# Patient Record
Sex: Female | Born: 1950 | Race: Black or African American | Hispanic: No | Smoking: Never smoker
Health system: Southern US, Community
[De-identification: ages and names within clinical notes are randomized; demographics above are authoritative.]

## PROBLEM LIST (undated history)

## (undated) DIAGNOSIS — M1990 Unspecified osteoarthritis, unspecified site: Secondary | ICD-10-CM

## (undated) DIAGNOSIS — R251 Tremor, unspecified: Secondary | ICD-10-CM

## (undated) DIAGNOSIS — N189 Chronic kidney disease, unspecified: Secondary | ICD-10-CM

## (undated) DIAGNOSIS — I10 Essential (primary) hypertension: Secondary | ICD-10-CM

## (undated) HISTORY — PX: VAGINAL HYSTERECTOMY: SUR661

## (undated) HISTORY — DX: Unspecified osteoarthritis, unspecified site: M19.90

## (undated) HISTORY — DX: Chronic kidney disease, unspecified: N18.9

## (undated) HISTORY — PX: JOINT REPLACEMENT: SHX530

## (undated) HISTORY — PX: REPLACEMENT UNICONDYLAR JOINT KNEE: SUR1227

## (undated) HISTORY — PX: FOOT SURGERY: SHX648

## (undated) HISTORY — PX: CARPAL TUNNEL RELEASE: SHX101

## (undated) HISTORY — DX: Tremor, unspecified: R25.1

## (undated) HISTORY — DX: Essential (primary) hypertension: I10

---

## 1997-07-03 ENCOUNTER — Ambulatory Visit (HOSPITAL_COMMUNITY): Admission: RE | Admit: 1997-07-03 | Discharge: 1997-07-03 | Payer: Self-pay | Admitting: Unknown Physician Specialty

## 1997-12-27 ENCOUNTER — Inpatient Hospital Stay (HOSPITAL_COMMUNITY): Admission: AD | Admit: 1997-12-27 | Discharge: 1997-12-28 | Payer: Self-pay | Admitting: Cardiology

## 2008-05-01 ENCOUNTER — Ambulatory Visit: Payer: Self-pay | Admitting: Pulmonary Disease

## 2008-05-01 ENCOUNTER — Ambulatory Visit: Payer: Self-pay | Admitting: Internal Medicine

## 2008-05-01 ENCOUNTER — Inpatient Hospital Stay (HOSPITAL_COMMUNITY): Admission: AD | Admit: 2008-05-01 | Discharge: 2008-05-05 | Payer: Self-pay | Admitting: Pulmonary Disease

## 2008-05-02 ENCOUNTER — Ambulatory Visit: Payer: Self-pay | Admitting: *Deleted

## 2010-04-30 LAB — RETICULOCYTES
RBC.: 2.95 MIL/uL — ABNORMAL LOW (ref 3.87–5.11)
RBC.: 2.96 MIL/uL — ABNORMAL LOW (ref 3.87–5.11)
Retic Count, Absolute: 29.5 10*3/uL (ref 19.0–186.0)
Retic Count, Absolute: 44.4 10*3/uL (ref 19.0–186.0)
Retic Ct Pct: 1 % (ref 0.4–3.1)
Retic Ct Pct: 1.5 % (ref 0.4–3.1)

## 2010-04-30 LAB — CBC
HCT: 23.3 % — ABNORMAL LOW (ref 36.0–46.0)
HCT: 23.6 % — ABNORMAL LOW (ref 36.0–46.0)
HCT: 25.8 % — ABNORMAL LOW (ref 36.0–46.0)
HCT: 26 % — ABNORMAL LOW (ref 36.0–46.0)
HCT: 26 % — ABNORMAL LOW (ref 36.0–46.0)
Hemoglobin: 8.3 g/dL — ABNORMAL LOW (ref 12.0–15.0)
Hemoglobin: 8.4 g/dL — ABNORMAL LOW (ref 12.0–15.0)
Hemoglobin: 9 g/dL — ABNORMAL LOW (ref 12.0–15.0)
Hemoglobin: 9.3 g/dL — ABNORMAL LOW (ref 12.0–15.0)
Hemoglobin: 9.3 g/dL — ABNORMAL LOW (ref 12.0–15.0)
MCHC: 35.5 g/dL (ref 30.0–36.0)
MCHC: 35.6 g/dL (ref 30.0–36.0)
MCHC: 35 g/dL (ref 30.0–36.0)
MCHC: 35.6 g/dL (ref 30.0–36.0)
MCHC: 35.7 g/dL (ref 30.0–36.0)
MCV: 92.4 fL (ref 78.0–100.0)
MCV: 92.7 fL (ref 78.0–100.0)
MCV: 92.1 fL (ref 78.0–100.0)
MCV: 92.7 fL (ref 78.0–100.0)
MCV: 93.1 fL (ref 78.0–100.0)
Platelets: 156 10*3/uL (ref 150–400)
Platelets: 245 10*3/uL (ref 150–400)
Platelets: 22 10*3/uL — CL (ref 150–400)
Platelets: 60 10*3/uL — ABNORMAL LOW (ref 150–400)
Platelets: 96 10*3/uL — ABNORMAL LOW (ref 150–400)
RBC: 2.53 MIL/uL — ABNORMAL LOW (ref 3.87–5.11)
RBC: 2.55 MIL/uL — ABNORMAL LOW (ref 3.87–5.11)
RBC: 2.77 MIL/uL — ABNORMAL LOW (ref 3.87–5.11)
RBC: 2.8 MIL/uL — ABNORMAL LOW (ref 3.87–5.11)
RBC: 2.83 MIL/uL — ABNORMAL LOW (ref 3.87–5.11)
RDW: 13.7 % (ref 11.5–15.5)
RDW: 14.3 % (ref 11.5–15.5)
RDW: 13.8 % (ref 11.5–15.5)
RDW: 13.9 % (ref 11.5–15.5)
RDW: 14.2 % (ref 11.5–15.5)
WBC: 10.6 10*3/uL — ABNORMAL HIGH (ref 4.0–10.5)
WBC: 10.8 10*3/uL — ABNORMAL HIGH (ref 4.0–10.5)
WBC: 11.6 10*3/uL — ABNORMAL HIGH (ref 4.0–10.5)
WBC: 11.7 10*3/uL — ABNORMAL HIGH (ref 4.0–10.5)
WBC: 12 10*3/uL — ABNORMAL HIGH (ref 4.0–10.5)

## 2010-04-30 LAB — COMPREHENSIVE METABOLIC PANEL
ALT: 18 U/L (ref 0–35)
ALT: 27 U/L (ref 0–35)
ALT: 19 U/L (ref 0–35)
ALT: 26 U/L (ref 0–35)
ALT: 28 U/L (ref 0–35)
AST: 21 U/L (ref 0–37)
AST: 38 U/L — ABNORMAL HIGH (ref 0–37)
AST: 32 U/L (ref 0–37)
AST: 53 U/L — ABNORMAL HIGH (ref 0–37)
AST: 55 U/L — ABNORMAL HIGH (ref 0–37)
Albumin: 1.9 g/dL — ABNORMAL LOW (ref 3.5–5.2)
Albumin: 1.9 g/dL — ABNORMAL LOW (ref 3.5–5.2)
Albumin: 1.8 g/dL — ABNORMAL LOW (ref 3.5–5.2)
Albumin: 1.9 g/dL — ABNORMAL LOW (ref 3.5–5.2)
Albumin: 2.1 g/dL — ABNORMAL LOW (ref 3.5–5.2)
Alkaline Phosphatase: 150 U/L — ABNORMAL HIGH (ref 39–117)
Alkaline Phosphatase: 156 U/L — ABNORMAL HIGH (ref 39–117)
Alkaline Phosphatase: 166 U/L — ABNORMAL HIGH (ref 39–117)
Alkaline Phosphatase: 176 U/L — ABNORMAL HIGH (ref 39–117)
Alkaline Phosphatase: 65 U/L (ref 39–117)
BUN: 21 mg/dL (ref 6–23)
BUN: 9 mg/dL (ref 6–23)
BUN: 40 mg/dL — ABNORMAL HIGH (ref 6–23)
BUN: 62 mg/dL — ABNORMAL HIGH (ref 6–23)
BUN: 69 mg/dL — ABNORMAL HIGH (ref 6–23)
CO2: 20 mEq/L (ref 19–32)
CO2: 23 mEq/L (ref 19–32)
CO2: 19 mEq/L (ref 19–32)
CO2: 19 mEq/L (ref 19–32)
CO2: 19 mEq/L (ref 19–32)
Calcium: 8.1 mg/dL — ABNORMAL LOW (ref 8.4–10.5)
Calcium: 8.1 mg/dL — ABNORMAL LOW (ref 8.4–10.5)
Calcium: 6.9 mg/dL — ABNORMAL LOW (ref 8.4–10.5)
Calcium: 7.6 mg/dL — ABNORMAL LOW (ref 8.4–10.5)
Calcium: 8.7 mg/dL (ref 8.4–10.5)
Chloride: 111 mEq/L (ref 96–112)
Chloride: 112 mEq/L (ref 96–112)
Chloride: 104 mEq/L (ref 96–112)
Chloride: 112 mEq/L (ref 96–112)
Chloride: 114 mEq/L — ABNORMAL HIGH (ref 96–112)
Creatinine, Ser: 1.26 mg/dL — ABNORMAL HIGH (ref 0.4–1.2)
Creatinine, Ser: 1.73 mg/dL — ABNORMAL HIGH (ref 0.4–1.2)
Creatinine, Ser: 2.87 mg/dL — ABNORMAL HIGH (ref 0.4–1.2)
Creatinine, Ser: 4.77 mg/dL — ABNORMAL HIGH (ref 0.4–1.2)
Creatinine, Ser: 5.44 mg/dL — ABNORMAL HIGH (ref 0.4–1.2)
GFR calc Af Amer: 36 mL/min — ABNORMAL LOW (ref >60)
GFR calc Af Amer: 53 mL/min — ABNORMAL LOW (ref >60)
GFR calc Af Amer: 10 mL/min — ABNORMAL LOW (ref >60)
GFR calc Af Amer: 11 mL/min — ABNORMAL LOW (ref >60)
GFR calc Af Amer: 20 mL/min — ABNORMAL LOW (ref >60)
GFR calc non Af Amer: 30 mL/min — ABNORMAL LOW (ref >60)
GFR calc non Af Amer: 43 mL/min — ABNORMAL LOW (ref >60)
GFR calc non Af Amer: 17 mL/min — ABNORMAL LOW (ref >60)
GFR calc non Af Amer: 8 mL/min — ABNORMAL LOW (ref >60)
GFR calc non Af Amer: 9 mL/min — ABNORMAL LOW (ref >60)
Glucose, Bld: 123 mg/dL — ABNORMAL HIGH (ref 70–99)
Glucose, Bld: 97 mg/dL (ref 70–99)
Glucose, Bld: 107 mg/dL — ABNORMAL HIGH (ref 70–99)
Glucose, Bld: 124 mg/dL — ABNORMAL HIGH (ref 70–99)
Glucose, Bld: 88 mg/dL (ref 70–99)
Potassium: 3.1 mEq/L — ABNORMAL LOW (ref 3.5–5.1)
Potassium: 3.2 mEq/L — ABNORMAL LOW (ref 3.5–5.1)
Potassium: 3.5 mEq/L (ref 3.5–5.1)
Potassium: 3.7 mEq/L (ref 3.5–5.1)
Potassium: 3.7 mEq/L (ref 3.5–5.1)
Sodium: 142 mEq/L (ref 135–145)
Sodium: 144 mEq/L (ref 135–145)
Sodium: 132 mEq/L — ABNORMAL LOW (ref 135–145)
Sodium: 138 mEq/L (ref 135–145)
Sodium: 143 mEq/L (ref 135–145)
Total Bilirubin: 1.1 mg/dL (ref 0.3–1.2)
Total Bilirubin: 1.5 mg/dL — ABNORMAL HIGH (ref 0.3–1.2)
Total Bilirubin: 1.3 mg/dL — ABNORMAL HIGH (ref 0.3–1.2)
Total Bilirubin: 2.1 mg/dL — ABNORMAL HIGH (ref 0.3–1.2)
Total Bilirubin: 2.4 mg/dL — ABNORMAL HIGH (ref 0.3–1.2)
Total Protein: 5.4 g/dL — ABNORMAL LOW (ref 6.0–8.3)
Total Protein: 5.5 g/dL — ABNORMAL LOW (ref 6.0–8.3)
Total Protein: 5 g/dL — ABNORMAL LOW (ref 6.0–8.3)
Total Protein: 5 g/dL — ABNORMAL LOW (ref 6.0–8.3)
Total Protein: 5.2 g/dL — ABNORMAL LOW (ref 6.0–8.3)

## 2010-04-30 LAB — CULTURE, BLOOD (ROUTINE X 2)
Culture: NO GROWTH
Culture: NO GROWTH

## 2010-04-30 LAB — IRON AND TIBC
Iron: 11 ug/dL — ABNORMAL LOW (ref 42–135)
Iron: 23 ug/dL — ABNORMAL LOW (ref 42–135)
Saturation Ratios: 10 % — ABNORMAL LOW (ref 20–55)
Saturation Ratios: 6 % — ABNORMAL LOW (ref 20–55)
TIBC: 196 ug/dL — ABNORMAL LOW (ref 250–470)
TIBC: 223 ug/dL — ABNORMAL LOW (ref 250–470)
UIBC: 185 ug/dL
UIBC: 200 ug/dL

## 2010-04-30 LAB — PROTIME-INR
INR: 1.3 (ref 0.00–1.49)
Prothrombin Time: 16.7 seconds — ABNORMAL HIGH (ref 11.6–15.2)

## 2010-04-30 LAB — URINALYSIS, MICROSCOPIC ONLY
Glucose, UA: NEGATIVE mg/dL
Ketones, ur: 15 mg/dL — AB
Nitrite: NEGATIVE
Protein, ur: 100 mg/dL — AB
Specific Gravity, Urine: 1.02 (ref 1.005–1.030)
Urobilinogen, UA: 1 mg/dL (ref 0.0–1.0)
pH: 5 (ref 5.0–8.0)

## 2010-04-30 LAB — GLUCOSE, CAPILLARY
Glucose-Capillary: 88 mg/dL (ref 70–99)
Glucose-Capillary: 94 mg/dL (ref 70–99)
Glucose-Capillary: 95 mg/dL (ref 70–99)
Glucose-Capillary: 107 mg/dL — ABNORMAL HIGH (ref 70–99)
Glucose-Capillary: 110 mg/dL — ABNORMAL HIGH (ref 70–99)
Glucose-Capillary: 114 mg/dL — ABNORMAL HIGH (ref 70–99)
Glucose-Capillary: 117 mg/dL — ABNORMAL HIGH (ref 70–99)
Glucose-Capillary: 118 mg/dL — ABNORMAL HIGH (ref 70–99)
Glucose-Capillary: 120 mg/dL — ABNORMAL HIGH (ref 70–99)
Glucose-Capillary: 122 mg/dL — ABNORMAL HIGH (ref 70–99)
Glucose-Capillary: 126 mg/dL — ABNORMAL HIGH (ref 70–99)
Glucose-Capillary: 76 mg/dL (ref 70–99)
Glucose-Capillary: 81 mg/dL (ref 70–99)
Glucose-Capillary: 85 mg/dL (ref 70–99)
Glucose-Capillary: 87 mg/dL (ref 70–99)
Glucose-Capillary: 91 mg/dL (ref 70–99)
Glucose-Capillary: 92 mg/dL (ref 70–99)
Glucose-Capillary: 97 mg/dL (ref 70–99)
Glucose-Capillary: 99 mg/dL (ref 70–99)

## 2010-04-30 LAB — DIFFERENTIAL
Basophils Absolute: 0 10*3/uL (ref 0.0–0.1)
Basophils Absolute: 0 10*3/uL (ref 0.0–0.1)
Basophils Relative: 0 % (ref 0–1)
Basophils Relative: 0 % (ref 0–1)
Eosinophils Absolute: 0 10*3/uL (ref 0.0–0.7)
Eosinophils Absolute: 0 10*3/uL (ref 0.0–0.7)
Eosinophils Relative: 0 % (ref 0–5)
Eosinophils Relative: 0 % (ref 0–5)
Lymphocytes Relative: 5 % — ABNORMAL LOW (ref 12–46)
Lymphocytes Relative: 9 % — ABNORMAL LOW (ref 12–46)
Lymphs Abs: 0.6 10*3/uL — ABNORMAL LOW (ref 0.7–4.0)
Lymphs Abs: 1 10*3/uL (ref 0.7–4.0)
Monocytes Absolute: 1.5 10*3/uL — ABNORMAL HIGH (ref 0.1–1.0)
Monocytes Absolute: 2.4 10*3/uL — ABNORMAL HIGH (ref 0.1–1.0)
Monocytes Relative: 13 % — ABNORMAL HIGH (ref 3–12)
Monocytes Relative: 20 % — ABNORMAL HIGH (ref 3–12)
Neutro Abs: 9 10*3/uL — ABNORMAL HIGH (ref 1.7–7.7)
Neutro Abs: 9.1 10*3/uL — ABNORMAL HIGH (ref 1.7–7.7)
Neutrophils Relative %: 75 % (ref 43–77)
Neutrophils Relative %: 78 % — ABNORMAL HIGH (ref 43–77)

## 2010-04-30 LAB — CARDIAC PANEL(CRET KIN+CKTOT+MB+TROPI)
CK, MB: 0.9 ng/mL (ref 0.3–4.0)
CK, MB: 1.6 ng/mL (ref 0.3–4.0)
CK, MB: 2.4 ng/mL (ref 0.3–4.0)
CK, MB: 2.6 ng/mL (ref 0.3–4.0)
Relative Index: 1.3 (ref 0.0–2.5)
Relative Index: 1.3 (ref 0.0–2.5)
Relative Index: 1.5 (ref 0.0–2.5)
Relative Index: INVALID (ref 0.0–2.5)
Total CK: 122 U/L (ref 7–177)
Total CK: 164 U/L (ref 7–177)
Total CK: 207 U/L — ABNORMAL HIGH (ref 7–177)
Total CK: 82 U/L (ref 7–177)
Troponin I: 0.01 ng/mL (ref 0.00–0.06)
Troponin I: 0.02 ng/mL (ref 0.00–0.06)
Troponin I: 0.03 ng/mL (ref 0.00–0.06)
Troponin I: <0.01 ng/mL (ref 0.00–0.06)

## 2010-04-30 LAB — FOLATE
Folate: 12.9 ng/mL
Folate: >20 ng/mL

## 2010-04-30 LAB — URINE CULTURE
Colony Count: NO GROWTH
Culture: NO GROWTH

## 2010-04-30 LAB — LIPID PANEL
Cholesterol: 155 mg/dL (ref 0–200)
HDL: 55 mg/dL (ref >39)
LDL Cholesterol: 78 mg/dL (ref 0–99)
Total CHOL/HDL Ratio: 2.8 RATIO
Triglycerides: 111 mg/dL (ref <150)
VLDL: 22 mg/dL (ref 0–40)

## 2010-04-30 LAB — TRIGLYCERIDES: Triglycerides: 115 mg/dL (ref <150)

## 2010-04-30 LAB — HIV ANTIBODY (ROUTINE TESTING W REFLEX): HIV: NONREACTIVE

## 2010-04-30 LAB — AMYLASE
Amylase: 182 U/L — ABNORMAL HIGH (ref 27–131)
Amylase: 393 U/L — ABNORMAL HIGH (ref 27–131)

## 2010-04-30 LAB — RAPID URINE DRUG SCREEN, HOSP PERFORMED
Amphetamines: NOT DETECTED
Barbiturates: NOT DETECTED
Benzodiazepines: NOT DETECTED
Cocaine: NOT DETECTED
Opiates: POSITIVE — AB
Tetrahydrocannabinol: NOT DETECTED

## 2010-04-30 LAB — TECHNOLOGIST SMEAR REVIEW

## 2010-04-30 LAB — PATHOLOGIST SMEAR REVIEW

## 2010-04-30 LAB — HEMOGLOBIN A1C
Hgb A1c MFr Bld: 5.6 % (ref 4.6–6.1)
Mean Plasma Glucose: 114 mg/dL

## 2010-04-30 LAB — FERRITIN
Ferritin: 1809 ng/mL — ABNORMAL HIGH (ref 10–291)
Ferritin: 868 ng/mL — ABNORMAL HIGH (ref 10–291)

## 2010-04-30 LAB — LIPASE, BLOOD
Lipase: 39 U/L (ref 11–59)
Lipase: 72 U/L — ABNORMAL HIGH (ref 11–59)

## 2010-04-30 LAB — LACTATE DEHYDROGENASE
LDH: 488 U/L — ABNORMAL HIGH (ref 94–250)
LDH: 1563 U/L — ABNORMAL HIGH (ref 94–250)

## 2010-04-30 LAB — PHOSPHORUS: Phosphorus: 4.3 mg/dL (ref 2.3–4.6)

## 2010-04-30 LAB — HAPTOGLOBIN
Haptoglobin: 284 mg/dL — ABNORMAL HIGH (ref 16–200)
Haptoglobin: 27 mg/dL (ref 16–200)

## 2010-04-30 LAB — ETHANOL: Alcohol, Ethyl (B): <5 mg/dL (ref 0–10)

## 2010-04-30 LAB — VITAMIN B12
Vitamin B-12: 1255 pg/mL — ABNORMAL HIGH (ref 211–911)
Vitamin B-12: 497 pg/mL (ref 211–911)

## 2010-04-30 LAB — APTT: aPTT: 43 seconds — ABNORMAL HIGH (ref 24–37)

## 2010-04-30 LAB — LACTIC ACID, PLASMA: Lactic Acid, Venous: 0.7 mmol/L (ref 0.5–2.2)

## 2010-04-30 LAB — MAGNESIUM: Magnesium: 2.1 mg/dL (ref 1.5–2.5)

## 2010-06-03 NOTE — Discharge Summary (Signed)
NAME:  Sharon Wise, Sharon Wise NO.:  [id number]   MEDICAL RECORD NO.:  [id number]          PATIENT TYPE:  INP   LOCATION:  5523                         FACILITY:  [facility]   PHYSICIAN:  Felicity Pellegrini, MD     DATE OF BIRTH:  [birth year]   DATE OF ADMISSION:  05/01/2008  DATE OF DISCHARGE:                               DISCHARGE SUMMARY   DISCHARGE DIAGNOSES:  1. Alcohol-induced acute pancreatitis, resolved.  2. Disseminated intervascular coagulation secondary to problem number      1, resolving.  3. Acute renal failure secondary to prerenal etiology from volume      depletion, resolving.  4. Hypotension secondary to volume depletion, resolved with      intravenous hydration.  5. Hypokalemia, resolved.  6. Alcohol abuse.  7. History of hypertension.  8. Gastroesophageal reflux disease.  9. Anxiety.   DISCHARGE MEDICATIONS:  1. Protonix 40 mg p.o. daily.  2. Ambien 10 mg p.o. nightly.  3. Klonopin 0.5 mg p.o. once daily.  4. Xanax 0.25 mg p.o. b.i.d. as needed.  5. Percocet 5/325 mg 1-2 tablets p.o. q.4 h. p.r.n. for pain.  6. Thiamine 100 mg p.o. been p.o. daily.  7. Folate 1 mg p.o. daily.   The patient was instructed to discontinue the use of her Exforge upon  discharge from the hospital.   Ulysses:  The patient was discharged home in stable  condition.  Her abdominal pain was much improved and she was tolerating  oral intake well.  The patient is to follow up with her primary care  Demontre Padin, Dr. Truman Hayward on May 09, 2008 at 3:15 p.m.  At that time, she  should be assessed about continued resolution of her pancreatitis.  A  basic metabolic panel should be checked to reassess her kidney function  and to assure that her creatinine returns back to baseline and also to  reassess her potassium to make sure she is not persistently hypokalemic.  Furthermore, because the patient had DIC secondary to her pancreatitis,  a CBC should be rechecked on her  followup visit to assure that her  platelets continued to improve and her hemoglobin remains stable.  The  patient had blood and urine cultures that were drawn on this admission  and there were negative by the time of discharge; however, the final  results of the culture data should be followed up on as an outpatient.  Lastly, the patient was offered referral for AA meetings for her alcohol  abuse problem during this admission by the social worker; however, she  declined at the moment, but was encouraged to drink no more than 12  beers per week.  During her followup visit with her primary care  Special Ranes, the patient should be reassessed about cutting back on her  alcohol use and another offer to provide further cessation counseling  should be made.   PROCEDURES PERFORMED:  1. A portable chest x-ray performed on May 01, 2008 showed      cardiomegaly and bibasilar atelectasis, but no acute processes were      noted.  2. An abdominal ultrasound  performed on May 01, 2008 showed limited      examination due to bowel gas pattern, but normal-appearing      gallbladder without gallstones.  There were bilateral pleural      effusions.  There was a small amount of ascites.  Nonvisualized      abdominal aorta and left kidney and poorly-visualized pancreas.      Right kidney was without any abnormalities and liver was      unremarkable.  3. A CT of the abdomen and pelvis without contrast performed on May 01, 2008 at outside hospital showed moderate-to-marked pancreatitis      with diffuse pancreatic enlargement and peripancreatic edema.  No      focal fluid collection or pancreatic ductal dilatation was noted.      There was mild prominence of the gallbladder without calcified      stones noted.  There was no evidence for acute cholecystitis or      biliary ductal dilatation.  The patient had minimal ascites in the      right pericolic gutter.  There was no acute pelvic process       identified.  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   CONSULTATIONS:  Dr. Silvano Rusk from Gastroenterology.   BRIEF ADMITTING HISTORY AND PHYSICAL:  The patient is a 60 year old  African American female with past medical history of hypertension,  anxiety, alcohol abuse who presented to the [facility] Emergency  Department with 3-day history of weakness and progressively worsening  abdominal pain.  Pain started while she was at work.  It is described as  sharp, epigastric location with radiation to the back.  She had several  episodes of nausea and vomiting as well as diarrhea per day, which was  nonbloody.  Her vomiting and diarrhea got progressively worse as time  went on.  The patient states that food aggravated her pain and states  that by the time she presented to the emergency department, she could  hardly keep anything down.  About 4 or 5 years ago, the patient had a  similar episode of abdominal pain, which was spontaneously resolved over  a few days and the patient did not seek any medical attention at that  time.  The patient admits to some recent chills, but no fevers, chest  pain, shortness of breath, cough, dysuria.  A CT scan of abdomen and  pelvis was performed at the [facility], which showed  pancreatitis with a mildly prominent gallbladder without ductal  dilatation or inflammation.  Lipase was drawn and was found to be 1100  with a white blood cell count of 14, total bilirubin of 2.4, AST of 106  upon presentation.  The patient was found to be hypotensive with  systolic blood pressures in the 50s to 60s and was given 4 L of normal  saline with only modest improvement in her blood pressure.  Therefore,  she was transferred to [facility] ICU Team on March 31, 2008  for further evaluation and management.  The patient was then later  transferred to the [facility] from the ICU for  continued management  of her pancreatitis after her acute issues had  stabilized.   On presentation, the patient had a temperature of 100.8, blood pressure  between [id number] systolic and [id number] diastolic, pulse between 92 and 111,  oxygen saturation of 99% 2  L ischemia.  In general, she was in no acute  distress.  Cardiovascular exam revealed regular rhythm, but  tachycardiac.  No murmurs, rubs, or gallops were noted.  Respiratory  exam was clear to auscultation bilaterally.  Abdomen exam showed  positive bowel sounds and a soft, nondistended abdomen with mild-to-  moderate tenderness to palpation in the epigastric area without rebound  or guarding.  No masses were noted.  Extremity exam showed no clubbing,  cyanosis, or edema.   Laboratory data on presentation at outside hospital are as follows;  white blood cell count 14, hemoglobin 10.9, MCV 93, platelets 19,000.  Sodium 131, potassium 4.0, chloride 97, bicarb 24, BUN 79, creatinine  5.95, glucose 157, magnesium 2.1, phosphorus 4.3, total bilirubin 2.4,  alkaline phosphatase 114, AST 106, ALT 35, LDH was 8600, lipase was  1100, haptoglobin was 27.  PT was 16.7, INR 1.3, PTT was 43.  Cardiac  enzymes were negative x1.  Total cholesterol was 115, triglycerides 111,  HDL 55, LDL 98.  Urine drug screen was positive for opiates, hemoglobin  A1c was 5.6.  Serum iron was 23, TIBC was 223, percent saturation was  10, ferritin was 1800.  B12 was 499.   HOSPITAL COURSE BY PROBLEM:  1. Acute pancreatitis.  The etiology of pancreatitis was thought      secondary to excessive alcohol intake.  The patient at first denied      drinking more than 6 beers per week.  However, because her      triglycerides were within normal limits and CT and ultrasound of      the abdomen showed no signs of gallbladder etiology.  There was      concern that the patient had been drinking more than we were told      initially.  On further questioning, the patient admitted to having      up  to 10 or 12 beers per day and this was confirmed by family      members who were present during the interview.  Standard      pancreatitis management protocol was initiated including keeping      the patient n.p.o. and re-hydrating with IV fluids and controlling      her pain with IV narcotics.  A GI consult was obtained.  However,      no specific intervention was performed during this admission per      them.  The patient showed continued clinical improvement with bowel      rest as far as her abdominal pain was concern and her lipase      trended back down to normal by day of discharge.  She was      transitioned off of IV narcotics to oral pain medication throughout      her hospital course and was tolerating oral intake of fluids and      soft solids well by time of discharge.  Although the patient had an      elevated white blood cell count and a low-grade fever on admission      to the hospital.  Blood cultures and urine cultures remained      negative to date and the patient was not started on any      antibiotics.  She was afebrile for greater than 24 hours and white      cell count had returned to within normal limits by time of      discharge.  There was  no evidence on CT scan for a pancreatic      pseudocyst or abscess.  The patient was discharged home in stable      condition with marked improvement of her abdominal pain and was      tolerating oral intake well.  No further interventions or      evaluation for her pancreatitis were performed and the patient was      instructed to advance her diet slowly for the next several days      postdischarge.  2. Disseminated intravascular coagulation.  On admission, the patient      was noted to be anemic, with an elevated LDH, low normal      haptoglobin, and increased coagulation time with a PT of 16.7 and a      PTT of 43.  Her labs indicated a acute hemolysis-type picture as      well as activation of the coagulation cascade  especially      considering her total bilirubin was elevated at 2.4 and platelets      were found to be 19,000 on presentation.  A diagnosis of      disseminated intravascular coagulation was made most likely      secondary to the patient's acute pancreatitis.  The patient had no      overt signs of bleeding and her platelets continued to show marked      improvement throughout her hospitalization without any specific      intervention for her disseminated intravascular coagulation except      for IV hydration.  The patient will need to have a repeat CBC      checked by her primary Claudia Greenley upon her followup visit to assure      that her thrombocytopenia resolves completely and her hemoglobin      holds steady.  A LDH was rechecked prior to her discharge and was      found to be significantly lower indicating that an acute phase had      resolved.  Her total bilirubin was also decreased as well prior to      her discharge.  3. Hypotension.  The patient was reportedly having systolic blood      pressures in the 50s and 60s range when she presented to the      [facility] Emergency Department.  Her hypotension was thought      most likely secondary to volume depletion from her recent episodes      of diarrhea and vomiting.  Because she had a mildly elevated      temperature.  There was some concern for sepsis-type picture;      however, she was extremely responsive to IV fluid boluses and her      blood pressure upon transfer to the [facility] ICU Team was much      improved.  Blood and urine cultures were obtained; however, no      antibiotics were started and by the time of discharge, her culture      data had not grown out any organisms.  The patient had a set of      cardiac enzymes drawn as well as an EKG which showed no significant      abnormalities.  Her blood pressure medication was held upon      admission because of her low blood pressures and considering her      acute  renal failure, the patient was instructed to discontinue the  use of her Exforge upon discharge until her acute renal issues      resolved even though her blood pressures were starting to keep up      into be hypertensive range of 150s by day of discharge.  The      patient did not require the use of any pressors to maintain      adequate blood pressure and perfusion during this hospitalization.  4. Acute renal failure.  The patient was noted to have a creatinine of      5.95 on presentation and a normal creatinine based on previous      records from her primary care Emmons Toth within the last 6-9 months.      Her acute renal failure was thought secondary to prerenal etiology      from volume depletion secondary to her pancreatitis and after she      was adequately hydrated with IV fluids, her creatinine showed a      strong decreasing trend by day of discharge.  The patient should      have a followup basic metabolic panel checked by her PCP to assure      continued resolution of her acute renal failure.  5. Alcohol abuse.  The patient was started on a CIWA protocol to      monitor during this admission and given IV thiamine and folic acid.      Social work consult was obtained for substance abuse counseling and      the patient was appreciative, but denied referral to alcoholic      anonymous per their note.  The patient did say that she would try      to cut back to no more than 12 beers per week upon discharging the      hospital.  During her followup visit with her primary care      Jeremian Whitby, the patient should be reassessed about her progress      towards cessation and she should be offered further counseling and      referrals to appropriate community resources should be made if the      patient decides to seek further help.   DISCHARGE LABS AND VITALS:  On day of discharge, the patient had a  temperature of 98.8, blood pressure of 154/88, pulse of 98, respirations  20, oxygen  saturation of 95-98% on room air.  White blood cell count was  10.6, hemoglobin 8.4, MCV 92.7, platelets 156.  Sodium 142, potassium  3.1, chloride 112, bicarb 20, glucose 97, BUN 21, creatinine 1.73, total  bilirubin 1.5, alk phos 150, AST 21, ALT 18, total protein 5.4, albumin  1.9, calcium 8.1, LDH was 488.  HIV was nonreactive.  Anemia panel showed vitamin B12 of 1200 and a ferritin of 868.  The remainder of the  anemia panel was pending.  A peripheral smear showed polychromasia,  spherocytes, Dohle bodies, schistocyte.  Lipase was 39, total  cholesterol was 155, triglycerides 111, HDL 55, LDL 78, hemoglobin A1c  was 5.6.  Serum alcohol level was less than 5.  Lactic acid level was  0.7.  Blood and urine cultures showed no growth to date.      Stephanie Coup, MD  Electronically Signed      Felicity Pellegrini, MD  Electronically Signed    RK/MEDQ  D:  05/04/2008  T:  05/05/2008  Job:  [id number]   cc:   Cher Nakai

## 2010-06-03 NOTE — Consult Note (Signed)
NAME:  Sharon Wise, Sharon Wise NO.:  [id number]   MEDICAL RECORD NO.:  [id number]          PATIENT TYPE:  INP   LOCATION:  2915                         FACILITY:  [facility]   PHYSICIAN:  Gatha Mayer, MD,FACGDATE OF BIRTH:  April 17, 1950   DATE OF CONSULTATION:  05/01/2008  DATE OF DISCHARGE:                                 CONSULTATION   REQUESTING PHYSICIAN:  Providence Lanius, MD   REASON FOR CONSULTATION:  Acute pancreatitis.   ASSESSMENT:  Severe acute pancreatitis complicated by profound  hypovolemia, acute renal insufficiency, and anemia.  Etiology is not  clear could be gallstones.   RECOMMENDATIONS AND PLAN:  Continue aggressive supportive care.  We will  increase her IV fluids.  Ultrasound to look for gallstones.  If she had  these, a cholecystectomy would be indicated at some point.  Make sure we  check triglycerides as well.  Further plans pending clinical course.   HISTORY:  This is a 60 year old African American woman who became ill  approximately 4 days prior to admission with diffuse abdominal pain  eventually radiated to the back associated with nausea, vomiting, and  weakness.  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  She had unbearable pain the day before  admission with nausea and vomiting, epigastric pain radiating to the  back, presented to [facility] Emergency Department hypotensive and hypoxic  with a blood pressure systolic in the [id number].  She was given 4 L of IV  fluids, but did not have much improvement in her blood pressure and was  transferred here.  I have reviewed the [facility] records as  well.  She is feeling a little better, but not much since being  transferred here.  Currently on 1 mg of morphine every 2 hours and says  that that is not a high enough dose to relieve the pain at this time.  She has not vomited since she has been here that I am aware of.   PAST MEDICAL HISTORY:  1.  Hypertension.  2. Muscle spasms.  3. Possible asthma.  4. Gastroesophageal reflux disease with prior esophageal dilation.  5. Colon polyps.  6. Prior total abdominal hysterectomy versus partial, not clear.  7. Prior foot surgery.  8. Anxiety.  9. Insomnia.   MEDICATIONS PRIOR TO ADMISSION:  Alprazolam, Klonopin, tramadol,  albuterol MDI, zolpidem, Exforge, (valsartan, Norvasc), and Protonix.  She is on folic acid, thiamine, NovoLog sliding scale, Protonix IV, and  Ativan p.r.n. as well as morphine here.   FAMILY HISTORY:  Sister has bone cancer.  Father and mother had strokes,  hypertension, and myocardial functions.  A brother has had alcoholic  cirrhosis.   SOCIAL HISTORY:  She lives in [place].  She is divorced.  She is a  Secretary/administrator.  Nonsmoker.  One 12-ounce beer a weak, last was 3 days  prior to admission.  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   REVIEW OF SYSTEMS:  She has lost approximately 20# over 2 years.  She  has had lower extremity pain, muscle  cramps, some chest tightness with  exertion, still has occasional intermittent dysphagia to solid foods.  Urinary output was way off and decreased, but she is starting to make  urine since getting hydrated.  All other systems are reviewed and  negative or as per HPI.   PHYSICAL EXAMINATION:  GENERAL:  A middle-aged black woman in no acute  distress.  VITAL SIGNS:  Temperature 97.7, blood pressure 113/66, respirations 18,  O2 sat 97% on 2 L.  HEENT:  Eyes are anicteric.  Extraocular movements intact.  Mouth moist.  Clear oral mucosa.  Posterior pharynx clear.  NECK:  Supple.  No thyromegaly or mass.  CHEST:  Few crackles at the bases.  Coarse breath sounds, but clear  overall.  HEART:  S1 and S2.  No rubs, murmurs, or gallops.  ABDOMEN:  Distended, quiet, tender diffusely.  It is soft.  There is no  mass or organomegaly.  EXTREMITIES:  Free of cyanosis, clubbing, or edema.  NEUROLOGIC:   Alert and oriented x3.  Moving all extremities well.  PSYCHIATRIC:  Appropriate affect.   LABORATORY DATA:  Coags show ProTime:  INR 1.3, PTT 43.  She had a  lactic acid 0.7.  Sodium 132, creatinine 5.44, total bilirubin 1.3, AST  is 53, otherwise albumin is 2.1, calcium 6.9 with the albumin of 2.1,  lipase was 72.  CBC:  White count 11.6, hemoglobin 9.3, MCV 92.  She  subsequently had an anemia panel with normal folate, normal B12, iron  saturation is 10%, but her ferritin is 1809.  Drug screen showed  positive opiates.  She had been given analgesics at the other emergency  department.  Phosphorus and magnesium are normal.  Amylase 393.  Ethanol  less than 5.  Total creatine kinase 207, but her MB is normal.  Troponin  is 1.3.  Labs in [place] earlier this morning:  White count 14,000,  hemoglobin 10.9, platelets decreased, myoglobin 253.  Lipase 1105.  AST  106, total bilirubin 2.4, corrected calcium 8.7, glucose is 157, sodium  131, BUN 79, creatinine 5.95, troponin was 0.05 there.  Chest x-ray  shows bibasilar atelectasis and cardiomegaly.  I have personally  reviewed the images.  She had a CT of the abdomen and pelvis without  contrast at [facility], which showed moderate to marked  pancreatitis with diffuse edema, enlargement of the pancreas and  peripancreatic area, the stomach with thick wall, mild prominence of the  gallbladder.  It did not describe biliary ductal dilation effect as that  was not present.  CT of the pelvis, small amount of pelvic ascites, left  pelvic kidney, ptotic, bibasilar airspace disease, question pneumonia,  small bilateral pleural effusions.   EKGs, no acute ischemic changes.  She does have some nonspecific T-wave  changes.   I appreciate the opportunity to care for this patient.      Gatha Mayer, MD,FACG  Electronically Signed     CEG/MEDQ  D:  05/01/2008  T:  05/02/2008  Job:  [id number]   cc:   Providence Lanius, MD  [place]

## 2014-09-25 DIAGNOSIS — I10 Essential (primary) hypertension: Secondary | ICD-10-CM | POA: Diagnosis not present

## 2014-09-25 DIAGNOSIS — G309 Alzheimer's disease, unspecified: Secondary | ICD-10-CM | POA: Diagnosis not present

## 2014-09-25 DIAGNOSIS — M5137 Other intervertebral disc degeneration, lumbosacral region: Secondary | ICD-10-CM | POA: Diagnosis not present

## 2014-09-25 DIAGNOSIS — M4807 Spinal stenosis, lumbosacral region: Secondary | ICD-10-CM | POA: Diagnosis not present

## 2014-09-25 DIAGNOSIS — R270 Ataxia, unspecified: Secondary | ICD-10-CM | POA: Diagnosis not present

## 2014-09-25 DIAGNOSIS — M1990 Unspecified osteoarthritis, unspecified site: Secondary | ICD-10-CM | POA: Diagnosis not present

## 2014-10-22 DIAGNOSIS — G309 Alzheimer's disease, unspecified: Secondary | ICD-10-CM | POA: Diagnosis not present

## 2014-10-22 DIAGNOSIS — I10 Essential (primary) hypertension: Secondary | ICD-10-CM | POA: Diagnosis not present

## 2014-10-22 DIAGNOSIS — R270 Ataxia, unspecified: Secondary | ICD-10-CM | POA: Diagnosis not present

## 2014-10-22 DIAGNOSIS — M4807 Spinal stenosis, lumbosacral region: Secondary | ICD-10-CM | POA: Diagnosis not present

## 2014-10-22 DIAGNOSIS — M1990 Unspecified osteoarthritis, unspecified site: Secondary | ICD-10-CM | POA: Diagnosis not present

## 2014-10-22 DIAGNOSIS — M5137 Other intervertebral disc degeneration, lumbosacral region: Secondary | ICD-10-CM | POA: Diagnosis not present

## 2014-10-23 DIAGNOSIS — G309 Alzheimer's disease, unspecified: Secondary | ICD-10-CM | POA: Diagnosis not present

## 2014-10-23 DIAGNOSIS — M5137 Other intervertebral disc degeneration, lumbosacral region: Secondary | ICD-10-CM | POA: Diagnosis not present

## 2014-10-23 DIAGNOSIS — M4807 Spinal stenosis, lumbosacral region: Secondary | ICD-10-CM | POA: Diagnosis not present

## 2014-10-23 DIAGNOSIS — R270 Ataxia, unspecified: Secondary | ICD-10-CM | POA: Diagnosis not present

## 2014-10-23 DIAGNOSIS — I10 Essential (primary) hypertension: Secondary | ICD-10-CM | POA: Diagnosis not present

## 2014-10-23 DIAGNOSIS — M1990 Unspecified osteoarthritis, unspecified site: Secondary | ICD-10-CM | POA: Diagnosis not present

## 2014-10-24 DIAGNOSIS — M1990 Unspecified osteoarthritis, unspecified site: Secondary | ICD-10-CM | POA: Diagnosis not present

## 2014-10-24 DIAGNOSIS — R270 Ataxia, unspecified: Secondary | ICD-10-CM | POA: Diagnosis not present

## 2014-10-24 DIAGNOSIS — M5137 Other intervertebral disc degeneration, lumbosacral region: Secondary | ICD-10-CM | POA: Diagnosis not present

## 2014-10-24 DIAGNOSIS — M4807 Spinal stenosis, lumbosacral region: Secondary | ICD-10-CM | POA: Diagnosis not present

## 2014-10-24 DIAGNOSIS — I10 Essential (primary) hypertension: Secondary | ICD-10-CM | POA: Diagnosis not present

## 2014-10-24 DIAGNOSIS — G309 Alzheimer's disease, unspecified: Secondary | ICD-10-CM | POA: Diagnosis not present

## 2014-10-25 DIAGNOSIS — R270 Ataxia, unspecified: Secondary | ICD-10-CM | POA: Diagnosis not present

## 2014-10-25 DIAGNOSIS — M1990 Unspecified osteoarthritis, unspecified site: Secondary | ICD-10-CM | POA: Diagnosis not present

## 2014-10-25 DIAGNOSIS — M5137 Other intervertebral disc degeneration, lumbosacral region: Secondary | ICD-10-CM | POA: Diagnosis not present

## 2014-10-25 DIAGNOSIS — I10 Essential (primary) hypertension: Secondary | ICD-10-CM | POA: Diagnosis not present

## 2014-10-25 DIAGNOSIS — G309 Alzheimer's disease, unspecified: Secondary | ICD-10-CM | POA: Diagnosis not present

## 2014-10-25 DIAGNOSIS — M4807 Spinal stenosis, lumbosacral region: Secondary | ICD-10-CM | POA: Diagnosis not present

## 2014-10-26 DIAGNOSIS — M4807 Spinal stenosis, lumbosacral region: Secondary | ICD-10-CM | POA: Diagnosis not present

## 2014-10-26 DIAGNOSIS — I10 Essential (primary) hypertension: Secondary | ICD-10-CM | POA: Diagnosis not present

## 2014-10-26 DIAGNOSIS — G309 Alzheimer's disease, unspecified: Secondary | ICD-10-CM | POA: Diagnosis not present

## 2014-10-26 DIAGNOSIS — R270 Ataxia, unspecified: Secondary | ICD-10-CM | POA: Diagnosis not present

## 2014-10-26 DIAGNOSIS — M5137 Other intervertebral disc degeneration, lumbosacral region: Secondary | ICD-10-CM | POA: Diagnosis not present

## 2014-10-26 DIAGNOSIS — M1990 Unspecified osteoarthritis, unspecified site: Secondary | ICD-10-CM | POA: Diagnosis not present

## 2014-10-29 DIAGNOSIS — I10 Essential (primary) hypertension: Secondary | ICD-10-CM | POA: Diagnosis not present

## 2014-10-29 DIAGNOSIS — G309 Alzheimer's disease, unspecified: Secondary | ICD-10-CM | POA: Diagnosis not present

## 2014-10-29 DIAGNOSIS — M4807 Spinal stenosis, lumbosacral region: Secondary | ICD-10-CM | POA: Diagnosis not present

## 2014-10-29 DIAGNOSIS — M5137 Other intervertebral disc degeneration, lumbosacral region: Secondary | ICD-10-CM | POA: Diagnosis not present

## 2014-10-29 DIAGNOSIS — M1990 Unspecified osteoarthritis, unspecified site: Secondary | ICD-10-CM | POA: Diagnosis not present

## 2014-10-29 DIAGNOSIS — R270 Ataxia, unspecified: Secondary | ICD-10-CM | POA: Diagnosis not present

## 2014-10-30 DIAGNOSIS — M5137 Other intervertebral disc degeneration, lumbosacral region: Secondary | ICD-10-CM | POA: Diagnosis not present

## 2014-10-30 DIAGNOSIS — G309 Alzheimer's disease, unspecified: Secondary | ICD-10-CM | POA: Diagnosis not present

## 2014-10-30 DIAGNOSIS — M1990 Unspecified osteoarthritis, unspecified site: Secondary | ICD-10-CM | POA: Diagnosis not present

## 2014-10-30 DIAGNOSIS — M4807 Spinal stenosis, lumbosacral region: Secondary | ICD-10-CM | POA: Diagnosis not present

## 2014-10-30 DIAGNOSIS — R270 Ataxia, unspecified: Secondary | ICD-10-CM | POA: Diagnosis not present

## 2014-10-30 DIAGNOSIS — I10 Essential (primary) hypertension: Secondary | ICD-10-CM | POA: Diagnosis not present

## 2014-10-31 DIAGNOSIS — M1990 Unspecified osteoarthritis, unspecified site: Secondary | ICD-10-CM | POA: Diagnosis not present

## 2014-10-31 DIAGNOSIS — R270 Ataxia, unspecified: Secondary | ICD-10-CM | POA: Diagnosis not present

## 2014-10-31 DIAGNOSIS — G309 Alzheimer's disease, unspecified: Secondary | ICD-10-CM | POA: Diagnosis not present

## 2014-10-31 DIAGNOSIS — I10 Essential (primary) hypertension: Secondary | ICD-10-CM | POA: Diagnosis not present

## 2014-10-31 DIAGNOSIS — M4807 Spinal stenosis, lumbosacral region: Secondary | ICD-10-CM | POA: Diagnosis not present

## 2014-10-31 DIAGNOSIS — M5137 Other intervertebral disc degeneration, lumbosacral region: Secondary | ICD-10-CM | POA: Diagnosis not present

## 2014-11-01 DIAGNOSIS — M1990 Unspecified osteoarthritis, unspecified site: Secondary | ICD-10-CM | POA: Diagnosis not present

## 2014-11-01 DIAGNOSIS — M5137 Other intervertebral disc degeneration, lumbosacral region: Secondary | ICD-10-CM | POA: Diagnosis not present

## 2014-11-01 DIAGNOSIS — R270 Ataxia, unspecified: Secondary | ICD-10-CM | POA: Diagnosis not present

## 2014-11-01 DIAGNOSIS — I10 Essential (primary) hypertension: Secondary | ICD-10-CM | POA: Diagnosis not present

## 2014-11-01 DIAGNOSIS — M4807 Spinal stenosis, lumbosacral region: Secondary | ICD-10-CM | POA: Diagnosis not present

## 2014-11-01 DIAGNOSIS — G309 Alzheimer's disease, unspecified: Secondary | ICD-10-CM | POA: Diagnosis not present

## 2014-11-02 DIAGNOSIS — M4807 Spinal stenosis, lumbosacral region: Secondary | ICD-10-CM | POA: Diagnosis not present

## 2014-11-02 DIAGNOSIS — M5137 Other intervertebral disc degeneration, lumbosacral region: Secondary | ICD-10-CM | POA: Diagnosis not present

## 2014-11-02 DIAGNOSIS — I10 Essential (primary) hypertension: Secondary | ICD-10-CM | POA: Diagnosis not present

## 2014-11-02 DIAGNOSIS — R270 Ataxia, unspecified: Secondary | ICD-10-CM | POA: Diagnosis not present

## 2014-11-02 DIAGNOSIS — M1990 Unspecified osteoarthritis, unspecified site: Secondary | ICD-10-CM | POA: Diagnosis not present

## 2014-11-02 DIAGNOSIS — G309 Alzheimer's disease, unspecified: Secondary | ICD-10-CM | POA: Diagnosis not present

## 2014-11-05 DIAGNOSIS — R270 Ataxia, unspecified: Secondary | ICD-10-CM | POA: Diagnosis not present

## 2014-11-05 DIAGNOSIS — G309 Alzheimer's disease, unspecified: Secondary | ICD-10-CM | POA: Diagnosis not present

## 2014-11-05 DIAGNOSIS — M5137 Other intervertebral disc degeneration, lumbosacral region: Secondary | ICD-10-CM | POA: Diagnosis not present

## 2014-11-05 DIAGNOSIS — M4807 Spinal stenosis, lumbosacral region: Secondary | ICD-10-CM | POA: Diagnosis not present

## 2014-11-05 DIAGNOSIS — M1990 Unspecified osteoarthritis, unspecified site: Secondary | ICD-10-CM | POA: Diagnosis not present

## 2014-11-05 DIAGNOSIS — I10 Essential (primary) hypertension: Secondary | ICD-10-CM | POA: Diagnosis not present

## 2014-11-06 DIAGNOSIS — M1990 Unspecified osteoarthritis, unspecified site: Secondary | ICD-10-CM | POA: Diagnosis not present

## 2014-11-06 DIAGNOSIS — I10 Essential (primary) hypertension: Secondary | ICD-10-CM | POA: Diagnosis not present

## 2014-11-06 DIAGNOSIS — G309 Alzheimer's disease, unspecified: Secondary | ICD-10-CM | POA: Diagnosis not present

## 2014-11-06 DIAGNOSIS — M4807 Spinal stenosis, lumbosacral region: Secondary | ICD-10-CM | POA: Diagnosis not present

## 2014-11-06 DIAGNOSIS — M5137 Other intervertebral disc degeneration, lumbosacral region: Secondary | ICD-10-CM | POA: Diagnosis not present

## 2014-11-06 DIAGNOSIS — R270 Ataxia, unspecified: Secondary | ICD-10-CM | POA: Diagnosis not present

## 2014-11-07 DIAGNOSIS — G309 Alzheimer's disease, unspecified: Secondary | ICD-10-CM | POA: Diagnosis not present

## 2014-11-07 DIAGNOSIS — M1990 Unspecified osteoarthritis, unspecified site: Secondary | ICD-10-CM | POA: Diagnosis not present

## 2014-11-07 DIAGNOSIS — R270 Ataxia, unspecified: Secondary | ICD-10-CM | POA: Diagnosis not present

## 2014-11-07 DIAGNOSIS — I10 Essential (primary) hypertension: Secondary | ICD-10-CM | POA: Diagnosis not present

## 2014-11-07 DIAGNOSIS — M4807 Spinal stenosis, lumbosacral region: Secondary | ICD-10-CM | POA: Diagnosis not present

## 2014-11-07 DIAGNOSIS — M5137 Other intervertebral disc degeneration, lumbosacral region: Secondary | ICD-10-CM | POA: Diagnosis not present

## 2014-11-08 DIAGNOSIS — G309 Alzheimer's disease, unspecified: Secondary | ICD-10-CM | POA: Diagnosis not present

## 2014-11-08 DIAGNOSIS — M5137 Other intervertebral disc degeneration, lumbosacral region: Secondary | ICD-10-CM | POA: Diagnosis not present

## 2014-11-08 DIAGNOSIS — I10 Essential (primary) hypertension: Secondary | ICD-10-CM | POA: Diagnosis not present

## 2014-11-08 DIAGNOSIS — M4807 Spinal stenosis, lumbosacral region: Secondary | ICD-10-CM | POA: Diagnosis not present

## 2014-11-08 DIAGNOSIS — M1990 Unspecified osteoarthritis, unspecified site: Secondary | ICD-10-CM | POA: Diagnosis not present

## 2014-11-08 DIAGNOSIS — R270 Ataxia, unspecified: Secondary | ICD-10-CM | POA: Diagnosis not present

## 2014-11-09 DIAGNOSIS — I10 Essential (primary) hypertension: Secondary | ICD-10-CM | POA: Diagnosis not present

## 2014-11-09 DIAGNOSIS — R270 Ataxia, unspecified: Secondary | ICD-10-CM | POA: Diagnosis not present

## 2014-11-09 DIAGNOSIS — G309 Alzheimer's disease, unspecified: Secondary | ICD-10-CM | POA: Diagnosis not present

## 2014-11-09 DIAGNOSIS — M4807 Spinal stenosis, lumbosacral region: Secondary | ICD-10-CM | POA: Diagnosis not present

## 2014-11-09 DIAGNOSIS — M1990 Unspecified osteoarthritis, unspecified site: Secondary | ICD-10-CM | POA: Diagnosis not present

## 2014-11-09 DIAGNOSIS — M5137 Other intervertebral disc degeneration, lumbosacral region: Secondary | ICD-10-CM | POA: Diagnosis not present

## 2014-11-13 DIAGNOSIS — M4807 Spinal stenosis, lumbosacral region: Secondary | ICD-10-CM | POA: Diagnosis not present

## 2014-11-13 DIAGNOSIS — M5137 Other intervertebral disc degeneration, lumbosacral region: Secondary | ICD-10-CM | POA: Diagnosis not present

## 2014-11-13 DIAGNOSIS — I10 Essential (primary) hypertension: Secondary | ICD-10-CM | POA: Diagnosis not present

## 2014-11-13 DIAGNOSIS — R270 Ataxia, unspecified: Secondary | ICD-10-CM | POA: Diagnosis not present

## 2014-11-13 DIAGNOSIS — G309 Alzheimer's disease, unspecified: Secondary | ICD-10-CM | POA: Diagnosis not present

## 2014-11-13 DIAGNOSIS — M1990 Unspecified osteoarthritis, unspecified site: Secondary | ICD-10-CM | POA: Diagnosis not present

## 2014-11-14 DIAGNOSIS — Z780 Asymptomatic menopausal state: Secondary | ICD-10-CM | POA: Diagnosis not present

## 2014-11-14 DIAGNOSIS — Z79899 Other long term (current) drug therapy: Secondary | ICD-10-CM | POA: Diagnosis not present

## 2014-11-14 DIAGNOSIS — E559 Vitamin D deficiency, unspecified: Secondary | ICD-10-CM | POA: Diagnosis not present

## 2014-11-14 DIAGNOSIS — M2550 Pain in unspecified joint: Secondary | ICD-10-CM | POA: Diagnosis not present

## 2014-11-14 DIAGNOSIS — E785 Hyperlipidemia, unspecified: Secondary | ICD-10-CM | POA: Diagnosis not present

## 2014-11-14 DIAGNOSIS — Z0000 Encounter for general adult medical examination without abnormal findings: Secondary | ICD-10-CM | POA: Diagnosis not present

## 2014-11-14 DIAGNOSIS — I1310 Hypertensive heart and chronic kidney disease without heart failure, with stage 1 through stage 4 chronic kidney disease, or unspecified chronic kidney disease: Secondary | ICD-10-CM | POA: Diagnosis not present

## 2014-11-14 DIAGNOSIS — M109 Gout, unspecified: Secondary | ICD-10-CM | POA: Diagnosis not present

## 2014-11-14 DIAGNOSIS — N183 Chronic kidney disease, stage 3 (moderate): Secondary | ICD-10-CM | POA: Diagnosis not present

## 2014-11-15 DIAGNOSIS — M4807 Spinal stenosis, lumbosacral region: Secondary | ICD-10-CM | POA: Diagnosis not present

## 2014-11-15 DIAGNOSIS — R270 Ataxia, unspecified: Secondary | ICD-10-CM | POA: Diagnosis not present

## 2014-11-15 DIAGNOSIS — M1990 Unspecified osteoarthritis, unspecified site: Secondary | ICD-10-CM | POA: Diagnosis not present

## 2014-11-15 DIAGNOSIS — M5137 Other intervertebral disc degeneration, lumbosacral region: Secondary | ICD-10-CM | POA: Diagnosis not present

## 2014-11-15 DIAGNOSIS — G309 Alzheimer's disease, unspecified: Secondary | ICD-10-CM | POA: Diagnosis not present

## 2014-11-15 DIAGNOSIS — I10 Essential (primary) hypertension: Secondary | ICD-10-CM | POA: Diagnosis not present

## 2014-11-20 DIAGNOSIS — I1310 Hypertensive heart and chronic kidney disease without heart failure, with stage 1 through stage 4 chronic kidney disease, or unspecified chronic kidney disease: Secondary | ICD-10-CM | POA: Diagnosis not present

## 2014-11-20 DIAGNOSIS — N183 Chronic kidney disease, stage 3 (moderate): Secondary | ICD-10-CM | POA: Diagnosis not present

## 2014-11-20 DIAGNOSIS — M2550 Pain in unspecified joint: Secondary | ICD-10-CM | POA: Diagnosis not present

## 2014-11-20 DIAGNOSIS — E785 Hyperlipidemia, unspecified: Secondary | ICD-10-CM | POA: Diagnosis not present

## 2014-11-20 DIAGNOSIS — N179 Acute kidney failure, unspecified: Secondary | ICD-10-CM | POA: Diagnosis not present

## 2014-11-20 DIAGNOSIS — M6281 Muscle weakness (generalized): Secondary | ICD-10-CM | POA: Diagnosis not present

## 2014-11-20 DIAGNOSIS — M109 Gout, unspecified: Secondary | ICD-10-CM | POA: Diagnosis not present

## 2014-11-20 DIAGNOSIS — D72821 Monocytosis (symptomatic): Secondary | ICD-10-CM | POA: Diagnosis not present

## 2014-11-20 DIAGNOSIS — G309 Alzheimer's disease, unspecified: Secondary | ICD-10-CM | POA: Diagnosis not present

## 2014-11-20 DIAGNOSIS — R69 Illness, unspecified: Secondary | ICD-10-CM | POA: Diagnosis not present

## 2014-11-24 DIAGNOSIS — N183 Chronic kidney disease, stage 3 (moderate): Secondary | ICD-10-CM | POA: Diagnosis not present

## 2014-11-24 DIAGNOSIS — M4807 Spinal stenosis, lumbosacral region: Secondary | ICD-10-CM | POA: Diagnosis not present

## 2014-11-24 DIAGNOSIS — R260 Ataxic gait: Secondary | ICD-10-CM | POA: Diagnosis not present

## 2014-11-24 DIAGNOSIS — G309 Alzheimer's disease, unspecified: Secondary | ICD-10-CM | POA: Diagnosis not present

## 2014-11-24 DIAGNOSIS — I1310 Hypertensive heart and chronic kidney disease without heart failure, with stage 1 through stage 4 chronic kidney disease, or unspecified chronic kidney disease: Secondary | ICD-10-CM | POA: Diagnosis not present

## 2014-11-26 DIAGNOSIS — G309 Alzheimer's disease, unspecified: Secondary | ICD-10-CM | POA: Diagnosis not present

## 2014-11-26 DIAGNOSIS — M1990 Unspecified osteoarthritis, unspecified site: Secondary | ICD-10-CM | POA: Diagnosis not present

## 2014-11-26 DIAGNOSIS — M5137 Other intervertebral disc degeneration, lumbosacral region: Secondary | ICD-10-CM | POA: Diagnosis not present

## 2014-11-26 DIAGNOSIS — R270 Ataxia, unspecified: Secondary | ICD-10-CM | POA: Diagnosis not present

## 2014-11-26 DIAGNOSIS — M4807 Spinal stenosis, lumbosacral region: Secondary | ICD-10-CM | POA: Diagnosis not present

## 2014-11-26 DIAGNOSIS — I10 Essential (primary) hypertension: Secondary | ICD-10-CM | POA: Diagnosis not present

## 2014-11-27 DIAGNOSIS — R260 Ataxic gait: Secondary | ICD-10-CM | POA: Diagnosis not present

## 2014-11-27 DIAGNOSIS — M5137 Other intervertebral disc degeneration, lumbosacral region: Secondary | ICD-10-CM | POA: Diagnosis not present

## 2014-11-27 DIAGNOSIS — M6281 Muscle weakness (generalized): Secondary | ICD-10-CM | POA: Diagnosis not present

## 2014-11-27 DIAGNOSIS — Z79899 Other long term (current) drug therapy: Secondary | ICD-10-CM | POA: Diagnosis not present

## 2014-11-27 DIAGNOSIS — G309 Alzheimer's disease, unspecified: Secondary | ICD-10-CM | POA: Diagnosis not present

## 2014-11-27 DIAGNOSIS — M4807 Spinal stenosis, lumbosacral region: Secondary | ICD-10-CM | POA: Diagnosis not present

## 2014-11-27 DIAGNOSIS — M2550 Pain in unspecified joint: Secondary | ICD-10-CM | POA: Diagnosis not present

## 2014-11-27 DIAGNOSIS — R69 Illness, unspecified: Secondary | ICD-10-CM | POA: Diagnosis not present

## 2014-11-27 DIAGNOSIS — I10 Essential (primary) hypertension: Secondary | ICD-10-CM | POA: Diagnosis not present

## 2014-11-27 DIAGNOSIS — R270 Ataxia, unspecified: Secondary | ICD-10-CM | POA: Diagnosis not present

## 2014-11-27 DIAGNOSIS — M1990 Unspecified osteoarthritis, unspecified site: Secondary | ICD-10-CM | POA: Diagnosis not present

## 2014-11-30 DIAGNOSIS — M1990 Unspecified osteoarthritis, unspecified site: Secondary | ICD-10-CM | POA: Diagnosis not present

## 2014-11-30 DIAGNOSIS — I10 Essential (primary) hypertension: Secondary | ICD-10-CM | POA: Diagnosis not present

## 2014-11-30 DIAGNOSIS — R270 Ataxia, unspecified: Secondary | ICD-10-CM | POA: Diagnosis not present

## 2014-11-30 DIAGNOSIS — M4807 Spinal stenosis, lumbosacral region: Secondary | ICD-10-CM | POA: Diagnosis not present

## 2014-11-30 DIAGNOSIS — G309 Alzheimer's disease, unspecified: Secondary | ICD-10-CM | POA: Diagnosis not present

## 2014-11-30 DIAGNOSIS — M5137 Other intervertebral disc degeneration, lumbosacral region: Secondary | ICD-10-CM | POA: Diagnosis not present

## 2014-12-05 DIAGNOSIS — Z780 Asymptomatic menopausal state: Secondary | ICD-10-CM | POA: Diagnosis not present

## 2014-12-05 DIAGNOSIS — I10 Essential (primary) hypertension: Secondary | ICD-10-CM | POA: Diagnosis not present

## 2014-12-05 DIAGNOSIS — M5137 Other intervertebral disc degeneration, lumbosacral region: Secondary | ICD-10-CM | POA: Diagnosis not present

## 2014-12-05 DIAGNOSIS — R270 Ataxia, unspecified: Secondary | ICD-10-CM | POA: Diagnosis not present

## 2014-12-05 DIAGNOSIS — Z13820 Encounter for screening for osteoporosis: Secondary | ICD-10-CM | POA: Diagnosis not present

## 2014-12-05 DIAGNOSIS — G309 Alzheimer's disease, unspecified: Secondary | ICD-10-CM | POA: Diagnosis not present

## 2014-12-05 DIAGNOSIS — M4807 Spinal stenosis, lumbosacral region: Secondary | ICD-10-CM | POA: Diagnosis not present

## 2014-12-05 DIAGNOSIS — M1990 Unspecified osteoarthritis, unspecified site: Secondary | ICD-10-CM | POA: Diagnosis not present

## 2014-12-20 DIAGNOSIS — I1310 Hypertensive heart and chronic kidney disease without heart failure, with stage 1 through stage 4 chronic kidney disease, or unspecified chronic kidney disease: Secondary | ICD-10-CM | POA: Diagnosis not present

## 2014-12-20 DIAGNOSIS — N183 Chronic kidney disease, stage 3 (moderate): Secondary | ICD-10-CM | POA: Diagnosis not present

## 2014-12-20 DIAGNOSIS — R260 Ataxic gait: Secondary | ICD-10-CM | POA: Diagnosis not present

## 2014-12-20 DIAGNOSIS — Z79899 Other long term (current) drug therapy: Secondary | ICD-10-CM | POA: Diagnosis not present

## 2014-12-20 DIAGNOSIS — G309 Alzheimer's disease, unspecified: Secondary | ICD-10-CM | POA: Diagnosis not present

## 2015-01-29 DIAGNOSIS — M109 Gout, unspecified: Secondary | ICD-10-CM | POA: Diagnosis not present

## 2015-01-29 DIAGNOSIS — G4700 Insomnia, unspecified: Secondary | ICD-10-CM | POA: Diagnosis not present

## 2015-01-29 DIAGNOSIS — J45909 Unspecified asthma, uncomplicated: Secondary | ICD-10-CM | POA: Diagnosis not present

## 2015-01-29 DIAGNOSIS — R29898 Other symptoms and signs involving the musculoskeletal system: Secondary | ICD-10-CM | POA: Diagnosis not present

## 2015-01-29 DIAGNOSIS — I1310 Hypertensive heart and chronic kidney disease without heart failure, with stage 1 through stage 4 chronic kidney disease, or unspecified chronic kidney disease: Secondary | ICD-10-CM | POA: Diagnosis not present

## 2015-01-29 DIAGNOSIS — E785 Hyperlipidemia, unspecified: Secondary | ICD-10-CM | POA: Diagnosis not present

## 2015-01-29 DIAGNOSIS — N183 Chronic kidney disease, stage 3 (moderate): Secondary | ICD-10-CM | POA: Diagnosis not present

## 2015-01-29 DIAGNOSIS — R69 Illness, unspecified: Secondary | ICD-10-CM | POA: Diagnosis not present

## 2015-01-29 DIAGNOSIS — G309 Alzheimer's disease, unspecified: Secondary | ICD-10-CM | POA: Diagnosis not present

## 2015-01-31 DIAGNOSIS — H25813 Combined forms of age-related cataract, bilateral: Secondary | ICD-10-CM | POA: Diagnosis not present

## 2015-02-04 DIAGNOSIS — R29898 Other symptoms and signs involving the musculoskeletal system: Secondary | ICD-10-CM | POA: Diagnosis not present

## 2015-02-04 DIAGNOSIS — N183 Chronic kidney disease, stage 3 (moderate): Secondary | ICD-10-CM | POA: Diagnosis not present

## 2015-02-04 DIAGNOSIS — R531 Weakness: Secondary | ICD-10-CM | POA: Diagnosis not present

## 2015-02-04 DIAGNOSIS — I1310 Hypertensive heart and chronic kidney disease without heart failure, with stage 1 through stage 4 chronic kidney disease, or unspecified chronic kidney disease: Secondary | ICD-10-CM | POA: Diagnosis not present

## 2015-02-04 DIAGNOSIS — G250 Essential tremor: Secondary | ICD-10-CM | POA: Diagnosis not present

## 2015-02-04 DIAGNOSIS — R69 Illness, unspecified: Secondary | ICD-10-CM | POA: Diagnosis not present

## 2015-02-04 DIAGNOSIS — G309 Alzheimer's disease, unspecified: Secondary | ICD-10-CM | POA: Diagnosis not present

## 2015-02-04 DIAGNOSIS — G252 Other specified forms of tremor: Secondary | ICD-10-CM | POA: Diagnosis not present

## 2015-02-06 DIAGNOSIS — G250 Essential tremor: Secondary | ICD-10-CM | POA: Diagnosis not present

## 2015-02-06 DIAGNOSIS — G309 Alzheimer's disease, unspecified: Secondary | ICD-10-CM | POA: Diagnosis not present

## 2015-02-06 DIAGNOSIS — I1310 Hypertensive heart and chronic kidney disease without heart failure, with stage 1 through stage 4 chronic kidney disease, or unspecified chronic kidney disease: Secondary | ICD-10-CM | POA: Diagnosis not present

## 2015-02-06 DIAGNOSIS — N183 Chronic kidney disease, stage 3 (moderate): Secondary | ICD-10-CM | POA: Diagnosis not present

## 2015-02-06 DIAGNOSIS — R69 Illness, unspecified: Secondary | ICD-10-CM | POA: Diagnosis not present

## 2015-02-06 DIAGNOSIS — R531 Weakness: Secondary | ICD-10-CM | POA: Diagnosis not present

## 2015-02-07 DIAGNOSIS — G250 Essential tremor: Secondary | ICD-10-CM | POA: Diagnosis not present

## 2015-02-07 DIAGNOSIS — R69 Illness, unspecified: Secondary | ICD-10-CM | POA: Diagnosis not present

## 2015-02-07 DIAGNOSIS — G309 Alzheimer's disease, unspecified: Secondary | ICD-10-CM | POA: Diagnosis not present

## 2015-02-07 DIAGNOSIS — I1310 Hypertensive heart and chronic kidney disease without heart failure, with stage 1 through stage 4 chronic kidney disease, or unspecified chronic kidney disease: Secondary | ICD-10-CM | POA: Diagnosis not present

## 2015-02-07 DIAGNOSIS — R531 Weakness: Secondary | ICD-10-CM | POA: Diagnosis not present

## 2015-02-07 DIAGNOSIS — N183 Chronic kidney disease, stage 3 (moderate): Secondary | ICD-10-CM | POA: Diagnosis not present

## 2015-02-11 DIAGNOSIS — R69 Illness, unspecified: Secondary | ICD-10-CM | POA: Diagnosis not present

## 2015-02-11 DIAGNOSIS — G250 Essential tremor: Secondary | ICD-10-CM | POA: Diagnosis not present

## 2015-02-11 DIAGNOSIS — R531 Weakness: Secondary | ICD-10-CM | POA: Diagnosis not present

## 2015-02-11 DIAGNOSIS — I1310 Hypertensive heart and chronic kidney disease without heart failure, with stage 1 through stage 4 chronic kidney disease, or unspecified chronic kidney disease: Secondary | ICD-10-CM | POA: Diagnosis not present

## 2015-02-11 DIAGNOSIS — G309 Alzheimer's disease, unspecified: Secondary | ICD-10-CM | POA: Diagnosis not present

## 2015-02-11 DIAGNOSIS — N183 Chronic kidney disease, stage 3 (moderate): Secondary | ICD-10-CM | POA: Diagnosis not present

## 2015-02-12 DIAGNOSIS — N183 Chronic kidney disease, stage 3 (moderate): Secondary | ICD-10-CM | POA: Diagnosis not present

## 2015-02-12 DIAGNOSIS — G309 Alzheimer's disease, unspecified: Secondary | ICD-10-CM | POA: Diagnosis not present

## 2015-02-12 DIAGNOSIS — R69 Illness, unspecified: Secondary | ICD-10-CM | POA: Diagnosis not present

## 2015-02-12 DIAGNOSIS — I1310 Hypertensive heart and chronic kidney disease without heart failure, with stage 1 through stage 4 chronic kidney disease, or unspecified chronic kidney disease: Secondary | ICD-10-CM | POA: Diagnosis not present

## 2015-02-12 DIAGNOSIS — R531 Weakness: Secondary | ICD-10-CM | POA: Diagnosis not present

## 2015-02-12 DIAGNOSIS — G250 Essential tremor: Secondary | ICD-10-CM | POA: Diagnosis not present

## 2015-02-14 DIAGNOSIS — R69 Illness, unspecified: Secondary | ICD-10-CM | POA: Diagnosis not present

## 2015-02-14 DIAGNOSIS — R531 Weakness: Secondary | ICD-10-CM | POA: Diagnosis not present

## 2015-02-14 DIAGNOSIS — G309 Alzheimer's disease, unspecified: Secondary | ICD-10-CM | POA: Diagnosis not present

## 2015-02-14 DIAGNOSIS — I1310 Hypertensive heart and chronic kidney disease without heart failure, with stage 1 through stage 4 chronic kidney disease, or unspecified chronic kidney disease: Secondary | ICD-10-CM | POA: Diagnosis not present

## 2015-02-14 DIAGNOSIS — G250 Essential tremor: Secondary | ICD-10-CM | POA: Diagnosis not present

## 2015-02-14 DIAGNOSIS — N183 Chronic kidney disease, stage 3 (moderate): Secondary | ICD-10-CM | POA: Diagnosis not present

## 2015-02-17 DIAGNOSIS — N183 Chronic kidney disease, stage 3 (moderate): Secondary | ICD-10-CM | POA: Diagnosis not present

## 2015-02-17 DIAGNOSIS — G250 Essential tremor: Secondary | ICD-10-CM | POA: Diagnosis not present

## 2015-02-17 DIAGNOSIS — R531 Weakness: Secondary | ICD-10-CM | POA: Diagnosis not present

## 2015-02-17 DIAGNOSIS — I1310 Hypertensive heart and chronic kidney disease without heart failure, with stage 1 through stage 4 chronic kidney disease, or unspecified chronic kidney disease: Secondary | ICD-10-CM | POA: Diagnosis not present

## 2015-02-17 DIAGNOSIS — R69 Illness, unspecified: Secondary | ICD-10-CM | POA: Diagnosis not present

## 2015-02-17 DIAGNOSIS — G309 Alzheimer's disease, unspecified: Secondary | ICD-10-CM | POA: Diagnosis not present

## 2015-02-19 DIAGNOSIS — N183 Chronic kidney disease, stage 3 (moderate): Secondary | ICD-10-CM | POA: Diagnosis not present

## 2015-02-19 DIAGNOSIS — R531 Weakness: Secondary | ICD-10-CM | POA: Diagnosis not present

## 2015-02-19 DIAGNOSIS — G250 Essential tremor: Secondary | ICD-10-CM | POA: Diagnosis not present

## 2015-02-19 DIAGNOSIS — G309 Alzheimer's disease, unspecified: Secondary | ICD-10-CM | POA: Diagnosis not present

## 2015-02-19 DIAGNOSIS — R69 Illness, unspecified: Secondary | ICD-10-CM | POA: Diagnosis not present

## 2015-02-19 DIAGNOSIS — I1310 Hypertensive heart and chronic kidney disease without heart failure, with stage 1 through stage 4 chronic kidney disease, or unspecified chronic kidney disease: Secondary | ICD-10-CM | POA: Diagnosis not present

## 2015-02-21 DIAGNOSIS — G309 Alzheimer's disease, unspecified: Secondary | ICD-10-CM | POA: Diagnosis not present

## 2015-02-21 DIAGNOSIS — R531 Weakness: Secondary | ICD-10-CM | POA: Diagnosis not present

## 2015-02-21 DIAGNOSIS — R69 Illness, unspecified: Secondary | ICD-10-CM | POA: Diagnosis not present

## 2015-02-21 DIAGNOSIS — G250 Essential tremor: Secondary | ICD-10-CM | POA: Diagnosis not present

## 2015-02-21 DIAGNOSIS — N183 Chronic kidney disease, stage 3 (moderate): Secondary | ICD-10-CM | POA: Diagnosis not present

## 2015-02-21 DIAGNOSIS — I1310 Hypertensive heart and chronic kidney disease without heart failure, with stage 1 through stage 4 chronic kidney disease, or unspecified chronic kidney disease: Secondary | ICD-10-CM | POA: Diagnosis not present

## 2015-02-26 DIAGNOSIS — G309 Alzheimer's disease, unspecified: Secondary | ICD-10-CM | POA: Diagnosis not present

## 2015-02-26 DIAGNOSIS — G250 Essential tremor: Secondary | ICD-10-CM | POA: Diagnosis not present

## 2015-02-26 DIAGNOSIS — N183 Chronic kidney disease, stage 3 (moderate): Secondary | ICD-10-CM | POA: Diagnosis not present

## 2015-02-26 DIAGNOSIS — I1310 Hypertensive heart and chronic kidney disease without heart failure, with stage 1 through stage 4 chronic kidney disease, or unspecified chronic kidney disease: Secondary | ICD-10-CM | POA: Diagnosis not present

## 2015-02-26 DIAGNOSIS — R69 Illness, unspecified: Secondary | ICD-10-CM | POA: Diagnosis not present

## 2015-02-26 DIAGNOSIS — R531 Weakness: Secondary | ICD-10-CM | POA: Diagnosis not present

## 2015-02-27 DIAGNOSIS — R531 Weakness: Secondary | ICD-10-CM | POA: Diagnosis not present

## 2015-02-27 DIAGNOSIS — I1310 Hypertensive heart and chronic kidney disease without heart failure, with stage 1 through stage 4 chronic kidney disease, or unspecified chronic kidney disease: Secondary | ICD-10-CM | POA: Diagnosis not present

## 2015-02-27 DIAGNOSIS — N183 Chronic kidney disease, stage 3 (moderate): Secondary | ICD-10-CM | POA: Diagnosis not present

## 2015-02-27 DIAGNOSIS — G309 Alzheimer's disease, unspecified: Secondary | ICD-10-CM | POA: Diagnosis not present

## 2015-02-27 DIAGNOSIS — R69 Illness, unspecified: Secondary | ICD-10-CM | POA: Diagnosis not present

## 2015-02-27 DIAGNOSIS — G250 Essential tremor: Secondary | ICD-10-CM | POA: Diagnosis not present

## 2015-03-01 DIAGNOSIS — G250 Essential tremor: Secondary | ICD-10-CM | POA: Diagnosis not present

## 2015-03-01 DIAGNOSIS — N183 Chronic kidney disease, stage 3 (moderate): Secondary | ICD-10-CM | POA: Diagnosis not present

## 2015-03-01 DIAGNOSIS — R531 Weakness: Secondary | ICD-10-CM | POA: Diagnosis not present

## 2015-03-01 DIAGNOSIS — R69 Illness, unspecified: Secondary | ICD-10-CM | POA: Diagnosis not present

## 2015-03-01 DIAGNOSIS — G309 Alzheimer's disease, unspecified: Secondary | ICD-10-CM | POA: Diagnosis not present

## 2015-03-01 DIAGNOSIS — I1310 Hypertensive heart and chronic kidney disease without heart failure, with stage 1 through stage 4 chronic kidney disease, or unspecified chronic kidney disease: Secondary | ICD-10-CM | POA: Diagnosis not present

## 2015-03-04 DIAGNOSIS — R05 Cough: Secondary | ICD-10-CM | POA: Diagnosis not present

## 2015-03-04 DIAGNOSIS — G309 Alzheimer's disease, unspecified: Secondary | ICD-10-CM | POA: Diagnosis not present

## 2015-03-04 DIAGNOSIS — I1310 Hypertensive heart and chronic kidney disease without heart failure, with stage 1 through stage 4 chronic kidney disease, or unspecified chronic kidney disease: Secondary | ICD-10-CM | POA: Diagnosis not present

## 2015-03-04 DIAGNOSIS — J22 Unspecified acute lower respiratory infection: Secondary | ICD-10-CM | POA: Diagnosis not present

## 2015-03-04 DIAGNOSIS — E785 Hyperlipidemia, unspecified: Secondary | ICD-10-CM | POA: Diagnosis not present

## 2015-03-04 DIAGNOSIS — R531 Weakness: Secondary | ICD-10-CM | POA: Diagnosis not present

## 2015-03-04 DIAGNOSIS — N183 Chronic kidney disease, stage 3 (moderate): Secondary | ICD-10-CM | POA: Diagnosis not present

## 2015-03-04 DIAGNOSIS — R69 Illness, unspecified: Secondary | ICD-10-CM | POA: Diagnosis not present

## 2015-03-04 DIAGNOSIS — G250 Essential tremor: Secondary | ICD-10-CM | POA: Diagnosis not present

## 2015-03-04 DIAGNOSIS — I10 Essential (primary) hypertension: Secondary | ICD-10-CM | POA: Diagnosis not present

## 2015-03-04 DIAGNOSIS — R509 Fever, unspecified: Secondary | ICD-10-CM | POA: Diagnosis not present

## 2015-03-05 DIAGNOSIS — R69 Illness, unspecified: Secondary | ICD-10-CM | POA: Diagnosis not present

## 2015-03-05 DIAGNOSIS — G309 Alzheimer's disease, unspecified: Secondary | ICD-10-CM | POA: Diagnosis not present

## 2015-03-05 DIAGNOSIS — R531 Weakness: Secondary | ICD-10-CM | POA: Diagnosis not present

## 2015-03-05 DIAGNOSIS — I1310 Hypertensive heart and chronic kidney disease without heart failure, with stage 1 through stage 4 chronic kidney disease, or unspecified chronic kidney disease: Secondary | ICD-10-CM | POA: Diagnosis not present

## 2015-03-05 DIAGNOSIS — G250 Essential tremor: Secondary | ICD-10-CM | POA: Diagnosis not present

## 2015-03-05 DIAGNOSIS — N183 Chronic kidney disease, stage 3 (moderate): Secondary | ICD-10-CM | POA: Diagnosis not present

## 2015-03-07 DIAGNOSIS — G309 Alzheimer's disease, unspecified: Secondary | ICD-10-CM | POA: Diagnosis not present

## 2015-03-07 DIAGNOSIS — R69 Illness, unspecified: Secondary | ICD-10-CM | POA: Diagnosis not present

## 2015-03-07 DIAGNOSIS — R05 Cough: Secondary | ICD-10-CM | POA: Diagnosis not present

## 2015-03-07 DIAGNOSIS — R531 Weakness: Secondary | ICD-10-CM | POA: Diagnosis not present

## 2015-03-07 DIAGNOSIS — G250 Essential tremor: Secondary | ICD-10-CM | POA: Diagnosis not present

## 2015-03-07 DIAGNOSIS — I1310 Hypertensive heart and chronic kidney disease without heart failure, with stage 1 through stage 4 chronic kidney disease, or unspecified chronic kidney disease: Secondary | ICD-10-CM | POA: Diagnosis not present

## 2015-03-07 DIAGNOSIS — N183 Chronic kidney disease, stage 3 (moderate): Secondary | ICD-10-CM | POA: Diagnosis not present

## 2015-03-12 DIAGNOSIS — I1310 Hypertensive heart and chronic kidney disease without heart failure, with stage 1 through stage 4 chronic kidney disease, or unspecified chronic kidney disease: Secondary | ICD-10-CM | POA: Diagnosis not present

## 2015-03-12 DIAGNOSIS — R69 Illness, unspecified: Secondary | ICD-10-CM | POA: Diagnosis not present

## 2015-03-12 DIAGNOSIS — G250 Essential tremor: Secondary | ICD-10-CM | POA: Diagnosis not present

## 2015-03-12 DIAGNOSIS — G309 Alzheimer's disease, unspecified: Secondary | ICD-10-CM | POA: Diagnosis not present

## 2015-03-12 DIAGNOSIS — N183 Chronic kidney disease, stage 3 (moderate): Secondary | ICD-10-CM | POA: Diagnosis not present

## 2015-03-12 DIAGNOSIS — R531 Weakness: Secondary | ICD-10-CM | POA: Diagnosis not present

## 2015-03-14 DIAGNOSIS — G309 Alzheimer's disease, unspecified: Secondary | ICD-10-CM | POA: Diagnosis not present

## 2015-03-14 DIAGNOSIS — R69 Illness, unspecified: Secondary | ICD-10-CM | POA: Diagnosis not present

## 2015-03-14 DIAGNOSIS — R531 Weakness: Secondary | ICD-10-CM | POA: Diagnosis not present

## 2015-03-14 DIAGNOSIS — G250 Essential tremor: Secondary | ICD-10-CM | POA: Diagnosis not present

## 2015-03-14 DIAGNOSIS — I1310 Hypertensive heart and chronic kidney disease without heart failure, with stage 1 through stage 4 chronic kidney disease, or unspecified chronic kidney disease: Secondary | ICD-10-CM | POA: Diagnosis not present

## 2015-03-14 DIAGNOSIS — N183 Chronic kidney disease, stage 3 (moderate): Secondary | ICD-10-CM | POA: Diagnosis not present

## 2015-03-19 DIAGNOSIS — R69 Illness, unspecified: Secondary | ICD-10-CM | POA: Diagnosis not present

## 2015-03-19 DIAGNOSIS — G309 Alzheimer's disease, unspecified: Secondary | ICD-10-CM | POA: Diagnosis not present

## 2015-03-19 DIAGNOSIS — N183 Chronic kidney disease, stage 3 (moderate): Secondary | ICD-10-CM | POA: Diagnosis not present

## 2015-03-19 DIAGNOSIS — I1310 Hypertensive heart and chronic kidney disease without heart failure, with stage 1 through stage 4 chronic kidney disease, or unspecified chronic kidney disease: Secondary | ICD-10-CM | POA: Diagnosis not present

## 2015-03-19 DIAGNOSIS — G250 Essential tremor: Secondary | ICD-10-CM | POA: Diagnosis not present

## 2015-03-19 DIAGNOSIS — R531 Weakness: Secondary | ICD-10-CM | POA: Diagnosis not present

## 2015-03-20 DIAGNOSIS — G250 Essential tremor: Secondary | ICD-10-CM | POA: Diagnosis not present

## 2015-03-21 DIAGNOSIS — R69 Illness, unspecified: Secondary | ICD-10-CM | POA: Diagnosis not present

## 2015-03-21 DIAGNOSIS — I1310 Hypertensive heart and chronic kidney disease without heart failure, with stage 1 through stage 4 chronic kidney disease, or unspecified chronic kidney disease: Secondary | ICD-10-CM | POA: Diagnosis not present

## 2015-03-21 DIAGNOSIS — R531 Weakness: Secondary | ICD-10-CM | POA: Diagnosis not present

## 2015-03-21 DIAGNOSIS — G250 Essential tremor: Secondary | ICD-10-CM | POA: Diagnosis not present

## 2015-03-21 DIAGNOSIS — G309 Alzheimer's disease, unspecified: Secondary | ICD-10-CM | POA: Diagnosis not present

## 2015-03-21 DIAGNOSIS — N183 Chronic kidney disease, stage 3 (moderate): Secondary | ICD-10-CM | POA: Diagnosis not present

## 2015-03-26 DIAGNOSIS — N183 Chronic kidney disease, stage 3 (moderate): Secondary | ICD-10-CM | POA: Diagnosis not present

## 2015-03-26 DIAGNOSIS — G309 Alzheimer's disease, unspecified: Secondary | ICD-10-CM | POA: Diagnosis not present

## 2015-03-26 DIAGNOSIS — R531 Weakness: Secondary | ICD-10-CM | POA: Diagnosis not present

## 2015-03-26 DIAGNOSIS — R69 Illness, unspecified: Secondary | ICD-10-CM | POA: Diagnosis not present

## 2015-03-26 DIAGNOSIS — I1310 Hypertensive heart and chronic kidney disease without heart failure, with stage 1 through stage 4 chronic kidney disease, or unspecified chronic kidney disease: Secondary | ICD-10-CM | POA: Diagnosis not present

## 2015-03-26 DIAGNOSIS — G250 Essential tremor: Secondary | ICD-10-CM | POA: Diagnosis not present

## 2015-03-27 DIAGNOSIS — N179 Acute kidney failure, unspecified: Secondary | ICD-10-CM | POA: Diagnosis not present

## 2015-03-28 DIAGNOSIS — E559 Vitamin D deficiency, unspecified: Secondary | ICD-10-CM

## 2015-03-28 DIAGNOSIS — M10379 Gout due to renal impairment, unspecified ankle and foot: Secondary | ICD-10-CM

## 2015-03-28 DIAGNOSIS — N179 Acute kidney failure, unspecified: Secondary | ICD-10-CM

## 2015-03-28 DIAGNOSIS — I10 Essential (primary) hypertension: Secondary | ICD-10-CM | POA: Diagnosis not present

## 2015-03-28 HISTORY — DX: Acute kidney failure, unspecified: N17.9

## 2015-03-28 HISTORY — DX: Vitamin D deficiency, unspecified: E55.9

## 2015-03-28 HISTORY — DX: Gout due to renal impairment, unspecified ankle and foot: M10.379

## 2015-04-03 DIAGNOSIS — G252 Other specified forms of tremor: Secondary | ICD-10-CM | POA: Diagnosis not present

## 2015-04-03 DIAGNOSIS — N183 Chronic kidney disease, stage 3 (moderate): Secondary | ICD-10-CM | POA: Diagnosis not present

## 2015-04-03 DIAGNOSIS — G8929 Other chronic pain: Secondary | ICD-10-CM | POA: Insufficient documentation

## 2015-04-03 DIAGNOSIS — R531 Weakness: Secondary | ICD-10-CM | POA: Diagnosis not present

## 2015-04-03 DIAGNOSIS — R69 Illness, unspecified: Secondary | ICD-10-CM | POA: Diagnosis not present

## 2015-04-03 DIAGNOSIS — G250 Essential tremor: Secondary | ICD-10-CM | POA: Diagnosis not present

## 2015-04-03 DIAGNOSIS — M545 Low back pain, unspecified: Secondary | ICD-10-CM

## 2015-04-03 DIAGNOSIS — G301 Alzheimer's disease with late onset: Secondary | ICD-10-CM

## 2015-04-03 DIAGNOSIS — F0280 Dementia in other diseases classified elsewhere without behavioral disturbance: Secondary | ICD-10-CM

## 2015-04-03 DIAGNOSIS — I1310 Hypertensive heart and chronic kidney disease without heart failure, with stage 1 through stage 4 chronic kidney disease, or unspecified chronic kidney disease: Secondary | ICD-10-CM | POA: Diagnosis not present

## 2015-04-03 DIAGNOSIS — G309 Alzheimer's disease, unspecified: Secondary | ICD-10-CM | POA: Diagnosis not present

## 2015-04-03 HISTORY — DX: Other chronic pain: G89.29

## 2015-04-03 HISTORY — DX: Dementia in other diseases classified elsewhere, unspecified severity, without behavioral disturbance, psychotic disturbance, mood disturbance, and anxiety: F02.80

## 2015-04-03 HISTORY — DX: Low back pain, unspecified: M54.50

## 2015-04-09 DIAGNOSIS — G250 Essential tremor: Secondary | ICD-10-CM | POA: Diagnosis not present

## 2015-04-09 DIAGNOSIS — R69 Illness, unspecified: Secondary | ICD-10-CM | POA: Diagnosis not present

## 2015-04-09 DIAGNOSIS — I1310 Hypertensive heart and chronic kidney disease without heart failure, with stage 1 through stage 4 chronic kidney disease, or unspecified chronic kidney disease: Secondary | ICD-10-CM | POA: Diagnosis not present

## 2015-04-09 DIAGNOSIS — G309 Alzheimer's disease, unspecified: Secondary | ICD-10-CM | POA: Diagnosis not present

## 2015-04-09 DIAGNOSIS — R531 Weakness: Secondary | ICD-10-CM | POA: Diagnosis not present

## 2015-04-09 DIAGNOSIS — N183 Chronic kidney disease, stage 3 (moderate): Secondary | ICD-10-CM | POA: Diagnosis not present

## 2015-04-10 DIAGNOSIS — M6281 Muscle weakness (generalized): Secondary | ICD-10-CM | POA: Diagnosis not present

## 2015-04-10 DIAGNOSIS — M545 Low back pain: Secondary | ICD-10-CM | POA: Diagnosis not present

## 2015-04-10 DIAGNOSIS — R2689 Other abnormalities of gait and mobility: Secondary | ICD-10-CM | POA: Diagnosis not present

## 2015-04-11 DIAGNOSIS — M545 Low back pain: Secondary | ICD-10-CM | POA: Diagnosis not present

## 2015-04-11 DIAGNOSIS — M6281 Muscle weakness (generalized): Secondary | ICD-10-CM | POA: Diagnosis not present

## 2015-04-11 DIAGNOSIS — R2689 Other abnormalities of gait and mobility: Secondary | ICD-10-CM | POA: Diagnosis not present

## 2015-04-16 DIAGNOSIS — M6281 Muscle weakness (generalized): Secondary | ICD-10-CM | POA: Diagnosis not present

## 2015-04-16 DIAGNOSIS — M545 Low back pain: Secondary | ICD-10-CM | POA: Diagnosis not present

## 2015-04-16 DIAGNOSIS — R2689 Other abnormalities of gait and mobility: Secondary | ICD-10-CM | POA: Diagnosis not present

## 2015-04-18 DIAGNOSIS — M545 Low back pain: Secondary | ICD-10-CM | POA: Diagnosis not present

## 2015-04-18 DIAGNOSIS — R2689 Other abnormalities of gait and mobility: Secondary | ICD-10-CM | POA: Diagnosis not present

## 2015-04-18 DIAGNOSIS — M6281 Muscle weakness (generalized): Secondary | ICD-10-CM | POA: Diagnosis not present

## 2015-04-22 DIAGNOSIS — M6281 Muscle weakness (generalized): Secondary | ICD-10-CM | POA: Diagnosis not present

## 2015-04-22 DIAGNOSIS — M545 Low back pain: Secondary | ICD-10-CM | POA: Diagnosis not present

## 2015-04-22 DIAGNOSIS — R2689 Other abnormalities of gait and mobility: Secondary | ICD-10-CM | POA: Diagnosis not present

## 2015-04-23 DIAGNOSIS — G8929 Other chronic pain: Secondary | ICD-10-CM | POA: Diagnosis not present

## 2015-04-23 DIAGNOSIS — M9080 Osteopathy in diseases classified elsewhere, unspecified site: Secondary | ICD-10-CM | POA: Diagnosis not present

## 2015-04-23 DIAGNOSIS — M898X9 Other specified disorders of bone, unspecified site: Secondary | ICD-10-CM | POA: Insufficient documentation

## 2015-04-23 DIAGNOSIS — I129 Hypertensive chronic kidney disease with stage 1 through stage 4 chronic kidney disease, or unspecified chronic kidney disease: Secondary | ICD-10-CM | POA: Diagnosis not present

## 2015-04-23 DIAGNOSIS — E889 Metabolic disorder, unspecified: Secondary | ICD-10-CM

## 2015-04-23 DIAGNOSIS — E559 Vitamin D deficiency, unspecified: Secondary | ICD-10-CM | POA: Diagnosis not present

## 2015-04-23 DIAGNOSIS — M545 Low back pain: Secondary | ICD-10-CM | POA: Diagnosis not present

## 2015-04-23 DIAGNOSIS — N183 Chronic kidney disease, stage 3 (moderate): Secondary | ICD-10-CM | POA: Diagnosis not present

## 2015-04-23 HISTORY — DX: Metabolic disorder, unspecified: E88.9

## 2015-04-23 HISTORY — DX: Other specified disorders of bone, unspecified site: M89.8X9

## 2015-04-25 DIAGNOSIS — M545 Low back pain: Secondary | ICD-10-CM | POA: Diagnosis not present

## 2015-04-25 DIAGNOSIS — R2689 Other abnormalities of gait and mobility: Secondary | ICD-10-CM | POA: Diagnosis not present

## 2015-04-25 DIAGNOSIS — M6281 Muscle weakness (generalized): Secondary | ICD-10-CM | POA: Diagnosis not present

## 2015-04-30 DIAGNOSIS — M6281 Muscle weakness (generalized): Secondary | ICD-10-CM | POA: Diagnosis not present

## 2015-04-30 DIAGNOSIS — M545 Low back pain: Secondary | ICD-10-CM | POA: Diagnosis not present

## 2015-04-30 DIAGNOSIS — R2689 Other abnormalities of gait and mobility: Secondary | ICD-10-CM | POA: Diagnosis not present

## 2015-05-02 DIAGNOSIS — M545 Low back pain: Secondary | ICD-10-CM | POA: Diagnosis not present

## 2015-05-02 DIAGNOSIS — M6281 Muscle weakness (generalized): Secondary | ICD-10-CM | POA: Diagnosis not present

## 2015-05-02 DIAGNOSIS — R2689 Other abnormalities of gait and mobility: Secondary | ICD-10-CM | POA: Diagnosis not present

## 2015-05-07 DIAGNOSIS — R2689 Other abnormalities of gait and mobility: Secondary | ICD-10-CM | POA: Diagnosis not present

## 2015-05-07 DIAGNOSIS — M6281 Muscle weakness (generalized): Secondary | ICD-10-CM | POA: Diagnosis not present

## 2015-05-07 DIAGNOSIS — M545 Low back pain: Secondary | ICD-10-CM | POA: Diagnosis not present

## 2015-05-09 DIAGNOSIS — R2689 Other abnormalities of gait and mobility: Secondary | ICD-10-CM | POA: Diagnosis not present

## 2015-05-09 DIAGNOSIS — M6281 Muscle weakness (generalized): Secondary | ICD-10-CM | POA: Diagnosis not present

## 2015-05-09 DIAGNOSIS — M545 Low back pain: Secondary | ICD-10-CM | POA: Diagnosis not present

## 2015-05-14 DIAGNOSIS — M6281 Muscle weakness (generalized): Secondary | ICD-10-CM | POA: Diagnosis not present

## 2015-05-14 DIAGNOSIS — R2689 Other abnormalities of gait and mobility: Secondary | ICD-10-CM | POA: Diagnosis not present

## 2015-05-14 DIAGNOSIS — M545 Low back pain: Secondary | ICD-10-CM | POA: Diagnosis not present

## 2015-05-16 DIAGNOSIS — M545 Low back pain: Secondary | ICD-10-CM | POA: Diagnosis not present

## 2015-05-16 DIAGNOSIS — M6281 Muscle weakness (generalized): Secondary | ICD-10-CM | POA: Diagnosis not present

## 2015-05-16 DIAGNOSIS — R2689 Other abnormalities of gait and mobility: Secondary | ICD-10-CM | POA: Diagnosis not present

## 2015-05-21 DIAGNOSIS — M6281 Muscle weakness (generalized): Secondary | ICD-10-CM | POA: Diagnosis not present

## 2015-05-21 DIAGNOSIS — M545 Low back pain: Secondary | ICD-10-CM | POA: Diagnosis not present

## 2015-05-21 DIAGNOSIS — R2689 Other abnormalities of gait and mobility: Secondary | ICD-10-CM | POA: Diagnosis not present

## 2015-05-23 DIAGNOSIS — R2689 Other abnormalities of gait and mobility: Secondary | ICD-10-CM | POA: Diagnosis not present

## 2015-05-23 DIAGNOSIS — M6281 Muscle weakness (generalized): Secondary | ICD-10-CM | POA: Diagnosis not present

## 2015-05-23 DIAGNOSIS — M545 Low back pain: Secondary | ICD-10-CM | POA: Diagnosis not present

## 2015-05-28 DIAGNOSIS — M545 Low back pain: Secondary | ICD-10-CM | POA: Diagnosis not present

## 2015-05-28 DIAGNOSIS — M6281 Muscle weakness (generalized): Secondary | ICD-10-CM | POA: Diagnosis not present

## 2015-05-28 DIAGNOSIS — R2689 Other abnormalities of gait and mobility: Secondary | ICD-10-CM | POA: Diagnosis not present

## 2015-05-30 DIAGNOSIS — I10 Essential (primary) hypertension: Secondary | ICD-10-CM | POA: Diagnosis not present

## 2015-05-30 DIAGNOSIS — Z0100 Encounter for examination of eyes and vision without abnormal findings: Secondary | ICD-10-CM | POA: Diagnosis not present

## 2015-05-30 DIAGNOSIS — H524 Presbyopia: Secondary | ICD-10-CM | POA: Diagnosis not present

## 2015-05-30 DIAGNOSIS — R2689 Other abnormalities of gait and mobility: Secondary | ICD-10-CM | POA: Diagnosis not present

## 2015-05-30 DIAGNOSIS — H52223 Regular astigmatism, bilateral: Secondary | ICD-10-CM | POA: Diagnosis not present

## 2015-05-30 DIAGNOSIS — H47093 Other disorders of optic nerve, not elsewhere classified, bilateral: Secondary | ICD-10-CM | POA: Diagnosis not present

## 2015-05-30 DIAGNOSIS — H25813 Combined forms of age-related cataract, bilateral: Secondary | ICD-10-CM | POA: Diagnosis not present

## 2015-05-30 DIAGNOSIS — M6281 Muscle weakness (generalized): Secondary | ICD-10-CM | POA: Diagnosis not present

## 2015-05-30 DIAGNOSIS — M545 Low back pain: Secondary | ICD-10-CM | POA: Diagnosis not present

## 2015-05-30 DIAGNOSIS — H5213 Myopia, bilateral: Secondary | ICD-10-CM | POA: Diagnosis not present

## 2015-06-03 DIAGNOSIS — Z79899 Other long term (current) drug therapy: Secondary | ICD-10-CM | POA: Diagnosis not present

## 2015-06-03 DIAGNOSIS — Z136 Encounter for screening for cardiovascular disorders: Secondary | ICD-10-CM | POA: Diagnosis not present

## 2015-06-03 DIAGNOSIS — Z6827 Body mass index (BMI) 27.0-27.9, adult: Secondary | ICD-10-CM | POA: Diagnosis not present

## 2015-06-03 DIAGNOSIS — I10 Essential (primary) hypertension: Secondary | ICD-10-CM | POA: Diagnosis not present

## 2015-06-03 DIAGNOSIS — Z9181 History of falling: Secondary | ICD-10-CM | POA: Diagnosis not present

## 2015-06-03 DIAGNOSIS — R1084 Generalized abdominal pain: Secondary | ICD-10-CM | POA: Diagnosis not present

## 2015-06-03 DIAGNOSIS — E559 Vitamin D deficiency, unspecified: Secondary | ICD-10-CM | POA: Diagnosis not present

## 2015-06-03 DIAGNOSIS — Z1389 Encounter for screening for other disorder: Secondary | ICD-10-CM | POA: Diagnosis not present

## 2015-06-03 DIAGNOSIS — M109 Gout, unspecified: Secondary | ICD-10-CM | POA: Diagnosis not present

## 2015-06-03 DIAGNOSIS — E7800 Pure hypercholesterolemia, unspecified: Secondary | ICD-10-CM | POA: Diagnosis not present

## 2015-06-03 DIAGNOSIS — Z0000 Encounter for general adult medical examination without abnormal findings: Secondary | ICD-10-CM | POA: Diagnosis not present

## 2015-06-04 DIAGNOSIS — M6281 Muscle weakness (generalized): Secondary | ICD-10-CM | POA: Diagnosis not present

## 2015-06-04 DIAGNOSIS — R2689 Other abnormalities of gait and mobility: Secondary | ICD-10-CM | POA: Diagnosis not present

## 2015-06-04 DIAGNOSIS — M545 Low back pain: Secondary | ICD-10-CM | POA: Diagnosis not present

## 2015-06-06 DIAGNOSIS — R2689 Other abnormalities of gait and mobility: Secondary | ICD-10-CM | POA: Diagnosis not present

## 2015-06-06 DIAGNOSIS — M545 Low back pain: Secondary | ICD-10-CM | POA: Diagnosis not present

## 2015-06-06 DIAGNOSIS — M6281 Muscle weakness (generalized): Secondary | ICD-10-CM | POA: Diagnosis not present

## 2015-06-06 DIAGNOSIS — R42 Dizziness and giddiness: Secondary | ICD-10-CM | POA: Diagnosis not present

## 2015-06-07 DIAGNOSIS — Z1231 Encounter for screening mammogram for malignant neoplasm of breast: Secondary | ICD-10-CM | POA: Diagnosis not present

## 2015-06-11 DIAGNOSIS — M545 Low back pain: Secondary | ICD-10-CM | POA: Diagnosis not present

## 2015-06-11 DIAGNOSIS — M6281 Muscle weakness (generalized): Secondary | ICD-10-CM | POA: Diagnosis not present

## 2015-06-11 DIAGNOSIS — R2689 Other abnormalities of gait and mobility: Secondary | ICD-10-CM | POA: Diagnosis not present

## 2015-06-13 DIAGNOSIS — M545 Low back pain: Secondary | ICD-10-CM | POA: Diagnosis not present

## 2015-06-13 DIAGNOSIS — R2689 Other abnormalities of gait and mobility: Secondary | ICD-10-CM | POA: Diagnosis not present

## 2015-06-13 DIAGNOSIS — M6281 Muscle weakness (generalized): Secondary | ICD-10-CM | POA: Diagnosis not present

## 2015-06-18 DIAGNOSIS — R2689 Other abnormalities of gait and mobility: Secondary | ICD-10-CM | POA: Diagnosis not present

## 2015-06-18 DIAGNOSIS — M6281 Muscle weakness (generalized): Secondary | ICD-10-CM | POA: Diagnosis not present

## 2015-06-18 DIAGNOSIS — M545 Low back pain: Secondary | ICD-10-CM | POA: Diagnosis not present

## 2015-06-20 DIAGNOSIS — E7800 Pure hypercholesterolemia, unspecified: Secondary | ICD-10-CM | POA: Diagnosis not present

## 2015-06-20 DIAGNOSIS — K219 Gastro-esophageal reflux disease without esophagitis: Secondary | ICD-10-CM | POA: Diagnosis not present

## 2015-06-20 DIAGNOSIS — Z79899 Other long term (current) drug therapy: Secondary | ICD-10-CM | POA: Diagnosis not present

## 2015-06-20 DIAGNOSIS — R69 Illness, unspecified: Secondary | ICD-10-CM | POA: Diagnosis not present

## 2015-06-20 DIAGNOSIS — M109 Gout, unspecified: Secondary | ICD-10-CM | POA: Diagnosis not present

## 2015-06-20 DIAGNOSIS — E559 Vitamin D deficiency, unspecified: Secondary | ICD-10-CM | POA: Diagnosis not present

## 2015-06-20 DIAGNOSIS — I10 Essential (primary) hypertension: Secondary | ICD-10-CM | POA: Diagnosis not present

## 2015-07-25 DIAGNOSIS — N183 Chronic kidney disease, stage 3 (moderate): Secondary | ICD-10-CM | POA: Diagnosis not present

## 2015-07-25 DIAGNOSIS — E559 Vitamin D deficiency, unspecified: Secondary | ICD-10-CM | POA: Diagnosis not present

## 2015-07-25 DIAGNOSIS — I129 Hypertensive chronic kidney disease with stage 1 through stage 4 chronic kidney disease, or unspecified chronic kidney disease: Secondary | ICD-10-CM | POA: Diagnosis not present

## 2015-07-29 DIAGNOSIS — M2550 Pain in unspecified joint: Secondary | ICD-10-CM | POA: Diagnosis not present

## 2015-07-29 DIAGNOSIS — I129 Hypertensive chronic kidney disease with stage 1 through stage 4 chronic kidney disease, or unspecified chronic kidney disease: Secondary | ICD-10-CM | POA: Diagnosis not present

## 2015-07-29 DIAGNOSIS — E889 Metabolic disorder, unspecified: Secondary | ICD-10-CM | POA: Diagnosis not present

## 2015-07-29 DIAGNOSIS — N183 Chronic kidney disease, stage 3 (moderate): Secondary | ICD-10-CM | POA: Diagnosis not present

## 2015-07-29 DIAGNOSIS — E559 Vitamin D deficiency, unspecified: Secondary | ICD-10-CM | POA: Diagnosis not present

## 2015-07-29 DIAGNOSIS — M9080 Osteopathy in diseases classified elsewhere, unspecified site: Secondary | ICD-10-CM | POA: Diagnosis not present

## 2015-08-10 DIAGNOSIS — M791 Myalgia: Secondary | ICD-10-CM | POA: Diagnosis not present

## 2015-08-10 DIAGNOSIS — M549 Dorsalgia, unspecified: Secondary | ICD-10-CM | POA: Diagnosis not present

## 2015-08-10 DIAGNOSIS — M79605 Pain in left leg: Secondary | ICD-10-CM | POA: Diagnosis not present

## 2015-08-10 DIAGNOSIS — R531 Weakness: Secondary | ICD-10-CM | POA: Diagnosis not present

## 2015-08-10 DIAGNOSIS — M79604 Pain in right leg: Secondary | ICD-10-CM | POA: Diagnosis not present

## 2015-08-19 DIAGNOSIS — I951 Orthostatic hypotension: Secondary | ICD-10-CM | POA: Diagnosis not present

## 2015-08-19 DIAGNOSIS — R251 Tremor, unspecified: Secondary | ICD-10-CM | POA: Diagnosis not present

## 2015-08-19 DIAGNOSIS — R69 Illness, unspecified: Secondary | ICD-10-CM | POA: Diagnosis not present

## 2015-08-19 DIAGNOSIS — E1140 Type 2 diabetes mellitus with diabetic neuropathy, unspecified: Secondary | ICD-10-CM | POA: Diagnosis not present

## 2015-08-19 DIAGNOSIS — M1288 Other specific arthropathies, not elsewhere classified, other specified site: Secondary | ICD-10-CM | POA: Diagnosis not present

## 2015-08-27 DIAGNOSIS — M1288 Other specific arthropathies, not elsewhere classified, other specified site: Secondary | ICD-10-CM | POA: Diagnosis not present

## 2015-09-02 DIAGNOSIS — R1084 Generalized abdominal pain: Secondary | ICD-10-CM | POA: Diagnosis not present

## 2015-09-02 DIAGNOSIS — I10 Essential (primary) hypertension: Secondary | ICD-10-CM | POA: Diagnosis not present

## 2015-09-02 DIAGNOSIS — K5909 Other constipation: Secondary | ICD-10-CM | POA: Diagnosis not present

## 2015-09-02 DIAGNOSIS — Z87898 Personal history of other specified conditions: Secondary | ICD-10-CM | POA: Diagnosis not present

## 2015-09-22 DIAGNOSIS — S29012A Strain of muscle and tendon of back wall of thorax, initial encounter: Secondary | ICD-10-CM | POA: Diagnosis not present

## 2015-09-22 DIAGNOSIS — S3991XA Unspecified injury of abdomen, initial encounter: Secondary | ICD-10-CM | POA: Diagnosis not present

## 2015-09-22 DIAGNOSIS — S3993XA Unspecified injury of pelvis, initial encounter: Secondary | ICD-10-CM | POA: Diagnosis not present

## 2015-09-22 DIAGNOSIS — M545 Low back pain: Secondary | ICD-10-CM | POA: Diagnosis not present

## 2015-09-22 DIAGNOSIS — S39012A Strain of muscle, fascia and tendon of lower back, initial encounter: Secondary | ICD-10-CM | POA: Diagnosis not present

## 2015-09-22 DIAGNOSIS — M546 Pain in thoracic spine: Secondary | ICD-10-CM | POA: Diagnosis not present

## 2015-09-27 DIAGNOSIS — R42 Dizziness and giddiness: Secondary | ICD-10-CM | POA: Diagnosis not present

## 2015-09-27 DIAGNOSIS — R079 Chest pain, unspecified: Secondary | ICD-10-CM | POA: Diagnosis not present

## 2015-10-04 DIAGNOSIS — R69 Illness, unspecified: Secondary | ICD-10-CM | POA: Diagnosis not present

## 2015-10-04 DIAGNOSIS — R251 Tremor, unspecified: Secondary | ICD-10-CM | POA: Diagnosis not present

## 2015-10-04 DIAGNOSIS — M47816 Spondylosis without myelopathy or radiculopathy, lumbar region: Secondary | ICD-10-CM | POA: Diagnosis not present

## 2015-10-22 DIAGNOSIS — M47816 Spondylosis without myelopathy or radiculopathy, lumbar region: Secondary | ICD-10-CM | POA: Diagnosis not present

## 2015-10-30 DIAGNOSIS — M353 Polymyalgia rheumatica: Secondary | ICD-10-CM | POA: Diagnosis not present

## 2015-10-30 DIAGNOSIS — K219 Gastro-esophageal reflux disease without esophagitis: Secondary | ICD-10-CM | POA: Diagnosis not present

## 2015-10-30 DIAGNOSIS — I10 Essential (primary) hypertension: Secondary | ICD-10-CM | POA: Diagnosis not present

## 2015-10-30 DIAGNOSIS — R69 Illness, unspecified: Secondary | ICD-10-CM | POA: Diagnosis not present

## 2015-10-30 DIAGNOSIS — G319 Degenerative disease of nervous system, unspecified: Secondary | ICD-10-CM | POA: Diagnosis not present

## 2015-10-30 DIAGNOSIS — E7801 Familial hypercholesterolemia: Secondary | ICD-10-CM | POA: Diagnosis not present

## 2015-10-30 DIAGNOSIS — Z0000 Encounter for general adult medical examination without abnormal findings: Secondary | ICD-10-CM | POA: Diagnosis not present

## 2015-10-30 DIAGNOSIS — J42 Unspecified chronic bronchitis: Secondary | ICD-10-CM | POA: Diagnosis not present

## 2015-11-01 DIAGNOSIS — G894 Chronic pain syndrome: Secondary | ICD-10-CM | POA: Diagnosis not present

## 2015-11-01 DIAGNOSIS — R69 Illness, unspecified: Secondary | ICD-10-CM | POA: Diagnosis not present

## 2015-11-01 DIAGNOSIS — G301 Alzheimer's disease with late onset: Secondary | ICD-10-CM | POA: Diagnosis not present

## 2015-11-01 DIAGNOSIS — M545 Low back pain: Secondary | ICD-10-CM | POA: Diagnosis not present

## 2015-11-05 DIAGNOSIS — M2550 Pain in unspecified joint: Secondary | ICD-10-CM | POA: Diagnosis not present

## 2015-11-05 DIAGNOSIS — Z23 Encounter for immunization: Secondary | ICD-10-CM | POA: Diagnosis not present

## 2015-11-05 DIAGNOSIS — M109 Gout, unspecified: Secondary | ICD-10-CM | POA: Diagnosis not present

## 2015-11-29 DIAGNOSIS — I129 Hypertensive chronic kidney disease with stage 1 through stage 4 chronic kidney disease, or unspecified chronic kidney disease: Secondary | ICD-10-CM | POA: Diagnosis not present

## 2015-11-29 DIAGNOSIS — E889 Metabolic disorder, unspecified: Secondary | ICD-10-CM | POA: Diagnosis not present

## 2015-11-29 DIAGNOSIS — E559 Vitamin D deficiency, unspecified: Secondary | ICD-10-CM | POA: Diagnosis not present

## 2015-11-29 DIAGNOSIS — N183 Chronic kidney disease, stage 3 (moderate): Secondary | ICD-10-CM | POA: Diagnosis not present

## 2015-11-29 DIAGNOSIS — M9080 Osteopathy in diseases classified elsewhere, unspecified site: Secondary | ICD-10-CM | POA: Diagnosis not present

## 2015-12-02 DIAGNOSIS — M25461 Effusion, right knee: Secondary | ICD-10-CM | POA: Diagnosis not present

## 2015-12-02 DIAGNOSIS — M25562 Pain in left knee: Secondary | ICD-10-CM | POA: Diagnosis not present

## 2015-12-02 DIAGNOSIS — M1711 Unilateral primary osteoarthritis, right knee: Secondary | ICD-10-CM | POA: Diagnosis not present

## 2015-12-02 DIAGNOSIS — M25561 Pain in right knee: Secondary | ICD-10-CM | POA: Diagnosis not present

## 2015-12-02 DIAGNOSIS — G8929 Other chronic pain: Secondary | ICD-10-CM | POA: Diagnosis not present

## 2015-12-02 DIAGNOSIS — T8484XA Pain due to internal orthopedic prosthetic devices, implants and grafts, initial encounter: Secondary | ICD-10-CM | POA: Diagnosis not present

## 2015-12-02 DIAGNOSIS — Z96652 Presence of left artificial knee joint: Secondary | ICD-10-CM | POA: Diagnosis not present

## 2015-12-03 DIAGNOSIS — I129 Hypertensive chronic kidney disease with stage 1 through stage 4 chronic kidney disease, or unspecified chronic kidney disease: Secondary | ICD-10-CM | POA: Diagnosis not present

## 2015-12-03 DIAGNOSIS — N183 Chronic kidney disease, stage 3 (moderate): Secondary | ICD-10-CM | POA: Diagnosis not present

## 2015-12-03 DIAGNOSIS — M9080 Osteopathy in diseases classified elsewhere, unspecified site: Secondary | ICD-10-CM | POA: Diagnosis not present

## 2015-12-03 DIAGNOSIS — M170 Bilateral primary osteoarthritis of knee: Secondary | ICD-10-CM

## 2015-12-03 DIAGNOSIS — E889 Metabolic disorder, unspecified: Secondary | ICD-10-CM | POA: Diagnosis not present

## 2015-12-03 DIAGNOSIS — E559 Vitamin D deficiency, unspecified: Secondary | ICD-10-CM | POA: Diagnosis not present

## 2015-12-03 HISTORY — DX: Bilateral primary osteoarthritis of knee: M17.0

## 2015-12-04 DIAGNOSIS — M1711 Unilateral primary osteoarthritis, right knee: Secondary | ICD-10-CM

## 2015-12-04 DIAGNOSIS — T8484XA Pain due to internal orthopedic prosthetic devices, implants and grafts, initial encounter: Secondary | ICD-10-CM | POA: Insufficient documentation

## 2015-12-04 DIAGNOSIS — Z96652 Presence of left artificial knee joint: Secondary | ICD-10-CM

## 2015-12-04 HISTORY — DX: Pain due to internal orthopedic prosthetic devices, implants and grafts, initial encounter: Z96.652

## 2015-12-04 HISTORY — DX: Unilateral primary osteoarthritis, right knee: M17.11

## 2015-12-04 HISTORY — DX: Pain due to internal orthopedic prosthetic devices, implants and grafts, initial encounter: T84.84XA

## 2015-12-05 DIAGNOSIS — Z1389 Encounter for screening for other disorder: Secondary | ICD-10-CM | POA: Diagnosis not present

## 2015-12-05 DIAGNOSIS — E559 Vitamin D deficiency, unspecified: Secondary | ICD-10-CM | POA: Diagnosis not present

## 2015-12-05 DIAGNOSIS — Z0000 Encounter for general adult medical examination without abnormal findings: Secondary | ICD-10-CM | POA: Diagnosis not present

## 2015-12-05 DIAGNOSIS — I10 Essential (primary) hypertension: Secondary | ICD-10-CM | POA: Diagnosis not present

## 2015-12-05 DIAGNOSIS — Z1211 Encounter for screening for malignant neoplasm of colon: Secondary | ICD-10-CM | POA: Diagnosis not present

## 2015-12-05 DIAGNOSIS — Z79899 Other long term (current) drug therapy: Secondary | ICD-10-CM | POA: Diagnosis not present

## 2015-12-05 DIAGNOSIS — E663 Overweight: Secondary | ICD-10-CM | POA: Diagnosis not present

## 2015-12-05 DIAGNOSIS — E785 Hyperlipidemia, unspecified: Secondary | ICD-10-CM | POA: Diagnosis not present

## 2015-12-05 DIAGNOSIS — Z9181 History of falling: Secondary | ICD-10-CM | POA: Diagnosis not present

## 2015-12-05 DIAGNOSIS — M109 Gout, unspecified: Secondary | ICD-10-CM | POA: Diagnosis not present

## 2015-12-09 DIAGNOSIS — M25562 Pain in left knee: Secondary | ICD-10-CM | POA: Diagnosis not present

## 2015-12-09 DIAGNOSIS — F419 Anxiety disorder, unspecified: Secondary | ICD-10-CM | POA: Insufficient documentation

## 2015-12-09 HISTORY — DX: Anxiety disorder, unspecified: F41.9

## 2015-12-16 DIAGNOSIS — Z87898 Personal history of other specified conditions: Secondary | ICD-10-CM | POA: Diagnosis not present

## 2015-12-17 DIAGNOSIS — T8484XD Pain due to internal orthopedic prosthetic devices, implants and grafts, subsequent encounter: Secondary | ICD-10-CM | POA: Diagnosis not present

## 2015-12-17 DIAGNOSIS — Z96652 Presence of left artificial knee joint: Secondary | ICD-10-CM | POA: Diagnosis not present

## 2015-12-17 DIAGNOSIS — M1711 Unilateral primary osteoarthritis, right knee: Secondary | ICD-10-CM | POA: Diagnosis not present

## 2016-01-01 DIAGNOSIS — M545 Low back pain: Secondary | ICD-10-CM | POA: Diagnosis not present

## 2016-01-01 DIAGNOSIS — R69 Illness, unspecified: Secondary | ICD-10-CM | POA: Diagnosis not present

## 2016-01-01 DIAGNOSIS — G301 Alzheimer's disease with late onset: Secondary | ICD-10-CM | POA: Diagnosis not present

## 2016-01-10 DIAGNOSIS — R69 Illness, unspecified: Secondary | ICD-10-CM | POA: Diagnosis not present

## 2016-02-11 DIAGNOSIS — R531 Weakness: Secondary | ICD-10-CM | POA: Diagnosis not present

## 2016-02-11 DIAGNOSIS — R51 Headache: Secondary | ICD-10-CM | POA: Diagnosis not present

## 2016-02-11 DIAGNOSIS — B349 Viral infection, unspecified: Secondary | ICD-10-CM | POA: Diagnosis not present

## 2016-02-11 DIAGNOSIS — M549 Dorsalgia, unspecified: Secondary | ICD-10-CM | POA: Diagnosis not present

## 2016-02-11 DIAGNOSIS — R05 Cough: Secondary | ICD-10-CM | POA: Diagnosis not present

## 2016-02-11 DIAGNOSIS — R509 Fever, unspecified: Secondary | ICD-10-CM | POA: Diagnosis not present

## 2016-02-11 DIAGNOSIS — R112 Nausea with vomiting, unspecified: Secondary | ICD-10-CM | POA: Diagnosis not present

## 2016-02-11 DIAGNOSIS — E860 Dehydration: Secondary | ICD-10-CM | POA: Diagnosis not present

## 2016-03-10 DIAGNOSIS — M7551 Bursitis of right shoulder: Secondary | ICD-10-CM | POA: Diagnosis not present

## 2016-03-24 DIAGNOSIS — N183 Chronic kidney disease, stage 3 (moderate): Secondary | ICD-10-CM | POA: Diagnosis not present

## 2016-03-26 DIAGNOSIS — M9080 Osteopathy in diseases classified elsewhere, unspecified site: Secondary | ICD-10-CM | POA: Diagnosis not present

## 2016-03-26 DIAGNOSIS — M170 Bilateral primary osteoarthritis of knee: Secondary | ICD-10-CM | POA: Diagnosis not present

## 2016-03-26 DIAGNOSIS — N182 Chronic kidney disease, stage 2 (mild): Secondary | ICD-10-CM | POA: Diagnosis not present

## 2016-03-26 DIAGNOSIS — E889 Metabolic disorder, unspecified: Secondary | ICD-10-CM | POA: Diagnosis not present

## 2016-03-26 DIAGNOSIS — I129 Hypertensive chronic kidney disease with stage 1 through stage 4 chronic kidney disease, or unspecified chronic kidney disease: Secondary | ICD-10-CM | POA: Diagnosis not present

## 2016-03-26 DIAGNOSIS — E559 Vitamin D deficiency, unspecified: Secondary | ICD-10-CM | POA: Diagnosis not present

## 2016-03-30 DIAGNOSIS — M7541 Impingement syndrome of right shoulder: Secondary | ICD-10-CM | POA: Diagnosis not present

## 2016-03-30 DIAGNOSIS — M25511 Pain in right shoulder: Secondary | ICD-10-CM | POA: Diagnosis not present

## 2016-03-30 DIAGNOSIS — M7501 Adhesive capsulitis of right shoulder: Secondary | ICD-10-CM | POA: Diagnosis not present

## 2016-03-31 DIAGNOSIS — M7541 Impingement syndrome of right shoulder: Secondary | ICD-10-CM | POA: Insufficient documentation

## 2016-03-31 DIAGNOSIS — M7501 Adhesive capsulitis of right shoulder: Secondary | ICD-10-CM

## 2016-03-31 HISTORY — DX: Impingement syndrome of right shoulder: M75.41

## 2016-03-31 HISTORY — DX: Adhesive capsulitis of right shoulder: M75.01

## 2016-04-01 DIAGNOSIS — R69 Illness, unspecified: Secondary | ICD-10-CM | POA: Diagnosis not present

## 2016-04-01 DIAGNOSIS — R251 Tremor, unspecified: Secondary | ICD-10-CM | POA: Diagnosis not present

## 2016-04-01 DIAGNOSIS — G301 Alzheimer's disease with late onset: Secondary | ICD-10-CM | POA: Diagnosis not present

## 2016-04-15 DIAGNOSIS — E559 Vitamin D deficiency, unspecified: Secondary | ICD-10-CM | POA: Diagnosis not present

## 2016-04-15 DIAGNOSIS — I10 Essential (primary) hypertension: Secondary | ICD-10-CM | POA: Diagnosis not present

## 2016-04-15 DIAGNOSIS — Z79899 Other long term (current) drug therapy: Secondary | ICD-10-CM | POA: Diagnosis not present

## 2016-04-15 DIAGNOSIS — R7303 Prediabetes: Secondary | ICD-10-CM | POA: Diagnosis not present

## 2016-04-15 DIAGNOSIS — M109 Gout, unspecified: Secondary | ICD-10-CM | POA: Diagnosis not present

## 2016-04-15 DIAGNOSIS — E785 Hyperlipidemia, unspecified: Secondary | ICD-10-CM | POA: Diagnosis not present

## 2016-04-15 DIAGNOSIS — E663 Overweight: Secondary | ICD-10-CM | POA: Diagnosis not present

## 2016-04-15 DIAGNOSIS — J22 Unspecified acute lower respiratory infection: Secondary | ICD-10-CM | POA: Diagnosis not present

## 2016-04-22 DIAGNOSIS — E663 Overweight: Secondary | ICD-10-CM | POA: Diagnosis not present

## 2016-05-04 DIAGNOSIS — N183 Chronic kidney disease, stage 3 (moderate): Secondary | ICD-10-CM | POA: Diagnosis not present

## 2016-05-04 DIAGNOSIS — M1712 Unilateral primary osteoarthritis, left knee: Secondary | ICD-10-CM | POA: Diagnosis not present

## 2016-05-04 DIAGNOSIS — J309 Allergic rhinitis, unspecified: Secondary | ICD-10-CM | POA: Diagnosis not present

## 2016-05-04 DIAGNOSIS — R69 Illness, unspecified: Secondary | ICD-10-CM | POA: Diagnosis not present

## 2016-05-04 DIAGNOSIS — I1310 Hypertensive heart and chronic kidney disease without heart failure, with stage 1 through stage 4 chronic kidney disease, or unspecified chronic kidney disease: Secondary | ICD-10-CM | POA: Diagnosis not present

## 2016-05-04 DIAGNOSIS — G309 Alzheimer's disease, unspecified: Secondary | ICD-10-CM | POA: Diagnosis not present

## 2016-05-04 DIAGNOSIS — Z01818 Encounter for other preprocedural examination: Secondary | ICD-10-CM | POA: Diagnosis not present

## 2016-05-04 DIAGNOSIS — J45909 Unspecified asthma, uncomplicated: Secondary | ICD-10-CM | POA: Diagnosis not present

## 2016-05-12 DIAGNOSIS — I517 Cardiomegaly: Secondary | ICD-10-CM | POA: Diagnosis not present

## 2016-05-12 DIAGNOSIS — I129 Hypertensive chronic kidney disease with stage 1 through stage 4 chronic kidney disease, or unspecified chronic kidney disease: Secondary | ICD-10-CM | POA: Insufficient documentation

## 2016-05-12 DIAGNOSIS — R251 Tremor, unspecified: Secondary | ICD-10-CM | POA: Insufficient documentation

## 2016-05-12 DIAGNOSIS — J9811 Atelectasis: Secondary | ICD-10-CM | POA: Diagnosis not present

## 2016-05-12 DIAGNOSIS — M659 Synovitis and tenosynovitis, unspecified: Secondary | ICD-10-CM | POA: Diagnosis not present

## 2016-05-12 HISTORY — DX: Hypertensive chronic kidney disease with stage 1 through stage 4 chronic kidney disease, or unspecified chronic kidney disease: I12.9

## 2016-05-15 DIAGNOSIS — R42 Dizziness and giddiness: Secondary | ICD-10-CM | POA: Diagnosis not present

## 2016-05-15 DIAGNOSIS — E784 Other hyperlipidemia: Secondary | ICD-10-CM | POA: Diagnosis not present

## 2016-05-15 DIAGNOSIS — F0280 Dementia in other diseases classified elsewhere without behavioral disturbance: Secondary | ICD-10-CM | POA: Diagnosis not present

## 2016-05-15 DIAGNOSIS — R51 Headache: Secondary | ICD-10-CM | POA: Diagnosis not present

## 2016-05-15 DIAGNOSIS — R270 Ataxia, unspecified: Secondary | ICD-10-CM | POA: Diagnosis not present

## 2016-05-15 DIAGNOSIS — R4701 Aphasia: Secondary | ICD-10-CM | POA: Diagnosis not present

## 2016-05-15 DIAGNOSIS — R69 Illness, unspecified: Secondary | ICD-10-CM | POA: Diagnosis not present

## 2016-05-15 DIAGNOSIS — I6523 Occlusion and stenosis of bilateral carotid arteries: Secondary | ICD-10-CM | POA: Diagnosis not present

## 2016-05-15 DIAGNOSIS — I1310 Hypertensive heart and chronic kidney disease without heart failure, with stage 1 through stage 4 chronic kidney disease, or unspecified chronic kidney disease: Secondary | ICD-10-CM | POA: Diagnosis not present

## 2016-05-15 DIAGNOSIS — I119 Hypertensive heart disease without heart failure: Secondary | ICD-10-CM | POA: Diagnosis not present

## 2016-05-15 DIAGNOSIS — R200 Anesthesia of skin: Secondary | ICD-10-CM | POA: Diagnosis not present

## 2016-05-15 DIAGNOSIS — J45909 Unspecified asthma, uncomplicated: Secondary | ICD-10-CM | POA: Diagnosis not present

## 2016-05-15 DIAGNOSIS — G629 Polyneuropathy, unspecified: Secondary | ICD-10-CM | POA: Diagnosis not present

## 2016-05-15 DIAGNOSIS — M4800 Spinal stenosis, site unspecified: Secondary | ICD-10-CM | POA: Diagnosis not present

## 2016-05-15 DIAGNOSIS — M5137 Other intervertebral disc degeneration, lumbosacral region: Secondary | ICD-10-CM | POA: Diagnosis not present

## 2016-05-15 DIAGNOSIS — R251 Tremor, unspecified: Secondary | ICD-10-CM | POA: Diagnosis not present

## 2016-05-15 DIAGNOSIS — G459 Transient cerebral ischemic attack, unspecified: Secondary | ICD-10-CM | POA: Diagnosis not present

## 2016-05-15 DIAGNOSIS — K219 Gastro-esophageal reflux disease without esophagitis: Secondary | ICD-10-CM | POA: Diagnosis not present

## 2016-05-15 DIAGNOSIS — N182 Chronic kidney disease, stage 2 (mild): Secondary | ICD-10-CM | POA: Diagnosis not present

## 2016-05-15 DIAGNOSIS — F0390 Unspecified dementia without behavioral disturbance: Secondary | ICD-10-CM

## 2016-05-15 DIAGNOSIS — M4807 Spinal stenosis, lumbosacral region: Secondary | ICD-10-CM | POA: Diagnosis not present

## 2016-05-16 DIAGNOSIS — R69 Illness, unspecified: Secondary | ICD-10-CM | POA: Diagnosis not present

## 2016-05-16 DIAGNOSIS — F0390 Unspecified dementia without behavioral disturbance: Secondary | ICD-10-CM | POA: Diagnosis not present

## 2016-05-16 DIAGNOSIS — M5137 Other intervertebral disc degeneration, lumbosacral region: Secondary | ICD-10-CM | POA: Diagnosis not present

## 2016-05-16 DIAGNOSIS — I350 Nonrheumatic aortic (valve) stenosis: Secondary | ICD-10-CM | POA: Diagnosis not present

## 2016-05-16 DIAGNOSIS — R251 Tremor, unspecified: Secondary | ICD-10-CM

## 2016-05-16 DIAGNOSIS — G459 Transient cerebral ischemic attack, unspecified: Secondary | ICD-10-CM | POA: Diagnosis not present

## 2016-05-16 DIAGNOSIS — R4701 Aphasia: Secondary | ICD-10-CM | POA: Diagnosis not present

## 2016-05-16 DIAGNOSIS — I272 Pulmonary hypertension, unspecified: Secondary | ICD-10-CM | POA: Diagnosis not present

## 2016-05-16 DIAGNOSIS — G629 Polyneuropathy, unspecified: Secondary | ICD-10-CM | POA: Diagnosis not present

## 2016-05-16 DIAGNOSIS — R200 Anesthesia of skin: Secondary | ICD-10-CM | POA: Diagnosis not present

## 2016-05-16 DIAGNOSIS — I6523 Occlusion and stenosis of bilateral carotid arteries: Secondary | ICD-10-CM | POA: Diagnosis not present

## 2016-05-16 DIAGNOSIS — M4800 Spinal stenosis, site unspecified: Secondary | ICD-10-CM | POA: Diagnosis not present

## 2016-05-16 DIAGNOSIS — K219 Gastro-esophageal reflux disease without esophagitis: Secondary | ICD-10-CM | POA: Diagnosis not present

## 2016-05-16 DIAGNOSIS — I119 Hypertensive heart disease without heart failure: Secondary | ICD-10-CM | POA: Diagnosis not present

## 2016-05-16 DIAGNOSIS — R270 Ataxia, unspecified: Secondary | ICD-10-CM | POA: Diagnosis not present

## 2016-05-16 DIAGNOSIS — E784 Other hyperlipidemia: Secondary | ICD-10-CM | POA: Diagnosis not present

## 2016-05-17 DIAGNOSIS — I119 Hypertensive heart disease without heart failure: Secondary | ICD-10-CM | POA: Diagnosis not present

## 2016-05-17 DIAGNOSIS — F0390 Unspecified dementia without behavioral disturbance: Secondary | ICD-10-CM | POA: Diagnosis not present

## 2016-05-17 DIAGNOSIS — G459 Transient cerebral ischemic attack, unspecified: Secondary | ICD-10-CM | POA: Diagnosis not present

## 2016-05-17 DIAGNOSIS — R270 Ataxia, unspecified: Secondary | ICD-10-CM | POA: Diagnosis not present

## 2016-05-17 DIAGNOSIS — K219 Gastro-esophageal reflux disease without esophagitis: Secondary | ICD-10-CM | POA: Diagnosis not present

## 2016-05-17 DIAGNOSIS — M5137 Other intervertebral disc degeneration, lumbosacral region: Secondary | ICD-10-CM | POA: Diagnosis not present

## 2016-05-17 DIAGNOSIS — R4701 Aphasia: Secondary | ICD-10-CM | POA: Diagnosis not present

## 2016-05-17 DIAGNOSIS — E784 Other hyperlipidemia: Secondary | ICD-10-CM | POA: Diagnosis not present

## 2016-05-17 DIAGNOSIS — G629 Polyneuropathy, unspecified: Secondary | ICD-10-CM | POA: Diagnosis not present

## 2016-05-17 DIAGNOSIS — M4800 Spinal stenosis, site unspecified: Secondary | ICD-10-CM | POA: Diagnosis not present

## 2016-05-17 DIAGNOSIS — R69 Illness, unspecified: Secondary | ICD-10-CM | POA: Diagnosis not present

## 2016-05-18 DIAGNOSIS — R4701 Aphasia: Secondary | ICD-10-CM | POA: Diagnosis not present

## 2016-05-18 DIAGNOSIS — R69 Illness, unspecified: Secondary | ICD-10-CM | POA: Diagnosis not present

## 2016-05-18 DIAGNOSIS — M4800 Spinal stenosis, site unspecified: Secondary | ICD-10-CM | POA: Diagnosis not present

## 2016-05-18 DIAGNOSIS — G459 Transient cerebral ischemic attack, unspecified: Secondary | ICD-10-CM | POA: Diagnosis not present

## 2016-05-18 DIAGNOSIS — M5137 Other intervertebral disc degeneration, lumbosacral region: Secondary | ICD-10-CM | POA: Diagnosis not present

## 2016-05-18 DIAGNOSIS — I119 Hypertensive heart disease without heart failure: Secondary | ICD-10-CM | POA: Diagnosis not present

## 2016-05-18 DIAGNOSIS — K219 Gastro-esophageal reflux disease without esophagitis: Secondary | ICD-10-CM | POA: Diagnosis not present

## 2016-05-18 DIAGNOSIS — G629 Polyneuropathy, unspecified: Secondary | ICD-10-CM | POA: Diagnosis not present

## 2016-05-18 DIAGNOSIS — F0390 Unspecified dementia without behavioral disturbance: Secondary | ICD-10-CM | POA: Diagnosis not present

## 2016-05-18 DIAGNOSIS — R270 Ataxia, unspecified: Secondary | ICD-10-CM | POA: Diagnosis not present

## 2016-05-18 DIAGNOSIS — E784 Other hyperlipidemia: Secondary | ICD-10-CM | POA: Diagnosis not present

## 2016-05-19 DIAGNOSIS — E784 Other hyperlipidemia: Secondary | ICD-10-CM | POA: Diagnosis not present

## 2016-05-19 DIAGNOSIS — F329 Major depressive disorder, single episode, unspecified: Secondary | ICD-10-CM | POA: Diagnosis not present

## 2016-05-19 DIAGNOSIS — F0390 Unspecified dementia without behavioral disturbance: Secondary | ICD-10-CM | POA: Diagnosis not present

## 2016-05-19 DIAGNOSIS — I129 Hypertensive chronic kidney disease with stage 1 through stage 4 chronic kidney disease, or unspecified chronic kidney disease: Secondary | ICD-10-CM | POA: Diagnosis not present

## 2016-05-19 DIAGNOSIS — E785 Hyperlipidemia, unspecified: Secondary | ICD-10-CM | POA: Diagnosis not present

## 2016-05-19 DIAGNOSIS — I38 Endocarditis, valve unspecified: Secondary | ICD-10-CM | POA: Diagnosis not present

## 2016-05-19 DIAGNOSIS — R260 Ataxic gait: Secondary | ICD-10-CM | POA: Diagnosis not present

## 2016-05-19 DIAGNOSIS — D649 Anemia, unspecified: Secondary | ICD-10-CM | POA: Diagnosis not present

## 2016-05-19 DIAGNOSIS — T8489XA Other specified complication of internal orthopedic prosthetic devices, implants and grafts, initial encounter: Secondary | ICD-10-CM | POA: Diagnosis not present

## 2016-05-19 DIAGNOSIS — R200 Anesthesia of skin: Secondary | ICD-10-CM | POA: Diagnosis not present

## 2016-05-19 DIAGNOSIS — N390 Urinary tract infection, site not specified: Secondary | ICD-10-CM | POA: Diagnosis not present

## 2016-05-19 DIAGNOSIS — Z741 Need for assistance with personal care: Secondary | ICD-10-CM | POA: Diagnosis not present

## 2016-05-19 DIAGNOSIS — G309 Alzheimer's disease, unspecified: Secondary | ICD-10-CM | POA: Diagnosis not present

## 2016-05-19 DIAGNOSIS — H538 Other visual disturbances: Secondary | ICD-10-CM | POA: Diagnosis not present

## 2016-05-19 DIAGNOSIS — R270 Ataxia, unspecified: Secondary | ICD-10-CM | POA: Diagnosis not present

## 2016-05-19 DIAGNOSIS — Z7401 Bed confinement status: Secondary | ICD-10-CM | POA: Diagnosis not present

## 2016-05-19 DIAGNOSIS — M5137 Other intervertebral disc degeneration, lumbosacral region: Secondary | ICD-10-CM | POA: Diagnosis not present

## 2016-05-19 DIAGNOSIS — I119 Hypertensive heart disease without heart failure: Secondary | ICD-10-CM | POA: Diagnosis not present

## 2016-05-19 DIAGNOSIS — M4800 Spinal stenosis, site unspecified: Secondary | ICD-10-CM | POA: Diagnosis not present

## 2016-05-19 DIAGNOSIS — M65862 Other synovitis and tenosynovitis, left lower leg: Secondary | ICD-10-CM | POA: Diagnosis not present

## 2016-05-19 DIAGNOSIS — I10 Essential (primary) hypertension: Secondary | ICD-10-CM | POA: Diagnosis not present

## 2016-05-19 DIAGNOSIS — M659 Synovitis and tenosynovitis, unspecified: Secondary | ICD-10-CM | POA: Diagnosis not present

## 2016-05-19 DIAGNOSIS — R278 Other lack of coordination: Secondary | ICD-10-CM | POA: Diagnosis not present

## 2016-05-19 DIAGNOSIS — R279 Unspecified lack of coordination: Secondary | ICD-10-CM | POA: Diagnosis not present

## 2016-05-19 DIAGNOSIS — R69 Illness, unspecified: Secondary | ICD-10-CM | POA: Diagnosis not present

## 2016-05-19 DIAGNOSIS — K219 Gastro-esophageal reflux disease without esophagitis: Secondary | ICD-10-CM | POA: Diagnosis not present

## 2016-05-19 DIAGNOSIS — R4701 Aphasia: Secondary | ICD-10-CM | POA: Diagnosis not present

## 2016-05-19 DIAGNOSIS — N189 Chronic kidney disease, unspecified: Secondary | ICD-10-CM | POA: Diagnosis not present

## 2016-05-19 DIAGNOSIS — R319 Hematuria, unspecified: Secondary | ICD-10-CM | POA: Diagnosis not present

## 2016-05-19 DIAGNOSIS — N183 Chronic kidney disease, stage 3 (moderate): Secondary | ICD-10-CM | POA: Diagnosis not present

## 2016-05-19 DIAGNOSIS — M4807 Spinal stenosis, lumbosacral region: Secondary | ICD-10-CM | POA: Diagnosis not present

## 2016-05-19 DIAGNOSIS — G459 Transient cerebral ischemic attack, unspecified: Secondary | ICD-10-CM | POA: Diagnosis not present

## 2016-05-19 DIAGNOSIS — Z79899 Other long term (current) drug therapy: Secondary | ICD-10-CM | POA: Diagnosis not present

## 2016-05-19 DIAGNOSIS — Z96652 Presence of left artificial knee joint: Secondary | ICD-10-CM | POA: Diagnosis not present

## 2016-05-19 DIAGNOSIS — G629 Polyneuropathy, unspecified: Secondary | ICD-10-CM | POA: Diagnosis not present

## 2016-05-20 DIAGNOSIS — G459 Transient cerebral ischemic attack, unspecified: Secondary | ICD-10-CM | POA: Diagnosis not present

## 2016-05-20 DIAGNOSIS — I119 Hypertensive heart disease without heart failure: Secondary | ICD-10-CM | POA: Diagnosis not present

## 2016-05-20 DIAGNOSIS — R69 Illness, unspecified: Secondary | ICD-10-CM | POA: Diagnosis not present

## 2016-05-20 DIAGNOSIS — R260 Ataxic gait: Secondary | ICD-10-CM | POA: Diagnosis not present

## 2016-05-21 DIAGNOSIS — I10 Essential (primary) hypertension: Secondary | ICD-10-CM | POA: Diagnosis not present

## 2016-05-21 DIAGNOSIS — M4807 Spinal stenosis, lumbosacral region: Secondary | ICD-10-CM | POA: Diagnosis not present

## 2016-05-21 DIAGNOSIS — I38 Endocarditis, valve unspecified: Secondary | ICD-10-CM | POA: Diagnosis not present

## 2016-05-21 DIAGNOSIS — D649 Anemia, unspecified: Secondary | ICD-10-CM | POA: Diagnosis not present

## 2016-05-25 DIAGNOSIS — H538 Other visual disturbances: Secondary | ICD-10-CM | POA: Diagnosis not present

## 2016-05-25 DIAGNOSIS — N183 Chronic kidney disease, stage 3 (moderate): Secondary | ICD-10-CM | POA: Diagnosis not present

## 2016-05-25 DIAGNOSIS — G309 Alzheimer's disease, unspecified: Secondary | ICD-10-CM | POA: Diagnosis not present

## 2016-05-25 DIAGNOSIS — Z96652 Presence of left artificial knee joint: Secondary | ICD-10-CM | POA: Diagnosis not present

## 2016-05-25 DIAGNOSIS — M659 Synovitis and tenosynovitis, unspecified: Secondary | ICD-10-CM | POA: Diagnosis not present

## 2016-05-25 DIAGNOSIS — I129 Hypertensive chronic kidney disease with stage 1 through stage 4 chronic kidney disease, or unspecified chronic kidney disease: Secondary | ICD-10-CM | POA: Diagnosis not present

## 2016-05-25 DIAGNOSIS — R2689 Other abnormalities of gait and mobility: Secondary | ICD-10-CM | POA: Diagnosis not present

## 2016-05-25 DIAGNOSIS — N189 Chronic kidney disease, unspecified: Secondary | ICD-10-CM | POA: Diagnosis not present

## 2016-05-25 DIAGNOSIS — T8489XA Other specified complication of internal orthopedic prosthetic devices, implants and grafts, initial encounter: Secondary | ICD-10-CM | POA: Diagnosis not present

## 2016-05-25 DIAGNOSIS — M62838 Other muscle spasm: Secondary | ICD-10-CM | POA: Diagnosis not present

## 2016-05-25 DIAGNOSIS — R69 Illness, unspecified: Secondary | ICD-10-CM | POA: Diagnosis not present

## 2016-05-25 DIAGNOSIS — M65862 Other synovitis and tenosynovitis, left lower leg: Secondary | ICD-10-CM | POA: Diagnosis not present

## 2016-05-25 DIAGNOSIS — R200 Anesthesia of skin: Secondary | ICD-10-CM | POA: Diagnosis not present

## 2016-05-25 DIAGNOSIS — R270 Ataxia, unspecified: Secondary | ICD-10-CM | POA: Diagnosis not present

## 2016-05-29 DIAGNOSIS — H25813 Combined forms of age-related cataract, bilateral: Secondary | ICD-10-CM | POA: Diagnosis not present

## 2016-05-29 DIAGNOSIS — M659 Synovitis and tenosynovitis, unspecified: Secondary | ICD-10-CM | POA: Diagnosis not present

## 2016-05-29 DIAGNOSIS — I129 Hypertensive chronic kidney disease with stage 1 through stage 4 chronic kidney disease, or unspecified chronic kidney disease: Secondary | ICD-10-CM | POA: Diagnosis not present

## 2016-05-29 DIAGNOSIS — M62838 Other muscle spasm: Secondary | ICD-10-CM | POA: Diagnosis not present

## 2016-05-29 DIAGNOSIS — R300 Dysuria: Secondary | ICD-10-CM | POA: Diagnosis not present

## 2016-05-29 DIAGNOSIS — R69 Illness, unspecified: Secondary | ICD-10-CM | POA: Diagnosis not present

## 2016-05-29 DIAGNOSIS — N183 Chronic kidney disease, stage 3 (moderate): Secondary | ICD-10-CM | POA: Diagnosis not present

## 2016-05-29 DIAGNOSIS — H5213 Myopia, bilateral: Secondary | ICD-10-CM | POA: Diagnosis not present

## 2016-05-29 DIAGNOSIS — R270 Ataxia, unspecified: Secondary | ICD-10-CM | POA: Diagnosis not present

## 2016-05-29 DIAGNOSIS — H43813 Vitreous degeneration, bilateral: Secondary | ICD-10-CM | POA: Diagnosis not present

## 2016-05-29 DIAGNOSIS — H524 Presbyopia: Secondary | ICD-10-CM | POA: Diagnosis not present

## 2016-05-29 DIAGNOSIS — I119 Hypertensive heart disease without heart failure: Secondary | ICD-10-CM | POA: Diagnosis not present

## 2016-05-29 DIAGNOSIS — H52223 Regular astigmatism, bilateral: Secondary | ICD-10-CM | POA: Diagnosis not present

## 2016-05-29 DIAGNOSIS — N390 Urinary tract infection, site not specified: Secondary | ICD-10-CM | POA: Diagnosis not present

## 2016-05-29 DIAGNOSIS — Z96652 Presence of left artificial knee joint: Secondary | ICD-10-CM | POA: Diagnosis not present

## 2016-05-29 DIAGNOSIS — G459 Transient cerebral ischemic attack, unspecified: Secondary | ICD-10-CM | POA: Diagnosis not present

## 2016-05-29 DIAGNOSIS — H43313 Vitreous membranes and strands, bilateral: Secondary | ICD-10-CM | POA: Diagnosis not present

## 2016-05-29 DIAGNOSIS — R319 Hematuria, unspecified: Secondary | ICD-10-CM | POA: Diagnosis not present

## 2016-05-29 DIAGNOSIS — M25562 Pain in left knee: Secondary | ICD-10-CM | POA: Diagnosis not present

## 2016-05-29 DIAGNOSIS — G309 Alzheimer's disease, unspecified: Secondary | ICD-10-CM | POA: Diagnosis not present

## 2016-05-29 DIAGNOSIS — E784 Other hyperlipidemia: Secondary | ICD-10-CM | POA: Diagnosis not present

## 2016-05-29 DIAGNOSIS — R2689 Other abnormalities of gait and mobility: Secondary | ICD-10-CM | POA: Diagnosis not present

## 2016-05-29 DIAGNOSIS — I10 Essential (primary) hypertension: Secondary | ICD-10-CM | POA: Diagnosis not present

## 2016-06-03 DIAGNOSIS — G459 Transient cerebral ischemic attack, unspecified: Secondary | ICD-10-CM | POA: Diagnosis not present

## 2016-06-03 DIAGNOSIS — E784 Other hyperlipidemia: Secondary | ICD-10-CM | POA: Diagnosis not present

## 2016-06-03 DIAGNOSIS — M25562 Pain in left knee: Secondary | ICD-10-CM | POA: Diagnosis not present

## 2016-06-03 DIAGNOSIS — I119 Hypertensive heart disease without heart failure: Secondary | ICD-10-CM | POA: Diagnosis not present

## 2016-06-04 DIAGNOSIS — H43813 Vitreous degeneration, bilateral: Secondary | ICD-10-CM | POA: Diagnosis not present

## 2016-06-04 DIAGNOSIS — H524 Presbyopia: Secondary | ICD-10-CM | POA: Diagnosis not present

## 2016-06-04 DIAGNOSIS — H43313 Vitreous membranes and strands, bilateral: Secondary | ICD-10-CM | POA: Diagnosis not present

## 2016-06-04 DIAGNOSIS — H52223 Regular astigmatism, bilateral: Secondary | ICD-10-CM | POA: Diagnosis not present

## 2016-06-04 DIAGNOSIS — I10 Essential (primary) hypertension: Secondary | ICD-10-CM | POA: Diagnosis not present

## 2016-06-04 DIAGNOSIS — H5213 Myopia, bilateral: Secondary | ICD-10-CM | POA: Diagnosis not present

## 2016-06-04 DIAGNOSIS — H25813 Combined forms of age-related cataract, bilateral: Secondary | ICD-10-CM | POA: Diagnosis not present

## 2016-06-10 DIAGNOSIS — G459 Transient cerebral ischemic attack, unspecified: Secondary | ICD-10-CM | POA: Diagnosis not present

## 2016-06-10 DIAGNOSIS — I10 Essential (primary) hypertension: Secondary | ICD-10-CM | POA: Diagnosis not present

## 2016-06-10 DIAGNOSIS — R300 Dysuria: Secondary | ICD-10-CM | POA: Diagnosis not present

## 2016-06-10 DIAGNOSIS — M25562 Pain in left knee: Secondary | ICD-10-CM | POA: Diagnosis not present

## 2016-06-12 DIAGNOSIS — R278 Other lack of coordination: Secondary | ICD-10-CM | POA: Diagnosis not present

## 2016-06-12 DIAGNOSIS — Z96652 Presence of left artificial knee joint: Secondary | ICD-10-CM | POA: Diagnosis not present

## 2016-06-12 DIAGNOSIS — R69 Illness, unspecified: Secondary | ICD-10-CM | POA: Diagnosis not present

## 2016-06-12 DIAGNOSIS — M6281 Muscle weakness (generalized): Secondary | ICD-10-CM | POA: Diagnosis not present

## 2016-06-12 DIAGNOSIS — Z471 Aftercare following joint replacement surgery: Secondary | ICD-10-CM | POA: Diagnosis not present

## 2016-06-12 DIAGNOSIS — R270 Ataxia, unspecified: Secondary | ICD-10-CM | POA: Diagnosis not present

## 2016-06-12 DIAGNOSIS — G309 Alzheimer's disease, unspecified: Secondary | ICD-10-CM | POA: Diagnosis not present

## 2016-06-12 DIAGNOSIS — M4807 Spinal stenosis, lumbosacral region: Secondary | ICD-10-CM | POA: Diagnosis not present

## 2016-06-12 DIAGNOSIS — I129 Hypertensive chronic kidney disease with stage 1 through stage 4 chronic kidney disease, or unspecified chronic kidney disease: Secondary | ICD-10-CM | POA: Diagnosis not present

## 2016-06-15 DIAGNOSIS — R278 Other lack of coordination: Secondary | ICD-10-CM | POA: Diagnosis not present

## 2016-06-15 DIAGNOSIS — Z96652 Presence of left artificial knee joint: Secondary | ICD-10-CM | POA: Diagnosis not present

## 2016-06-15 DIAGNOSIS — R270 Ataxia, unspecified: Secondary | ICD-10-CM | POA: Diagnosis not present

## 2016-06-15 DIAGNOSIS — Z471 Aftercare following joint replacement surgery: Secondary | ICD-10-CM | POA: Diagnosis not present

## 2016-06-15 DIAGNOSIS — M6281 Muscle weakness (generalized): Secondary | ICD-10-CM | POA: Diagnosis not present

## 2016-06-16 DIAGNOSIS — M6281 Muscle weakness (generalized): Secondary | ICD-10-CM | POA: Diagnosis not present

## 2016-06-16 DIAGNOSIS — R270 Ataxia, unspecified: Secondary | ICD-10-CM | POA: Diagnosis not present

## 2016-06-16 DIAGNOSIS — R278 Other lack of coordination: Secondary | ICD-10-CM | POA: Diagnosis not present

## 2016-06-16 DIAGNOSIS — Z471 Aftercare following joint replacement surgery: Secondary | ICD-10-CM | POA: Diagnosis not present

## 2016-06-16 DIAGNOSIS — Z96652 Presence of left artificial knee joint: Secondary | ICD-10-CM | POA: Diagnosis not present

## 2016-06-17 DIAGNOSIS — Z79899 Other long term (current) drug therapy: Secondary | ICD-10-CM | POA: Diagnosis not present

## 2016-06-17 DIAGNOSIS — M1712 Unilateral primary osteoarthritis, left knee: Secondary | ICD-10-CM | POA: Diagnosis not present

## 2016-06-17 DIAGNOSIS — Z471 Aftercare following joint replacement surgery: Secondary | ICD-10-CM | POA: Diagnosis not present

## 2016-06-17 DIAGNOSIS — R270 Ataxia, unspecified: Secondary | ICD-10-CM | POA: Diagnosis not present

## 2016-06-17 DIAGNOSIS — Z9189 Other specified personal risk factors, not elsewhere classified: Secondary | ICD-10-CM | POA: Diagnosis not present

## 2016-06-17 DIAGNOSIS — I1310 Hypertensive heart and chronic kidney disease without heart failure, with stage 1 through stage 4 chronic kidney disease, or unspecified chronic kidney disease: Secondary | ICD-10-CM | POA: Diagnosis not present

## 2016-06-17 DIAGNOSIS — R69 Illness, unspecified: Secondary | ICD-10-CM | POA: Diagnosis not present

## 2016-06-17 DIAGNOSIS — Z96652 Presence of left artificial knee joint: Secondary | ICD-10-CM | POA: Diagnosis not present

## 2016-06-17 DIAGNOSIS — R278 Other lack of coordination: Secondary | ICD-10-CM | POA: Diagnosis not present

## 2016-06-17 DIAGNOSIS — N183 Chronic kidney disease, stage 3 (moderate): Secondary | ICD-10-CM | POA: Diagnosis not present

## 2016-06-17 DIAGNOSIS — M6281 Muscle weakness (generalized): Secondary | ICD-10-CM | POA: Diagnosis not present

## 2016-06-19 DIAGNOSIS — M79662 Pain in left lower leg: Secondary | ICD-10-CM | POA: Diagnosis not present

## 2016-06-19 DIAGNOSIS — M6281 Muscle weakness (generalized): Secondary | ICD-10-CM | POA: Diagnosis not present

## 2016-06-19 DIAGNOSIS — R278 Other lack of coordination: Secondary | ICD-10-CM | POA: Diagnosis not present

## 2016-06-19 DIAGNOSIS — R270 Ataxia, unspecified: Secondary | ICD-10-CM | POA: Diagnosis not present

## 2016-06-19 DIAGNOSIS — Z471 Aftercare following joint replacement surgery: Secondary | ICD-10-CM | POA: Diagnosis not present

## 2016-06-19 DIAGNOSIS — Z96652 Presence of left artificial knee joint: Secondary | ICD-10-CM | POA: Diagnosis not present

## 2016-06-19 DIAGNOSIS — R590 Localized enlarged lymph nodes: Secondary | ICD-10-CM | POA: Diagnosis not present

## 2016-06-22 DIAGNOSIS — R270 Ataxia, unspecified: Secondary | ICD-10-CM | POA: Diagnosis not present

## 2016-06-22 DIAGNOSIS — R278 Other lack of coordination: Secondary | ICD-10-CM | POA: Diagnosis not present

## 2016-06-22 DIAGNOSIS — Z471 Aftercare following joint replacement surgery: Secondary | ICD-10-CM | POA: Diagnosis not present

## 2016-06-22 DIAGNOSIS — Z96652 Presence of left artificial knee joint: Secondary | ICD-10-CM | POA: Diagnosis not present

## 2016-06-22 DIAGNOSIS — M6281 Muscle weakness (generalized): Secondary | ICD-10-CM | POA: Diagnosis not present

## 2016-06-23 DIAGNOSIS — M6281 Muscle weakness (generalized): Secondary | ICD-10-CM | POA: Diagnosis not present

## 2016-06-23 DIAGNOSIS — Z96652 Presence of left artificial knee joint: Secondary | ICD-10-CM | POA: Diagnosis not present

## 2016-06-23 DIAGNOSIS — R278 Other lack of coordination: Secondary | ICD-10-CM | POA: Diagnosis not present

## 2016-06-23 DIAGNOSIS — R270 Ataxia, unspecified: Secondary | ICD-10-CM | POA: Diagnosis not present

## 2016-06-23 DIAGNOSIS — Z471 Aftercare following joint replacement surgery: Secondary | ICD-10-CM | POA: Diagnosis not present

## 2016-06-24 DIAGNOSIS — R278 Other lack of coordination: Secondary | ICD-10-CM | POA: Diagnosis not present

## 2016-06-24 DIAGNOSIS — Z96652 Presence of left artificial knee joint: Secondary | ICD-10-CM | POA: Diagnosis not present

## 2016-06-24 DIAGNOSIS — Z471 Aftercare following joint replacement surgery: Secondary | ICD-10-CM | POA: Diagnosis not present

## 2016-06-24 DIAGNOSIS — M6281 Muscle weakness (generalized): Secondary | ICD-10-CM | POA: Diagnosis not present

## 2016-06-24 DIAGNOSIS — R270 Ataxia, unspecified: Secondary | ICD-10-CM | POA: Diagnosis not present

## 2016-06-25 DIAGNOSIS — R278 Other lack of coordination: Secondary | ICD-10-CM | POA: Diagnosis not present

## 2016-06-25 DIAGNOSIS — R270 Ataxia, unspecified: Secondary | ICD-10-CM | POA: Diagnosis not present

## 2016-06-25 DIAGNOSIS — M6281 Muscle weakness (generalized): Secondary | ICD-10-CM | POA: Diagnosis not present

## 2016-06-25 DIAGNOSIS — Z96652 Presence of left artificial knee joint: Secondary | ICD-10-CM | POA: Diagnosis not present

## 2016-06-25 DIAGNOSIS — Z471 Aftercare following joint replacement surgery: Secondary | ICD-10-CM | POA: Diagnosis not present

## 2016-06-26 DIAGNOSIS — Z96652 Presence of left artificial knee joint: Secondary | ICD-10-CM | POA: Diagnosis not present

## 2016-06-26 DIAGNOSIS — R278 Other lack of coordination: Secondary | ICD-10-CM | POA: Diagnosis not present

## 2016-06-26 DIAGNOSIS — R251 Tremor, unspecified: Secondary | ICD-10-CM | POA: Diagnosis not present

## 2016-06-26 DIAGNOSIS — R270 Ataxia, unspecified: Secondary | ICD-10-CM | POA: Diagnosis not present

## 2016-06-26 DIAGNOSIS — M6281 Muscle weakness (generalized): Secondary | ICD-10-CM | POA: Diagnosis not present

## 2016-06-26 DIAGNOSIS — G301 Alzheimer's disease with late onset: Secondary | ICD-10-CM | POA: Diagnosis not present

## 2016-06-26 DIAGNOSIS — Z471 Aftercare following joint replacement surgery: Secondary | ICD-10-CM | POA: Diagnosis not present

## 2016-06-26 DIAGNOSIS — R69 Illness, unspecified: Secondary | ICD-10-CM | POA: Diagnosis not present

## 2016-06-30 DIAGNOSIS — M25562 Pain in left knee: Secondary | ICD-10-CM | POA: Diagnosis not present

## 2016-06-30 DIAGNOSIS — Z96652 Presence of left artificial knee joint: Secondary | ICD-10-CM | POA: Diagnosis not present

## 2016-06-30 DIAGNOSIS — M25662 Stiffness of left knee, not elsewhere classified: Secondary | ICD-10-CM | POA: Diagnosis not present

## 2016-06-30 DIAGNOSIS — R2689 Other abnormalities of gait and mobility: Secondary | ICD-10-CM | POA: Diagnosis not present

## 2016-06-30 DIAGNOSIS — M6281 Muscle weakness (generalized): Secondary | ICD-10-CM | POA: Diagnosis not present

## 2016-06-30 DIAGNOSIS — R270 Ataxia, unspecified: Secondary | ICD-10-CM | POA: Diagnosis not present

## 2016-06-30 DIAGNOSIS — Z471 Aftercare following joint replacement surgery: Secondary | ICD-10-CM | POA: Diagnosis not present

## 2016-06-30 DIAGNOSIS — R278 Other lack of coordination: Secondary | ICD-10-CM | POA: Diagnosis not present

## 2016-07-03 DIAGNOSIS — M25662 Stiffness of left knee, not elsewhere classified: Secondary | ICD-10-CM | POA: Diagnosis not present

## 2016-07-03 DIAGNOSIS — M6281 Muscle weakness (generalized): Secondary | ICD-10-CM | POA: Diagnosis not present

## 2016-07-03 DIAGNOSIS — Z96652 Presence of left artificial knee joint: Secondary | ICD-10-CM | POA: Diagnosis not present

## 2016-07-03 DIAGNOSIS — M25562 Pain in left knee: Secondary | ICD-10-CM | POA: Diagnosis not present

## 2016-07-03 DIAGNOSIS — R2689 Other abnormalities of gait and mobility: Secondary | ICD-10-CM | POA: Diagnosis not present

## 2016-07-06 DIAGNOSIS — M25562 Pain in left knee: Secondary | ICD-10-CM | POA: Diagnosis not present

## 2016-07-06 DIAGNOSIS — M25662 Stiffness of left knee, not elsewhere classified: Secondary | ICD-10-CM | POA: Diagnosis not present

## 2016-07-06 DIAGNOSIS — R2689 Other abnormalities of gait and mobility: Secondary | ICD-10-CM | POA: Diagnosis not present

## 2016-07-06 DIAGNOSIS — M6281 Muscle weakness (generalized): Secondary | ICD-10-CM | POA: Diagnosis not present

## 2016-07-06 DIAGNOSIS — Z96652 Presence of left artificial knee joint: Secondary | ICD-10-CM | POA: Diagnosis not present

## 2016-07-08 DIAGNOSIS — M25662 Stiffness of left knee, not elsewhere classified: Secondary | ICD-10-CM | POA: Diagnosis not present

## 2016-07-08 DIAGNOSIS — M25562 Pain in left knee: Secondary | ICD-10-CM | POA: Diagnosis not present

## 2016-07-08 DIAGNOSIS — R2689 Other abnormalities of gait and mobility: Secondary | ICD-10-CM | POA: Diagnosis not present

## 2016-07-08 DIAGNOSIS — Z96652 Presence of left artificial knee joint: Secondary | ICD-10-CM | POA: Diagnosis not present

## 2016-07-08 DIAGNOSIS — M6281 Muscle weakness (generalized): Secondary | ICD-10-CM | POA: Diagnosis not present

## 2016-07-08 DIAGNOSIS — Z79899 Other long term (current) drug therapy: Secondary | ICD-10-CM | POA: Diagnosis not present

## 2016-07-08 DIAGNOSIS — R251 Tremor, unspecified: Secondary | ICD-10-CM | POA: Diagnosis not present

## 2016-07-09 DIAGNOSIS — D649 Anemia, unspecified: Secondary | ICD-10-CM | POA: Diagnosis not present

## 2016-07-13 DIAGNOSIS — R2689 Other abnormalities of gait and mobility: Secondary | ICD-10-CM | POA: Diagnosis not present

## 2016-07-13 DIAGNOSIS — M25662 Stiffness of left knee, not elsewhere classified: Secondary | ICD-10-CM | POA: Diagnosis not present

## 2016-07-13 DIAGNOSIS — M6281 Muscle weakness (generalized): Secondary | ICD-10-CM | POA: Diagnosis not present

## 2016-07-13 DIAGNOSIS — M25562 Pain in left knee: Secondary | ICD-10-CM | POA: Diagnosis not present

## 2016-07-13 DIAGNOSIS — Z96652 Presence of left artificial knee joint: Secondary | ICD-10-CM | POA: Diagnosis not present

## 2016-07-16 DIAGNOSIS — R2689 Other abnormalities of gait and mobility: Secondary | ICD-10-CM | POA: Diagnosis not present

## 2016-07-16 DIAGNOSIS — Z96652 Presence of left artificial knee joint: Secondary | ICD-10-CM | POA: Diagnosis not present

## 2016-07-16 DIAGNOSIS — I1310 Hypertensive heart and chronic kidney disease without heart failure, with stage 1 through stage 4 chronic kidney disease, or unspecified chronic kidney disease: Secondary | ICD-10-CM | POA: Diagnosis not present

## 2016-07-16 DIAGNOSIS — E669 Obesity, unspecified: Secondary | ICD-10-CM | POA: Diagnosis not present

## 2016-07-16 DIAGNOSIS — M25662 Stiffness of left knee, not elsewhere classified: Secondary | ICD-10-CM | POA: Diagnosis not present

## 2016-07-16 DIAGNOSIS — E785 Hyperlipidemia, unspecified: Secondary | ICD-10-CM | POA: Diagnosis not present

## 2016-07-16 DIAGNOSIS — M6281 Muscle weakness (generalized): Secondary | ICD-10-CM | POA: Diagnosis not present

## 2016-07-16 DIAGNOSIS — N183 Chronic kidney disease, stage 3 (moderate): Secondary | ICD-10-CM | POA: Diagnosis not present

## 2016-07-16 DIAGNOSIS — M25562 Pain in left knee: Secondary | ICD-10-CM | POA: Diagnosis not present

## 2016-07-16 DIAGNOSIS — Z6830 Body mass index (BMI) 30.0-30.9, adult: Secondary | ICD-10-CM | POA: Diagnosis not present

## 2016-07-20 DIAGNOSIS — Z96652 Presence of left artificial knee joint: Secondary | ICD-10-CM | POA: Diagnosis not present

## 2016-07-20 DIAGNOSIS — D649 Anemia, unspecified: Secondary | ICD-10-CM | POA: Diagnosis not present

## 2016-07-20 DIAGNOSIS — R69 Illness, unspecified: Secondary | ICD-10-CM | POA: Diagnosis not present

## 2016-07-20 DIAGNOSIS — N183 Chronic kidney disease, stage 3 (moderate): Secondary | ICD-10-CM | POA: Diagnosis not present

## 2016-07-20 DIAGNOSIS — R7303 Prediabetes: Secondary | ICD-10-CM | POA: Diagnosis not present

## 2016-07-20 DIAGNOSIS — E7800 Pure hypercholesterolemia, unspecified: Secondary | ICD-10-CM | POA: Diagnosis not present

## 2016-07-20 DIAGNOSIS — E559 Vitamin D deficiency, unspecified: Secondary | ICD-10-CM | POA: Diagnosis not present

## 2016-07-20 DIAGNOSIS — I1310 Hypertensive heart and chronic kidney disease without heart failure, with stage 1 through stage 4 chronic kidney disease, or unspecified chronic kidney disease: Secondary | ICD-10-CM | POA: Diagnosis not present

## 2016-07-20 DIAGNOSIS — M1712 Unilateral primary osteoarthritis, left knee: Secondary | ICD-10-CM | POA: Diagnosis not present

## 2016-07-20 DIAGNOSIS — Z79899 Other long term (current) drug therapy: Secondary | ICD-10-CM | POA: Diagnosis not present

## 2016-07-21 DIAGNOSIS — M6281 Muscle weakness (generalized): Secondary | ICD-10-CM | POA: Diagnosis not present

## 2016-07-21 DIAGNOSIS — M25562 Pain in left knee: Secondary | ICD-10-CM | POA: Diagnosis not present

## 2016-07-21 DIAGNOSIS — M25662 Stiffness of left knee, not elsewhere classified: Secondary | ICD-10-CM | POA: Diagnosis not present

## 2016-07-21 DIAGNOSIS — R2689 Other abnormalities of gait and mobility: Secondary | ICD-10-CM | POA: Diagnosis not present

## 2016-07-21 DIAGNOSIS — Z96652 Presence of left artificial knee joint: Secondary | ICD-10-CM | POA: Diagnosis not present

## 2016-07-24 DIAGNOSIS — Z96652 Presence of left artificial knee joint: Secondary | ICD-10-CM | POA: Diagnosis not present

## 2016-07-24 DIAGNOSIS — M6281 Muscle weakness (generalized): Secondary | ICD-10-CM | POA: Diagnosis not present

## 2016-07-24 DIAGNOSIS — M25662 Stiffness of left knee, not elsewhere classified: Secondary | ICD-10-CM | POA: Diagnosis not present

## 2016-07-24 DIAGNOSIS — M25562 Pain in left knee: Secondary | ICD-10-CM | POA: Diagnosis not present

## 2016-07-24 DIAGNOSIS — R2689 Other abnormalities of gait and mobility: Secondary | ICD-10-CM | POA: Diagnosis not present

## 2016-07-28 DIAGNOSIS — R2689 Other abnormalities of gait and mobility: Secondary | ICD-10-CM | POA: Diagnosis not present

## 2016-07-28 DIAGNOSIS — Z96652 Presence of left artificial knee joint: Secondary | ICD-10-CM | POA: Diagnosis not present

## 2016-07-28 DIAGNOSIS — M6281 Muscle weakness (generalized): Secondary | ICD-10-CM | POA: Diagnosis not present

## 2016-07-28 DIAGNOSIS — M25662 Stiffness of left knee, not elsewhere classified: Secondary | ICD-10-CM | POA: Diagnosis not present

## 2016-07-28 DIAGNOSIS — M25562 Pain in left knee: Secondary | ICD-10-CM | POA: Diagnosis not present

## 2016-07-30 DIAGNOSIS — M25562 Pain in left knee: Secondary | ICD-10-CM | POA: Diagnosis not present

## 2016-07-30 DIAGNOSIS — M25662 Stiffness of left knee, not elsewhere classified: Secondary | ICD-10-CM | POA: Diagnosis not present

## 2016-07-30 DIAGNOSIS — Z96652 Presence of left artificial knee joint: Secondary | ICD-10-CM | POA: Diagnosis not present

## 2016-07-30 DIAGNOSIS — R2689 Other abnormalities of gait and mobility: Secondary | ICD-10-CM | POA: Diagnosis not present

## 2016-07-30 DIAGNOSIS — M6281 Muscle weakness (generalized): Secondary | ICD-10-CM | POA: Diagnosis not present

## 2016-08-03 DIAGNOSIS — M6281 Muscle weakness (generalized): Secondary | ICD-10-CM | POA: Diagnosis not present

## 2016-08-03 DIAGNOSIS — M25662 Stiffness of left knee, not elsewhere classified: Secondary | ICD-10-CM | POA: Diagnosis not present

## 2016-08-03 DIAGNOSIS — Z96652 Presence of left artificial knee joint: Secondary | ICD-10-CM | POA: Diagnosis not present

## 2016-08-03 DIAGNOSIS — R2689 Other abnormalities of gait and mobility: Secondary | ICD-10-CM | POA: Diagnosis not present

## 2016-08-03 DIAGNOSIS — M25562 Pain in left knee: Secondary | ICD-10-CM | POA: Diagnosis not present

## 2016-08-06 DIAGNOSIS — M6281 Muscle weakness (generalized): Secondary | ICD-10-CM | POA: Diagnosis not present

## 2016-08-06 DIAGNOSIS — R2689 Other abnormalities of gait and mobility: Secondary | ICD-10-CM | POA: Diagnosis not present

## 2016-08-06 DIAGNOSIS — Z96652 Presence of left artificial knee joint: Secondary | ICD-10-CM | POA: Diagnosis not present

## 2016-08-06 DIAGNOSIS — M25662 Stiffness of left knee, not elsewhere classified: Secondary | ICD-10-CM | POA: Diagnosis not present

## 2016-08-06 DIAGNOSIS — M25562 Pain in left knee: Secondary | ICD-10-CM | POA: Diagnosis not present

## 2016-08-10 DIAGNOSIS — M6281 Muscle weakness (generalized): Secondary | ICD-10-CM | POA: Diagnosis not present

## 2016-08-10 DIAGNOSIS — R2689 Other abnormalities of gait and mobility: Secondary | ICD-10-CM | POA: Diagnosis not present

## 2016-08-10 DIAGNOSIS — M25662 Stiffness of left knee, not elsewhere classified: Secondary | ICD-10-CM | POA: Diagnosis not present

## 2016-08-10 DIAGNOSIS — Z96652 Presence of left artificial knee joint: Secondary | ICD-10-CM | POA: Diagnosis not present

## 2016-08-10 DIAGNOSIS — M25562 Pain in left knee: Secondary | ICD-10-CM | POA: Diagnosis not present

## 2016-08-12 DIAGNOSIS — M25662 Stiffness of left knee, not elsewhere classified: Secondary | ICD-10-CM | POA: Diagnosis not present

## 2016-08-12 DIAGNOSIS — M6281 Muscle weakness (generalized): Secondary | ICD-10-CM | POA: Diagnosis not present

## 2016-08-12 DIAGNOSIS — Z96652 Presence of left artificial knee joint: Secondary | ICD-10-CM | POA: Diagnosis not present

## 2016-08-12 DIAGNOSIS — R2689 Other abnormalities of gait and mobility: Secondary | ICD-10-CM | POA: Diagnosis not present

## 2016-08-12 DIAGNOSIS — M25562 Pain in left knee: Secondary | ICD-10-CM | POA: Diagnosis not present

## 2016-09-08 DIAGNOSIS — G629 Polyneuropathy, unspecified: Secondary | ICD-10-CM | POA: Diagnosis not present

## 2016-09-08 DIAGNOSIS — G4700 Insomnia, unspecified: Secondary | ICD-10-CM | POA: Diagnosis not present

## 2016-09-08 DIAGNOSIS — E669 Obesity, unspecified: Secondary | ICD-10-CM | POA: Diagnosis not present

## 2016-09-08 DIAGNOSIS — E7801 Familial hypercholesterolemia: Secondary | ICD-10-CM | POA: Diagnosis not present

## 2016-09-08 DIAGNOSIS — I129 Hypertensive chronic kidney disease with stage 1 through stage 4 chronic kidney disease, or unspecified chronic kidney disease: Secondary | ICD-10-CM | POA: Diagnosis not present

## 2016-09-08 DIAGNOSIS — R69 Illness, unspecified: Secondary | ICD-10-CM | POA: Diagnosis not present

## 2016-09-08 DIAGNOSIS — Z0000 Encounter for general adult medical examination without abnormal findings: Secondary | ICD-10-CM | POA: Diagnosis not present

## 2016-09-08 DIAGNOSIS — G250 Essential tremor: Secondary | ICD-10-CM | POA: Diagnosis not present

## 2016-09-11 DIAGNOSIS — Z1231 Encounter for screening mammogram for malignant neoplasm of breast: Secondary | ICD-10-CM | POA: Diagnosis not present

## 2016-09-30 DIAGNOSIS — N182 Chronic kidney disease, stage 2 (mild): Secondary | ICD-10-CM | POA: Diagnosis not present

## 2016-09-30 DIAGNOSIS — I129 Hypertensive chronic kidney disease with stage 1 through stage 4 chronic kidney disease, or unspecified chronic kidney disease: Secondary | ICD-10-CM | POA: Diagnosis not present

## 2016-09-30 DIAGNOSIS — M9080 Osteopathy in diseases classified elsewhere, unspecified site: Secondary | ICD-10-CM | POA: Diagnosis not present

## 2016-09-30 DIAGNOSIS — E559 Vitamin D deficiency, unspecified: Secondary | ICD-10-CM | POA: Diagnosis not present

## 2016-09-30 DIAGNOSIS — E889 Metabolic disorder, unspecified: Secondary | ICD-10-CM | POA: Diagnosis not present

## 2016-10-09 DIAGNOSIS — M9080 Osteopathy in diseases classified elsewhere, unspecified site: Secondary | ICD-10-CM | POA: Diagnosis not present

## 2016-10-09 DIAGNOSIS — G8929 Other chronic pain: Secondary | ICD-10-CM | POA: Diagnosis not present

## 2016-10-09 DIAGNOSIS — M545 Low back pain: Secondary | ICD-10-CM | POA: Diagnosis not present

## 2016-10-09 DIAGNOSIS — N182 Chronic kidney disease, stage 2 (mild): Secondary | ICD-10-CM | POA: Diagnosis not present

## 2016-10-09 DIAGNOSIS — D631 Anemia in chronic kidney disease: Secondary | ICD-10-CM | POA: Insufficient documentation

## 2016-10-09 DIAGNOSIS — M10371 Gout due to renal impairment, right ankle and foot: Secondary | ICD-10-CM | POA: Diagnosis not present

## 2016-10-09 DIAGNOSIS — E559 Vitamin D deficiency, unspecified: Secondary | ICD-10-CM | POA: Diagnosis not present

## 2016-10-09 DIAGNOSIS — I129 Hypertensive chronic kidney disease with stage 1 through stage 4 chronic kidney disease, or unspecified chronic kidney disease: Secondary | ICD-10-CM | POA: Diagnosis not present

## 2016-10-09 DIAGNOSIS — E889 Metabolic disorder, unspecified: Secondary | ICD-10-CM | POA: Diagnosis not present

## 2016-10-09 HISTORY — DX: Chronic kidney disease, stage 2 (mild): D63.1

## 2016-10-09 HISTORY — DX: Chronic kidney disease, stage 2 (mild): N18.2

## 2016-11-09 DIAGNOSIS — I1310 Hypertensive heart and chronic kidney disease without heart failure, with stage 1 through stage 4 chronic kidney disease, or unspecified chronic kidney disease: Secondary | ICD-10-CM | POA: Diagnosis not present

## 2016-11-09 DIAGNOSIS — R7303 Prediabetes: Secondary | ICD-10-CM | POA: Diagnosis not present

## 2016-11-09 DIAGNOSIS — Z23 Encounter for immunization: Secondary | ICD-10-CM | POA: Diagnosis not present

## 2016-11-09 DIAGNOSIS — N183 Chronic kidney disease, stage 3 (moderate): Secondary | ICD-10-CM | POA: Diagnosis not present

## 2016-11-09 DIAGNOSIS — G250 Essential tremor: Secondary | ICD-10-CM | POA: Diagnosis not present

## 2016-11-09 DIAGNOSIS — F0280 Dementia in other diseases classified elsewhere without behavioral disturbance: Secondary | ICD-10-CM | POA: Diagnosis not present

## 2016-11-09 DIAGNOSIS — M5440 Lumbago with sciatica, unspecified side: Secondary | ICD-10-CM | POA: Diagnosis not present

## 2016-11-09 DIAGNOSIS — R69 Illness, unspecified: Secondary | ICD-10-CM | POA: Diagnosis not present

## 2016-11-09 DIAGNOSIS — M2550 Pain in unspecified joint: Secondary | ICD-10-CM | POA: Diagnosis not present

## 2016-11-09 DIAGNOSIS — G309 Alzheimer's disease, unspecified: Secondary | ICD-10-CM | POA: Diagnosis not present

## 2016-11-10 DIAGNOSIS — M545 Low back pain: Secondary | ICD-10-CM | POA: Diagnosis not present

## 2016-11-10 DIAGNOSIS — G301 Alzheimer's disease with late onset: Secondary | ICD-10-CM | POA: Diagnosis not present

## 2016-11-10 DIAGNOSIS — G252 Other specified forms of tremor: Secondary | ICD-10-CM | POA: Diagnosis not present

## 2016-11-10 DIAGNOSIS — M47816 Spondylosis without myelopathy or radiculopathy, lumbar region: Secondary | ICD-10-CM | POA: Diagnosis not present

## 2016-11-10 DIAGNOSIS — R69 Illness, unspecified: Secondary | ICD-10-CM | POA: Diagnosis not present

## 2016-12-08 DIAGNOSIS — M47816 Spondylosis without myelopathy or radiculopathy, lumbar region: Secondary | ICD-10-CM | POA: Diagnosis not present

## 2016-12-09 DIAGNOSIS — Z1331 Encounter for screening for depression: Secondary | ICD-10-CM | POA: Diagnosis not present

## 2016-12-09 DIAGNOSIS — Z1389 Encounter for screening for other disorder: Secondary | ICD-10-CM | POA: Diagnosis not present

## 2016-12-09 DIAGNOSIS — E559 Vitamin D deficiency, unspecified: Secondary | ICD-10-CM | POA: Diagnosis not present

## 2016-12-09 DIAGNOSIS — Z0000 Encounter for general adult medical examination without abnormal findings: Secondary | ICD-10-CM | POA: Diagnosis not present

## 2016-12-09 DIAGNOSIS — Z1211 Encounter for screening for malignant neoplasm of colon: Secondary | ICD-10-CM | POA: Diagnosis not present

## 2016-12-09 DIAGNOSIS — I10 Essential (primary) hypertension: Secondary | ICD-10-CM | POA: Diagnosis not present

## 2016-12-09 DIAGNOSIS — Z9181 History of falling: Secondary | ICD-10-CM | POA: Diagnosis not present

## 2016-12-09 DIAGNOSIS — Z79899 Other long term (current) drug therapy: Secondary | ICD-10-CM | POA: Diagnosis not present

## 2016-12-09 DIAGNOSIS — E785 Hyperlipidemia, unspecified: Secondary | ICD-10-CM | POA: Diagnosis not present

## 2016-12-24 DIAGNOSIS — N183 Chronic kidney disease, stage 3 (moderate): Secondary | ICD-10-CM | POA: Diagnosis not present

## 2016-12-24 DIAGNOSIS — R69 Illness, unspecified: Secondary | ICD-10-CM | POA: Diagnosis not present

## 2016-12-24 DIAGNOSIS — M5440 Lumbago with sciatica, unspecified side: Secondary | ICD-10-CM | POA: Diagnosis not present

## 2016-12-24 DIAGNOSIS — R7303 Prediabetes: Secondary | ICD-10-CM | POA: Diagnosis not present

## 2016-12-24 DIAGNOSIS — M2550 Pain in unspecified joint: Secondary | ICD-10-CM | POA: Diagnosis not present

## 2016-12-24 DIAGNOSIS — G250 Essential tremor: Secondary | ICD-10-CM | POA: Diagnosis not present

## 2016-12-24 DIAGNOSIS — I1310 Hypertensive heart and chronic kidney disease without heart failure, with stage 1 through stage 4 chronic kidney disease, or unspecified chronic kidney disease: Secondary | ICD-10-CM | POA: Diagnosis not present

## 2016-12-24 DIAGNOSIS — E559 Vitamin D deficiency, unspecified: Secondary | ICD-10-CM | POA: Diagnosis not present

## 2016-12-24 DIAGNOSIS — G309 Alzheimer's disease, unspecified: Secondary | ICD-10-CM | POA: Diagnosis not present

## 2016-12-24 DIAGNOSIS — F0280 Dementia in other diseases classified elsewhere without behavioral disturbance: Secondary | ICD-10-CM | POA: Diagnosis not present

## 2017-01-06 DIAGNOSIS — R42 Dizziness and giddiness: Secondary | ICD-10-CM | POA: Diagnosis not present

## 2017-01-06 DIAGNOSIS — R269 Unspecified abnormalities of gait and mobility: Secondary | ICD-10-CM | POA: Diagnosis not present

## 2017-01-07 DIAGNOSIS — M47816 Spondylosis without myelopathy or radiculopathy, lumbar region: Secondary | ICD-10-CM | POA: Diagnosis not present

## 2017-01-14 DIAGNOSIS — M6281 Muscle weakness (generalized): Secondary | ICD-10-CM | POA: Diagnosis not present

## 2017-01-14 DIAGNOSIS — R2689 Other abnormalities of gait and mobility: Secondary | ICD-10-CM | POA: Diagnosis not present

## 2017-01-18 DIAGNOSIS — R2689 Other abnormalities of gait and mobility: Secondary | ICD-10-CM | POA: Diagnosis not present

## 2017-01-18 DIAGNOSIS — M6281 Muscle weakness (generalized): Secondary | ICD-10-CM | POA: Diagnosis not present

## 2017-01-20 DIAGNOSIS — M6281 Muscle weakness (generalized): Secondary | ICD-10-CM | POA: Diagnosis not present

## 2017-01-20 DIAGNOSIS — R2689 Other abnormalities of gait and mobility: Secondary | ICD-10-CM | POA: Diagnosis not present

## 2017-01-25 DIAGNOSIS — M6281 Muscle weakness (generalized): Secondary | ICD-10-CM | POA: Diagnosis not present

## 2017-01-25 DIAGNOSIS — R2689 Other abnormalities of gait and mobility: Secondary | ICD-10-CM | POA: Diagnosis not present

## 2017-01-27 DIAGNOSIS — M6281 Muscle weakness (generalized): Secondary | ICD-10-CM | POA: Diagnosis not present

## 2017-01-27 DIAGNOSIS — R2689 Other abnormalities of gait and mobility: Secondary | ICD-10-CM | POA: Diagnosis not present

## 2017-02-01 DIAGNOSIS — R2689 Other abnormalities of gait and mobility: Secondary | ICD-10-CM | POA: Diagnosis not present

## 2017-02-01 DIAGNOSIS — M6281 Muscle weakness (generalized): Secondary | ICD-10-CM | POA: Diagnosis not present

## 2017-02-03 DIAGNOSIS — R2689 Other abnormalities of gait and mobility: Secondary | ICD-10-CM | POA: Diagnosis not present

## 2017-02-03 DIAGNOSIS — M6281 Muscle weakness (generalized): Secondary | ICD-10-CM | POA: Diagnosis not present

## 2017-02-04 DIAGNOSIS — J020 Streptococcal pharyngitis: Secondary | ICD-10-CM | POA: Diagnosis not present

## 2017-02-08 DIAGNOSIS — R2689 Other abnormalities of gait and mobility: Secondary | ICD-10-CM | POA: Diagnosis not present

## 2017-02-08 DIAGNOSIS — M6281 Muscle weakness (generalized): Secondary | ICD-10-CM | POA: Diagnosis not present

## 2017-02-10 DIAGNOSIS — R2689 Other abnormalities of gait and mobility: Secondary | ICD-10-CM | POA: Diagnosis not present

## 2017-02-10 DIAGNOSIS — M6281 Muscle weakness (generalized): Secondary | ICD-10-CM | POA: Diagnosis not present

## 2017-02-16 DIAGNOSIS — M6281 Muscle weakness (generalized): Secondary | ICD-10-CM | POA: Diagnosis not present

## 2017-02-16 DIAGNOSIS — R2689 Other abnormalities of gait and mobility: Secondary | ICD-10-CM | POA: Diagnosis not present

## 2017-02-18 DIAGNOSIS — R2689 Other abnormalities of gait and mobility: Secondary | ICD-10-CM | POA: Diagnosis not present

## 2017-02-18 DIAGNOSIS — M6281 Muscle weakness (generalized): Secondary | ICD-10-CM | POA: Diagnosis not present

## 2017-04-07 DIAGNOSIS — E889 Metabolic disorder, unspecified: Secondary | ICD-10-CM | POA: Diagnosis not present

## 2017-04-07 DIAGNOSIS — I129 Hypertensive chronic kidney disease with stage 1 through stage 4 chronic kidney disease, or unspecified chronic kidney disease: Secondary | ICD-10-CM | POA: Diagnosis not present

## 2017-04-07 DIAGNOSIS — M9080 Osteopathy in diseases classified elsewhere, unspecified site: Secondary | ICD-10-CM | POA: Diagnosis not present

## 2017-04-07 DIAGNOSIS — D631 Anemia in chronic kidney disease: Secondary | ICD-10-CM | POA: Diagnosis not present

## 2017-04-07 DIAGNOSIS — N182 Chronic kidney disease, stage 2 (mild): Secondary | ICD-10-CM | POA: Diagnosis not present

## 2017-04-07 DIAGNOSIS — E559 Vitamin D deficiency, unspecified: Secondary | ICD-10-CM | POA: Diagnosis not present

## 2017-04-08 DIAGNOSIS — E559 Vitamin D deficiency, unspecified: Secondary | ICD-10-CM | POA: Diagnosis not present

## 2017-04-08 DIAGNOSIS — I129 Hypertensive chronic kidney disease with stage 1 through stage 4 chronic kidney disease, or unspecified chronic kidney disease: Secondary | ICD-10-CM | POA: Diagnosis not present

## 2017-04-08 DIAGNOSIS — M9080 Osteopathy in diseases classified elsewhere, unspecified site: Secondary | ICD-10-CM | POA: Diagnosis not present

## 2017-04-08 DIAGNOSIS — N182 Chronic kidney disease, stage 2 (mild): Secondary | ICD-10-CM | POA: Diagnosis not present

## 2017-04-08 DIAGNOSIS — E889 Metabolic disorder, unspecified: Secondary | ICD-10-CM | POA: Diagnosis not present

## 2017-04-08 DIAGNOSIS — D631 Anemia in chronic kidney disease: Secondary | ICD-10-CM | POA: Diagnosis not present

## 2017-04-21 DIAGNOSIS — M25562 Pain in left knee: Secondary | ICD-10-CM | POA: Diagnosis not present

## 2017-04-21 DIAGNOSIS — S59901A Unspecified injury of right elbow, initial encounter: Secondary | ICD-10-CM | POA: Diagnosis not present

## 2017-04-21 DIAGNOSIS — S4992XA Unspecified injury of left shoulder and upper arm, initial encounter: Secondary | ICD-10-CM | POA: Diagnosis not present

## 2017-04-21 DIAGNOSIS — S4991XA Unspecified injury of right shoulder and upper arm, initial encounter: Secondary | ICD-10-CM | POA: Diagnosis not present

## 2017-04-21 DIAGNOSIS — S8992XA Unspecified injury of left lower leg, initial encounter: Secondary | ICD-10-CM | POA: Diagnosis not present

## 2017-04-21 DIAGNOSIS — M546 Pain in thoracic spine: Secondary | ICD-10-CM | POA: Diagnosis not present

## 2017-04-21 DIAGNOSIS — M25521 Pain in right elbow: Secondary | ICD-10-CM | POA: Diagnosis not present

## 2017-04-21 DIAGNOSIS — M25511 Pain in right shoulder: Secondary | ICD-10-CM | POA: Diagnosis not present

## 2017-04-21 DIAGNOSIS — M25512 Pain in left shoulder: Secondary | ICD-10-CM | POA: Diagnosis not present

## 2017-04-21 DIAGNOSIS — S299XXA Unspecified injury of thorax, initial encounter: Secondary | ICD-10-CM | POA: Diagnosis not present

## 2017-04-22 DIAGNOSIS — M546 Pain in thoracic spine: Secondary | ICD-10-CM | POA: Diagnosis not present

## 2017-04-22 DIAGNOSIS — M25519 Pain in unspecified shoulder: Secondary | ICD-10-CM | POA: Diagnosis not present

## 2017-04-22 DIAGNOSIS — M25521 Pain in right elbow: Secondary | ICD-10-CM | POA: Diagnosis not present

## 2017-04-22 DIAGNOSIS — M25562 Pain in left knee: Secondary | ICD-10-CM | POA: Diagnosis not present

## 2017-05-14 DIAGNOSIS — M25551 Pain in right hip: Secondary | ICD-10-CM | POA: Diagnosis not present

## 2017-05-14 DIAGNOSIS — Z6831 Body mass index (BMI) 31.0-31.9, adult: Secondary | ICD-10-CM | POA: Diagnosis not present

## 2017-05-14 DIAGNOSIS — M25572 Pain in left ankle and joints of left foot: Secondary | ICD-10-CM | POA: Diagnosis not present

## 2017-05-14 DIAGNOSIS — M79662 Pain in left lower leg: Secondary | ICD-10-CM | POA: Diagnosis not present

## 2017-05-14 DIAGNOSIS — S8992XA Unspecified injury of left lower leg, initial encounter: Secondary | ICD-10-CM | POA: Diagnosis not present

## 2017-05-14 DIAGNOSIS — X58XXXA Exposure to other specified factors, initial encounter: Secondary | ICD-10-CM | POA: Diagnosis not present

## 2017-05-14 DIAGNOSIS — S99912A Unspecified injury of left ankle, initial encounter: Secondary | ICD-10-CM | POA: Diagnosis not present

## 2017-05-14 DIAGNOSIS — M1612 Unilateral primary osteoarthritis, left hip: Secondary | ICD-10-CM | POA: Diagnosis not present

## 2017-06-08 DIAGNOSIS — M2550 Pain in unspecified joint: Secondary | ICD-10-CM | POA: Diagnosis not present

## 2017-06-08 DIAGNOSIS — Z6831 Body mass index (BMI) 31.0-31.9, adult: Secondary | ICD-10-CM | POA: Diagnosis not present

## 2017-06-08 DIAGNOSIS — E7800 Pure hypercholesterolemia, unspecified: Secondary | ICD-10-CM | POA: Diagnosis not present

## 2017-06-08 DIAGNOSIS — R7303 Prediabetes: Secondary | ICD-10-CM | POA: Diagnosis not present

## 2017-06-08 DIAGNOSIS — Z79899 Other long term (current) drug therapy: Secondary | ICD-10-CM | POA: Diagnosis not present

## 2017-06-08 DIAGNOSIS — E559 Vitamin D deficiency, unspecified: Secondary | ICD-10-CM | POA: Diagnosis not present

## 2017-06-25 DIAGNOSIS — G5601 Carpal tunnel syndrome, right upper limb: Secondary | ICD-10-CM | POA: Insufficient documentation

## 2017-06-25 DIAGNOSIS — M47816 Spondylosis without myelopathy or radiculopathy, lumbar region: Secondary | ICD-10-CM | POA: Diagnosis not present

## 2017-06-25 DIAGNOSIS — R69 Illness, unspecified: Secondary | ICD-10-CM | POA: Diagnosis not present

## 2017-06-25 DIAGNOSIS — G301 Alzheimer's disease with late onset: Secondary | ICD-10-CM | POA: Diagnosis not present

## 2017-06-25 HISTORY — DX: Carpal tunnel syndrome, right upper limb: G56.01

## 2017-07-07 DIAGNOSIS — Z96652 Presence of left artificial knee joint: Secondary | ICD-10-CM | POA: Diagnosis not present

## 2017-07-07 DIAGNOSIS — T8484XD Pain due to internal orthopedic prosthetic devices, implants and grafts, subsequent encounter: Secondary | ICD-10-CM | POA: Diagnosis not present

## 2017-07-07 DIAGNOSIS — M25562 Pain in left knee: Secondary | ICD-10-CM | POA: Diagnosis not present

## 2017-07-09 DIAGNOSIS — T8484XD Pain due to internal orthopedic prosthetic devices, implants and grafts, subsequent encounter: Secondary | ICD-10-CM

## 2017-07-09 DIAGNOSIS — Z96652 Presence of left artificial knee joint: Secondary | ICD-10-CM

## 2017-07-09 HISTORY — DX: Pain due to internal orthopedic prosthetic devices, implants and grafts, subsequent encounter: T84.84XD

## 2017-07-09 HISTORY — DX: Presence of left artificial knee joint: Z96.652

## 2017-07-29 DIAGNOSIS — M549 Dorsalgia, unspecified: Secondary | ICD-10-CM | POA: Diagnosis not present

## 2017-07-29 DIAGNOSIS — Z6832 Body mass index (BMI) 32.0-32.9, adult: Secondary | ICD-10-CM | POA: Diagnosis not present

## 2017-09-08 DIAGNOSIS — Z136 Encounter for screening for cardiovascular disorders: Secondary | ICD-10-CM | POA: Diagnosis not present

## 2017-09-08 DIAGNOSIS — N189 Chronic kidney disease, unspecified: Secondary | ICD-10-CM | POA: Diagnosis not present

## 2017-09-08 DIAGNOSIS — Z9181 History of falling: Secondary | ICD-10-CM | POA: Diagnosis not present

## 2017-09-08 DIAGNOSIS — Z6831 Body mass index (BMI) 31.0-31.9, adult: Secondary | ICD-10-CM | POA: Diagnosis not present

## 2017-09-08 DIAGNOSIS — R7303 Prediabetes: Secondary | ICD-10-CM | POA: Diagnosis not present

## 2017-09-08 DIAGNOSIS — E785 Hyperlipidemia, unspecified: Secondary | ICD-10-CM | POA: Diagnosis not present

## 2017-09-08 DIAGNOSIS — Z1339 Encounter for screening examination for other mental health and behavioral disorders: Secondary | ICD-10-CM | POA: Diagnosis not present

## 2017-09-08 DIAGNOSIS — Z0000 Encounter for general adult medical examination without abnormal findings: Secondary | ICD-10-CM | POA: Diagnosis not present

## 2017-09-08 DIAGNOSIS — Z1331 Encounter for screening for depression: Secondary | ICD-10-CM | POA: Diagnosis not present

## 2017-09-08 DIAGNOSIS — E7800 Pure hypercholesterolemia, unspecified: Secondary | ICD-10-CM | POA: Diagnosis not present

## 2017-09-08 DIAGNOSIS — R0789 Other chest pain: Secondary | ICD-10-CM | POA: Diagnosis not present

## 2017-09-08 DIAGNOSIS — E669 Obesity, unspecified: Secondary | ICD-10-CM | POA: Diagnosis not present

## 2017-09-10 DIAGNOSIS — R9431 Abnormal electrocardiogram [ECG] [EKG]: Secondary | ICD-10-CM | POA: Diagnosis not present

## 2017-09-10 DIAGNOSIS — R079 Chest pain, unspecified: Secondary | ICD-10-CM | POA: Diagnosis not present

## 2017-09-14 DIAGNOSIS — R0602 Shortness of breath: Secondary | ICD-10-CM | POA: Diagnosis not present

## 2017-09-14 DIAGNOSIS — R9431 Abnormal electrocardiogram [ECG] [EKG]: Secondary | ICD-10-CM | POA: Diagnosis not present

## 2017-09-14 DIAGNOSIS — R079 Chest pain, unspecified: Secondary | ICD-10-CM | POA: Diagnosis not present

## 2017-09-28 DIAGNOSIS — Z1231 Encounter for screening mammogram for malignant neoplasm of breast: Secondary | ICD-10-CM | POA: Diagnosis not present

## 2017-10-11 DIAGNOSIS — H52223 Regular astigmatism, bilateral: Secondary | ICD-10-CM | POA: Diagnosis not present

## 2017-10-11 DIAGNOSIS — H47093 Other disorders of optic nerve, not elsewhere classified, bilateral: Secondary | ICD-10-CM | POA: Diagnosis not present

## 2017-10-11 DIAGNOSIS — I10 Essential (primary) hypertension: Secondary | ICD-10-CM | POA: Diagnosis not present

## 2017-10-11 DIAGNOSIS — H11152 Pinguecula, left eye: Secondary | ICD-10-CM | POA: Diagnosis not present

## 2017-10-11 DIAGNOSIS — H5213 Myopia, bilateral: Secondary | ICD-10-CM | POA: Diagnosis not present

## 2017-10-11 DIAGNOSIS — H524 Presbyopia: Secondary | ICD-10-CM | POA: Diagnosis not present

## 2017-10-11 DIAGNOSIS — H25813 Combined forms of age-related cataract, bilateral: Secondary | ICD-10-CM | POA: Diagnosis not present

## 2017-10-25 DIAGNOSIS — M19011 Primary osteoarthritis, right shoulder: Secondary | ICD-10-CM | POA: Diagnosis not present

## 2017-10-25 DIAGNOSIS — M25511 Pain in right shoulder: Secondary | ICD-10-CM | POA: Diagnosis not present

## 2017-10-27 DIAGNOSIS — M5090 Cervical disc disorder, unspecified, unspecified cervical region: Secondary | ICD-10-CM | POA: Diagnosis not present

## 2017-10-27 DIAGNOSIS — M5412 Radiculopathy, cervical region: Secondary | ICD-10-CM | POA: Diagnosis not present

## 2017-10-27 DIAGNOSIS — M47812 Spondylosis without myelopathy or radiculopathy, cervical region: Secondary | ICD-10-CM | POA: Diagnosis not present

## 2017-10-28 DIAGNOSIS — M542 Cervicalgia: Secondary | ICD-10-CM | POA: Diagnosis not present

## 2017-10-28 DIAGNOSIS — M5412 Radiculopathy, cervical region: Secondary | ICD-10-CM | POA: Diagnosis not present

## 2017-10-28 DIAGNOSIS — M25511 Pain in right shoulder: Secondary | ICD-10-CM | POA: Diagnosis not present

## 2017-10-30 DIAGNOSIS — M47812 Spondylosis without myelopathy or radiculopathy, cervical region: Secondary | ICD-10-CM | POA: Diagnosis not present

## 2017-10-30 DIAGNOSIS — M4722 Other spondylosis with radiculopathy, cervical region: Secondary | ICD-10-CM | POA: Diagnosis not present

## 2017-11-03 DIAGNOSIS — M5412 Radiculopathy, cervical region: Secondary | ICD-10-CM | POA: Diagnosis not present

## 2017-11-03 DIAGNOSIS — M25511 Pain in right shoulder: Secondary | ICD-10-CM | POA: Diagnosis not present

## 2017-11-03 DIAGNOSIS — M542 Cervicalgia: Secondary | ICD-10-CM | POA: Diagnosis not present

## 2017-11-04 DIAGNOSIS — M47812 Spondylosis without myelopathy or radiculopathy, cervical region: Secondary | ICD-10-CM | POA: Diagnosis not present

## 2017-11-10 DIAGNOSIS — M542 Cervicalgia: Secondary | ICD-10-CM | POA: Diagnosis not present

## 2017-11-10 DIAGNOSIS — M5412 Radiculopathy, cervical region: Secondary | ICD-10-CM | POA: Diagnosis not present

## 2017-11-10 DIAGNOSIS — M25511 Pain in right shoulder: Secondary | ICD-10-CM | POA: Diagnosis not present

## 2017-11-17 DIAGNOSIS — M25511 Pain in right shoulder: Secondary | ICD-10-CM | POA: Diagnosis not present

## 2017-11-17 DIAGNOSIS — M5412 Radiculopathy, cervical region: Secondary | ICD-10-CM | POA: Diagnosis not present

## 2017-11-17 DIAGNOSIS — M542 Cervicalgia: Secondary | ICD-10-CM | POA: Diagnosis not present

## 2017-11-18 DIAGNOSIS — R252 Cramp and spasm: Secondary | ICD-10-CM | POA: Diagnosis not present

## 2017-11-18 DIAGNOSIS — Z6831 Body mass index (BMI) 31.0-31.9, adult: Secondary | ICD-10-CM | POA: Diagnosis not present

## 2017-11-18 DIAGNOSIS — I10 Essential (primary) hypertension: Secondary | ICD-10-CM | POA: Diagnosis not present

## 2017-11-24 DIAGNOSIS — M542 Cervicalgia: Secondary | ICD-10-CM | POA: Diagnosis not present

## 2017-11-24 DIAGNOSIS — M5412 Radiculopathy, cervical region: Secondary | ICD-10-CM | POA: Diagnosis not present

## 2017-11-24 DIAGNOSIS — M25511 Pain in right shoulder: Secondary | ICD-10-CM | POA: Diagnosis not present

## 2017-11-24 DIAGNOSIS — M19011 Primary osteoarthritis, right shoulder: Secondary | ICD-10-CM | POA: Diagnosis not present

## 2017-11-30 DIAGNOSIS — M47816 Spondylosis without myelopathy or radiculopathy, lumbar region: Secondary | ICD-10-CM | POA: Diagnosis not present

## 2017-12-06 DIAGNOSIS — M542 Cervicalgia: Secondary | ICD-10-CM | POA: Diagnosis not present

## 2017-12-06 DIAGNOSIS — M5412 Radiculopathy, cervical region: Secondary | ICD-10-CM | POA: Diagnosis not present

## 2017-12-06 DIAGNOSIS — M25511 Pain in right shoulder: Secondary | ICD-10-CM | POA: Diagnosis not present

## 2017-12-13 DIAGNOSIS — M542 Cervicalgia: Secondary | ICD-10-CM | POA: Diagnosis not present

## 2017-12-13 DIAGNOSIS — M5412 Radiculopathy, cervical region: Secondary | ICD-10-CM | POA: Diagnosis not present

## 2017-12-13 DIAGNOSIS — M25511 Pain in right shoulder: Secondary | ICD-10-CM | POA: Diagnosis not present

## 2017-12-21 DIAGNOSIS — E559 Vitamin D deficiency, unspecified: Secondary | ICD-10-CM | POA: Diagnosis not present

## 2017-12-21 DIAGNOSIS — E7800 Pure hypercholesterolemia, unspecified: Secondary | ICD-10-CM | POA: Diagnosis not present

## 2017-12-21 DIAGNOSIS — K219 Gastro-esophageal reflux disease without esophagitis: Secondary | ICD-10-CM | POA: Diagnosis not present

## 2017-12-21 DIAGNOSIS — I129 Hypertensive chronic kidney disease with stage 1 through stage 4 chronic kidney disease, or unspecified chronic kidney disease: Secondary | ICD-10-CM | POA: Diagnosis not present

## 2017-12-21 DIAGNOSIS — Z79899 Other long term (current) drug therapy: Secondary | ICD-10-CM | POA: Diagnosis not present

## 2017-12-21 DIAGNOSIS — R7303 Prediabetes: Secondary | ICD-10-CM | POA: Diagnosis not present

## 2017-12-21 DIAGNOSIS — R350 Frequency of micturition: Secondary | ICD-10-CM | POA: Diagnosis not present

## 2017-12-22 DIAGNOSIS — M47812 Spondylosis without myelopathy or radiculopathy, cervical region: Secondary | ICD-10-CM | POA: Diagnosis not present

## 2017-12-22 DIAGNOSIS — M542 Cervicalgia: Secondary | ICD-10-CM | POA: Diagnosis not present

## 2017-12-22 DIAGNOSIS — M5412 Radiculopathy, cervical region: Secondary | ICD-10-CM | POA: Diagnosis not present

## 2017-12-22 DIAGNOSIS — M25511 Pain in right shoulder: Secondary | ICD-10-CM | POA: Diagnosis not present

## 2018-01-06 DIAGNOSIS — M546 Pain in thoracic spine: Secondary | ICD-10-CM | POA: Diagnosis not present

## 2018-01-06 DIAGNOSIS — G250 Essential tremor: Secondary | ICD-10-CM | POA: Diagnosis not present

## 2018-01-06 DIAGNOSIS — Z23 Encounter for immunization: Secondary | ICD-10-CM | POA: Diagnosis not present

## 2018-01-13 DIAGNOSIS — M47812 Spondylosis without myelopathy or radiculopathy, cervical region: Secondary | ICD-10-CM | POA: Diagnosis not present

## 2018-02-04 DIAGNOSIS — J069 Acute upper respiratory infection, unspecified: Secondary | ICD-10-CM | POA: Diagnosis not present

## 2018-02-04 DIAGNOSIS — R6889 Other general symptoms and signs: Secondary | ICD-10-CM | POA: Diagnosis not present

## 2018-02-04 DIAGNOSIS — R5381 Other malaise: Secondary | ICD-10-CM | POA: Diagnosis not present

## 2018-02-04 DIAGNOSIS — M159 Polyosteoarthritis, unspecified: Secondary | ICD-10-CM | POA: Diagnosis not present

## 2018-02-04 DIAGNOSIS — Z6832 Body mass index (BMI) 32.0-32.9, adult: Secondary | ICD-10-CM | POA: Diagnosis not present

## 2018-03-07 DIAGNOSIS — R69 Illness, unspecified: Secondary | ICD-10-CM | POA: Diagnosis not present

## 2018-03-07 DIAGNOSIS — G8929 Other chronic pain: Secondary | ICD-10-CM | POA: Diagnosis not present

## 2018-03-07 DIAGNOSIS — G252 Other specified forms of tremor: Secondary | ICD-10-CM | POA: Diagnosis not present

## 2018-03-07 DIAGNOSIS — G301 Alzheimer's disease with late onset: Secondary | ICD-10-CM | POA: Diagnosis not present

## 2018-03-07 DIAGNOSIS — M545 Low back pain: Secondary | ICD-10-CM | POA: Diagnosis not present

## 2018-03-07 DIAGNOSIS — M47896 Other spondylosis, lumbar region: Secondary | ICD-10-CM | POA: Diagnosis not present

## 2018-03-07 DIAGNOSIS — G2581 Restless legs syndrome: Secondary | ICD-10-CM | POA: Diagnosis not present

## 2018-03-21 DIAGNOSIS — R42 Dizziness and giddiness: Secondary | ICD-10-CM | POA: Diagnosis not present

## 2018-03-21 DIAGNOSIS — R7303 Prediabetes: Secondary | ICD-10-CM | POA: Diagnosis not present

## 2018-03-21 DIAGNOSIS — E7800 Pure hypercholesterolemia, unspecified: Secondary | ICD-10-CM | POA: Diagnosis not present

## 2018-03-24 DIAGNOSIS — R079 Chest pain, unspecified: Secondary | ICD-10-CM | POA: Insufficient documentation

## 2018-03-24 DIAGNOSIS — I209 Angina pectoris, unspecified: Secondary | ICD-10-CM | POA: Insufficient documentation

## 2018-03-24 HISTORY — DX: Angina pectoris, unspecified: I20.9

## 2018-03-24 HISTORY — DX: Chest pain, unspecified: R07.9

## 2018-04-18 DIAGNOSIS — D631 Anemia in chronic kidney disease: Secondary | ICD-10-CM | POA: Diagnosis not present

## 2018-04-18 DIAGNOSIS — I129 Hypertensive chronic kidney disease with stage 1 through stage 4 chronic kidney disease, or unspecified chronic kidney disease: Secondary | ICD-10-CM | POA: Diagnosis not present

## 2018-04-18 DIAGNOSIS — N182 Chronic kidney disease, stage 2 (mild): Secondary | ICD-10-CM | POA: Diagnosis not present

## 2018-04-18 DIAGNOSIS — E889 Metabolic disorder, unspecified: Secondary | ICD-10-CM | POA: Diagnosis not present

## 2018-04-18 DIAGNOSIS — M9080 Osteopathy in diseases classified elsewhere, unspecified site: Secondary | ICD-10-CM | POA: Diagnosis not present

## 2018-04-18 DIAGNOSIS — E559 Vitamin D deficiency, unspecified: Secondary | ICD-10-CM | POA: Diagnosis not present

## 2018-04-26 DIAGNOSIS — R42 Dizziness and giddiness: Secondary | ICD-10-CM | POA: Diagnosis not present

## 2018-04-28 DIAGNOSIS — R9082 White matter disease, unspecified: Secondary | ICD-10-CM | POA: Diagnosis not present

## 2018-04-28 DIAGNOSIS — R42 Dizziness and giddiness: Secondary | ICD-10-CM | POA: Diagnosis not present

## 2018-05-11 DIAGNOSIS — R69 Illness, unspecified: Secondary | ICD-10-CM | POA: Diagnosis not present

## 2018-05-11 DIAGNOSIS — Z7982 Long term (current) use of aspirin: Secondary | ICD-10-CM | POA: Diagnosis not present

## 2018-05-11 DIAGNOSIS — R079 Chest pain, unspecified: Secondary | ICD-10-CM | POA: Diagnosis not present

## 2018-05-11 DIAGNOSIS — J45909 Unspecified asthma, uncomplicated: Secondary | ICD-10-CM | POA: Diagnosis not present

## 2018-05-11 DIAGNOSIS — E785 Hyperlipidemia, unspecified: Secondary | ICD-10-CM | POA: Diagnosis not present

## 2018-05-11 DIAGNOSIS — R0602 Shortness of breath: Secondary | ICD-10-CM | POA: Diagnosis not present

## 2018-05-11 DIAGNOSIS — I129 Hypertensive chronic kidney disease with stage 1 through stage 4 chronic kidney disease, or unspecified chronic kidney disease: Secondary | ICD-10-CM | POA: Diagnosis not present

## 2018-05-11 DIAGNOSIS — R0789 Other chest pain: Secondary | ICD-10-CM | POA: Diagnosis not present

## 2018-05-11 DIAGNOSIS — Z79899 Other long term (current) drug therapy: Secondary | ICD-10-CM | POA: Diagnosis not present

## 2018-05-11 DIAGNOSIS — N184 Chronic kidney disease, stage 4 (severe): Secondary | ICD-10-CM | POA: Diagnosis not present

## 2018-05-11 DIAGNOSIS — K219 Gastro-esophageal reflux disease without esophagitis: Secondary | ICD-10-CM | POA: Diagnosis not present

## 2018-05-12 ENCOUNTER — Telehealth: Payer: Self-pay | Admitting: Cardiology

## 2018-05-12 ENCOUNTER — Other Ambulatory Visit: Payer: Self-pay

## 2018-05-12 NOTE — Progress Notes (Signed)
Virtual Visit via Video Note   This visit type was conducted due to national recommendations for restrictions regarding the COVID-19 Pandemic (e.g. social distancing) in an effort to limit this patient's exposure and mitigate transmission in our community.  Due to her co-morbid illnesses, this patient is at least at moderate risk for complications without adequate follow up.  This format is felt to be most appropriate for this patient at this time.  All issues noted in this document were discussed and addressed.  A limited physical exam was performed with this format.  Please refer to the patient's chart for her consent to telehealth for [facility].   Evaluation Performed:  Follow-up visit  Date:  05/13/2018   ID:  Sharon, Wise July 11, 1950, MRN [id number]  Patient Location: Home Provider Location: Home  PCP:  Earlyne Iba, NP  Cardiologist:  No primary care provider on file. Dr Bettina Gavia Electrophysiologist:  None   Chief Complaint:  Recent ED visit with chest pain  History of Present Illness:    Sharon Wise is a 68 y.o. female with hypertension stage III CKD and previous chest pain with a normal pharmacologic myocardial perfusion study performed in August 2019 referred from [facility] ED Dr. Colin Rhein after ED visit 05/11/2018 with chest pain.  The chest pain was nonexertional persistent substernal EKG was described as normal I requested it for personal review chest x-ray normal 2 troponins were undetectable BNP level was low 176 hemoglobin mildly diminished 11.0 GFR reduced 49 cc with a creatinine of 1.3.  She was referred for further evaluation after the ED visit.  Prior to the visit I reviewed extensive records including cardiology [facility] previous myocardial perfusion report records from neurology [facility] ED records including review of chest x-ray and independent review of EKG.  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  The patient does not have symptoms concerning for COVID-19 infection (fever, chills, cough, or new shortness of breath).   Is a very difficult visit we started video and even with coaching by me she could not overcome the technology and we is the audio to finish the visit.  She is to work at [employer] in housekeeping and recognizes my voice and knows who I am and retired about 15 years ago.  For many years she has been having chest pain that is worsened recently.  She is very reluctant to go to the emergency room.  She cares for her brother at home and she tells me when she does physical activity such as housework carrying laundry or vacuuming mopping she developed substernal pressure causes her to stop rest usually resolves but can take up to an hour at a time she has had a prescription for nitroglycerin in the past that was helpful the episodes now happen daily and she finally went to the emergency room because that episode would not stop at rest.  She tells me she was given 2 nitroglycerin in the emergency room that did provide her relief.  Both the patient I think that she has likely has obstructive CAD as a falsely normal myocardial perfusion study.  I have given her prescription for nitroglycerin and added a beta-blocker to her medical regimen and asked her to have an echocardiogram done in my office and to see me in about 1 month we likely will open up for elective testing and she needs to have a cardiac CTA performed at that time.  If  symptoms worsen or become unstable coronary angiography as an emergency would be indicated.  Chest pain is exertional substernal pressure usually mild to moderate does not radiate not pleuritic no associated GI symptoms nausea vomiting diaphoresis or shortness of breath it is atypical in that although it is improved with rest he can take 30 to 60 minutes for full relief.  She tells me many years ago likely 60 she had a normal heart catheterization there are  no records available and has no history of congenital or rheumatic heart disease Past Medical History:  Diagnosis Date   Arthritis    Chronic kidney disease    Hypertension    Tremor    Past Surgical History:  Procedure Laterality Date   CARPAL TUNNEL RELEASE     FOOT SURGERY     JOINT REPLACEMENT     REPLACEMENT UNICONDYLAR JOINT KNEE     VAGINAL HYSTERECTOMY       Current Meds  Medication Sig   acetaminophen (TYLENOL) 650 MG CR tablet Take 650 mg by mouth every 8 (eight) hours as needed.    ADVAIR HFA 230-21 MCG/ACT inhaler Inhale 1 puff into the lungs every 12 (twelve) hours.   amLODipine (NORVASC) 10 MG tablet Take 10 mg by mouth daily.   amoxicillin (AMOXIL) 500 MG capsule Take 500 mg by mouth 3 (three) times daily.    aspirin EC 81 MG tablet Take 81 mg by mouth daily.   clonazePAM (KLONOPIN) 0.5 MG tablet Take 0.5 mg by mouth 2 (two) times daily.    diclofenac (VOLTAREN) 50 MG EC tablet Take 50 mg by mouth 3 (three) times daily.    donepezil (ARICEPT ODT) 10 MG disintegrating tablet Take 10 mg by mouth daily.   gabapentin (NEURONTIN) 800 MG tablet Take 800 mg by mouth 3 (three) times daily as needed.    isosorbide mononitrate (IMDUR) 30 MG 24 hr tablet Take 30 mg by mouth daily.   memantine (NAMENDA) 5 MG tablet Take 5 mg by mouth 2 (two) times daily.   omeprazole (PRILOSEC) 20 MG capsule Take 20 mg by mouth daily.   oxybutynin (DITROPAN-XL) 5 MG 24 hr tablet Take 5 mg by mouth daily.   polyethylene glycol (MIRALAX / GLYCOLAX) 17 g packet Take 17 g by mouth daily as needed.   potassium chloride SA (K-DUR) 20 MEQ tablet Take 20 mEq by mouth every evening.   pravastatin (PRAVACHOL) 40 MG tablet Take 40 mg by mouth daily.   primidone (MYSOLINE) 50 MG tablet Take 50 mg by mouth 2 (two) times a day.    ranitidine (ZANTAC) 300 MG tablet Take 300 mg by mouth every evening.   rOPINIRole (REQUIP) 0.25 MG tablet Take 0.25 mg by mouth 2 (two) times a day.      Vitamin D, Ergocalciferol, (DRISDOL) 1.25 MG (50000 UT) CAPS capsule Take 50,000 Units by mouth once a week.     Allergies:   Meloxicam   Social History   Tobacco Use   Smoking status: Never Smoker   Smokeless tobacco: Never Used  Substance Use Topics   Alcohol use: Never    Frequency: Never   Drug use: Never     Family Hx: The patient's family history includes Cancer in her sister and sister; Heart disease in her father and mother; Hypertension in her mother.  ROS:   Please see the history of present illness.    Review of Systems  Constitution: Negative.  HENT: Negative.   Eyes: Negative.   Cardiovascular:  Positive for chest pain.  Respiratory: Negative.   Endocrine: Negative.   Hematologic/Lymphatic: Negative.   Skin: Negative.   Musculoskeletal: Positive for back pain and joint pain.  Gastrointestinal: Negative.   Genitourinary: Negative.   Neurological: Positive for difficulty with concentration (and memeory).  Psychiatric/Behavioral: Negative.   Allergic/Immunologic: Negative.    All other systems reviewed and are negative.   Prior CV studies:   The following studies were reviewed today:  See history  Labs/Other Tests and Data Reviewed:    EKG: Her EKG 05/10/2016 obtained reviewed poor quality baseline artifact sinus rhythm nonspecific ST-T changes personally reviewed  Recent Labs: See history  Recent Lipid Panel Lab Results  Component Value Date/Time   CHOL  05/01/2008 08:10 AM    155        ATP III CLASSIFICATION:  <200     mg/dL   Desirable  200-239  mg/dL   Borderline High  >=240    mg/dL   High          TRIG 115 05/01/2008 06:26 PM   HDL 55 05/01/2008 08:10 AM   CHOLHDL 2.8 05/01/2008 08:10 AM   LDLCALC  05/01/2008 08:10 AM    78        Total Cholesterol/HDL:CHD Risk Coronary Heart Disease Risk Table                     Men   Women  1/2 Average Risk   3.4   3.3  Average Risk       5.0   4.4  2 X Average Risk   9.6   7.1  3 X  Average Risk  23.4   11.0        Use the calculated Patient Ratio above and the CHD Risk Table to determine the patient's CHD Risk.        ATP III CLASSIFICATION (LDL):  <100     mg/dL   Optimal  100-129  mg/dL   Near or Above                    Optimal  130-159  mg/dL   Borderline  160-189  mg/dL   High  >190     mg/dL   Very High    Wt Readings from Last 3 Encounters:  No data found for Wt     Objective:    Vital Signs:  BP (!) 154/94 (BP Location: Left Wrist, Patient Position: Sitting)    Pulse 80    VITAL SIGNS:  reviewed PSYCH:  normal affect she was alert oriented thought and cognition were normal and she had no audible wheezing or respiratory distress during conversation  ASSESSMENT & PLAN:    1. Chest pain symptoms compatible with exertional angina New York Heart Association class II to class III intensify her medical treatment adding beta-blocker to her aspirin oral nitrate statin check echocardiogram for hypertrophic cardiomyopathy aortic stenosis and plan CT coronary angiogram electively.  She will also be given a prescription for nitroglycerin 2. Hypertensive kidney disease blood pressure not at target should benefit from adding beta-blocker recent kidney function stable 3. Hyperlipidemia stable continue her statin I utilize the K PN tool was unable to access her labs from her PCP  COVID-19 Education: The signs and symptoms of COVID-19 were discussed with the patient and how to seek care for testing (follow up with PCP or arrange E-visit).  The importance of social distancing was discussed today.  Time:  Today, I have spent 10minutes with the patient and with review review of complex medical records as detailed with telehealth technology discussing the above problems.     Medication Adjustments/Labs and Tests Ordered: Current medicines are reviewed at length with the patient today.  Concerns regarding medicines are outlined above.   Tests Ordered: Orders  Placed This Encounter  Procedures   ECHOCARDIOGRAM COMPLETE    Medication Changes: Meds ordered this encounter  Medications   nitroGLYCERIN (NITROSTAT) 0.4 MG SL tablet    Sig: Place 1 tablet (0.4 mg total) under the tongue every 5 (five) minutes as needed for up to 30 days for chest pain.    Dispense:  25 tablet    Refill:  11   metoprolol tartrate (LOPRESSOR) 25 MG tablet    Sig: Take 1 tablet (25 mg total) by mouth 2 (two) times daily.    Dispense:  180 tablet    Refill:  1    Disposition:  Follow up may 2020  Signed, Shirlee More, MD  05/13/2018 9:38 AM    [facility]

## 2018-05-12 NOTE — Telephone Encounter (Signed)
Virtual Visit Pre-Appointment Phone Call  "(Name), I am calling you today to discuss your upcoming appointment. We are currently trying to limit exposure to the virus that causes COVID-19 by seeing patients at home rather than in the office."  1. "What is the BEST phone number to call the day of the visit?" - include this in appointment notes  2. Do you have or have access to (through a family member/friend) a smartphone with video capability that we can use for your visit?" a. If yes - list this number in appt notes as cell (if different from BEST phone #) and list the appointment type as a VIDEO visit in appointment notes b. If no - list the appointment type as a PHONE visit in appointment notes  3. Confirm consent - "In the setting of the current Covid19 crisis, you are scheduled for a (phone or video) visit with your provider on (date) at (time).  Just as we do with many in-office visits, in order for you to participate in this visit, we must obtain consent.  If you'd like, I can send this to your mychart (if signed up) or email for you to review.  Otherwise, I can obtain your verbal consent now.  All virtual visits are billed to your insurance company just like a normal visit would be.  By agreeing to a virtual visit, we'd like you to understand that the technology does not allow for your provider to perform an examination, and thus may limit your provider's ability to fully assess your condition. If your provider identifies any concerns that need to be evaluated in person, we will make arrangements to do so.  Finally, though the technology is pretty good, we cannot assure that it will always work on either your or our end, and in the setting of a video visit, we may have to convert it to a phone-only visit.  In either situation, we cannot ensure that we have a secure connection.  Are you willing to proceed?" STAFF: Did the patient verbally acknowledge consent to telehealth visit? Document  YES/NO here: Yes  4. Advise patient to be prepared - "Two hours prior to your appointment, go ahead and check your blood pressure, pulse, oxygen saturation, and your weight (if you have the equipment to check those) and write them all down. When your visit starts, your provider will ask you for this information. If you have an Apple Watch or Kardia device, please plan to have heart rate information ready on the day of your appointment. Please have a pen and paper handy nearby the day of the visit as well."  5. Give patient instructions for MyChart download to smartphone OR [email or web address] as below if video visit (depending on what platform provider is using)  6. Inform patient they will receive a phone call 15 minutes prior to their appointment time (may be from unknown caller ID) so they should be prepared to answer    [place]   I hereby voluntarily request, consent and authorize [facility] and its employed or contracted physicians, physician assistants, nurse practitioners or other licensed health care professionals (the Practitioner), to provide me with telemedicine health care services (the Services") as deemed necessary by the treating Practitioner. I acknowledge and consent to receive the Services by the Practitioner via telemedicine. I understand that the telemedicine visit will involve communicating with the Practitioner through live audiovisual communication technology and the disclosure of certain medical information by electronic transmission.  I acknowledge that I have been given the opportunity to request an in-person assessment or other available alternative prior to the telemedicine visit and am voluntarily participating in the telemedicine visit.  I understand that I have the right to withhold or withdraw my consent to the use of telemedicine in the course of my care at any time, without affecting my right to future care or treatment, and that the  Practitioner or I may terminate the telemedicine visit at any time. I understand that I have the right to inspect all information obtained and/or recorded in the course of the telemedicine visit and may receive copies of available information for a reasonable fee.  I understand that some of the potential risks of receiving the Services via telemedicine include:   Delay or interruption in medical evaluation due to technological equipment failure or disruption;  Information transmitted may not be sufficient (e.g. poor resolution of images) to allow for appropriate medical decision making by the Practitioner; and/or   In rare instances, security protocols could fail, causing a breach of personal health information.  Furthermore, I acknowledge that it is my responsibility to provide information about my medical history, conditions and care that is complete and accurate to the best of my ability. I acknowledge that Practitioner's advice, recommendations, and/or decision may be based on factors not within their control, such as incomplete or inaccurate data provided by me or distortions of diagnostic images or specimens that may result from electronic transmissions. I understand that the practice of medicine is not an exact science and that Practitioner makes no warranties or guarantees regarding treatment outcomes. I acknowledge that I will receive a copy of this consent concurrently upon execution via email to the email address I last provided but may also request a printed copy by calling the office of [facility].    I understand that my insurance will be billed for this visit.   I have read or had this consent read to me.  I understand the contents of this consent, which adequately explains the benefits and risks of the Services being provided via telemedicine.   I have been provided ample opportunity to ask questions regarding this consent and the Services and have had my questions answered to my  satisfaction.  I give my informed consent for the services to be provided through the use of telemedicine in my medical care  By participating in this telemedicine visit I agree to the above.

## 2018-05-13 ENCOUNTER — Telehealth (INDEPENDENT_AMBULATORY_CARE_PROVIDER_SITE_OTHER): Payer: Medicare HMO | Admitting: Cardiology

## 2018-05-13 ENCOUNTER — Other Ambulatory Visit: Payer: Self-pay

## 2018-05-13 ENCOUNTER — Encounter: Payer: Self-pay | Admitting: Cardiology

## 2018-05-13 VITALS — BP 154/94 | HR 80

## 2018-05-13 DIAGNOSIS — E785 Hyperlipidemia, unspecified: Secondary | ICD-10-CM

## 2018-05-13 DIAGNOSIS — E782 Mixed hyperlipidemia: Secondary | ICD-10-CM

## 2018-05-13 DIAGNOSIS — N183 Chronic kidney disease, stage 3 unspecified: Secondary | ICD-10-CM

## 2018-05-13 DIAGNOSIS — R079 Chest pain, unspecified: Secondary | ICD-10-CM

## 2018-05-13 DIAGNOSIS — I129 Hypertensive chronic kidney disease with stage 1 through stage 4 chronic kidney disease, or unspecified chronic kidney disease: Secondary | ICD-10-CM

## 2018-05-13 HISTORY — DX: Hyperlipidemia, unspecified: E78.5

## 2018-05-13 MED ORDER — METOPROLOL TARTRATE 25 MG PO TABS: 25.0000 mg | ORAL_TABLET | Freq: Two times a day (BID) | ORAL | 1 refills | Status: DC

## 2018-05-13 MED ORDER — NITROGLYCERIN 0.4 MG SL SUBL: 0.4000 mg | SUBLINGUAL_TABLET | SUBLINGUAL | 11 refills | Status: DC | PRN

## 2018-05-13 NOTE — Patient Instructions (Addendum)
Medication Instructions:  Your physician has recommended you make the following change in your medication:  START: metoprolol tartrate (lopressor) 25mg  one tablet twice daily START: nitroglycerin 0.4mg  one tablet sublingual (under the tongue) as needed for chest pain.  When having chest pain, stop what you are doing and sit down. Take 1 nitro, wait 5 minutes. Still having chest pain, take 1 nitro, wait 5 minutes. Still having chest pain, take 1 nitro, dial 911. Total of 3 nitro in 15 minutes.   If you need a refill on your cardiac medications before your next appointment, please call your pharmacy.   Lab work: NONE  If you have labs (blood work) drawn today and your tests are completely normal, you will receive your results only by: Marland Kitchen MyChart Message (if you have MyChart) OR . A paper copy in the mail If you have any lab test that is abnormal or we need to change your treatment, we will call you to review the results.  Testing/Procedures: Your physician has requested that you have an echocardiogram. Echocardiography is a painless test that uses sound waves to create images of your heart. It provides your doctor with information about the size and shape of your heart and how well your heart's chambers and valves are working. This procedure takes approximately one hour. There are no restrictions for this procedure. This will be done in the [place] office on Wednesday, 05/18/2018, at 9:15 am.     Follow-Up: At [facility], you and your health needs are our priority.  As part of our continuing mission to provide you with exceptional heart care, we have created designated Provider Care Teams.  These Care Teams include your primary Cardiologist (physician) and Advanced Practice Providers (APPs -  Physician Assistants and Nurse Practitioners) who all work together to provide you with the care you need, when you need it. . You will need a televisit follow up appointment on Wednesday, 06/15/2018,  at 10:20 am.     Nitroglycerin sublingual tablets What is this medicine? NITROGLYCERIN (nye troe GLI ser in) is a type of vasodilator. It relaxes blood vessels, increasing the blood and oxygen supply to your heart. This medicine is used to relieve chest pain caused by angina. It is also used to prevent chest pain before activities like climbing stairs, going outdoors in cold weather, or sexual activity. This medicine may be used for other purposes; ask your health care provider or pharmacist if you have questions. COMMON BRAND NAME(S): Nitroquick, Nitrostat, Nitrotab What should I tell my health care provider before I take this medicine? They need to know if you have any of these conditions: -anemia -head injury, recent stroke, or bleeding in the brain -liver disease -previous heart attack -an unusual or allergic reaction to nitroglycerin, other medicines, foods, dyes, or preservatives -pregnant or trying to get pregnant -breast-feeding How should I use this medicine? Take this medicine by mouth as needed. At the first sign of an angina attack (chest pain or tightness) place one tablet under your tongue. You can also take this medicine 5 to 10 minutes before an event likely to produce chest pain. Follow the directions on the prescription label. Let the tablet dissolve under the tongue. Do not swallow whole. Replace the dose if you accidentally swallow it. It will help if your mouth is not dry. Saliva around the tablet will help it to dissolve more quickly. Do not eat or drink, smoke or chew tobacco while a tablet is dissolving. If you are not  better within 5 minutes after taking ONE dose of nitroglycerin, call 9-1-1 immediately to seek emergency medical care. Do not take more than 3 nitroglycerin tablets over 15 minutes. If you take this medicine often to relieve symptoms of angina, your doctor or health care professional may provide you with different instructions to manage your symptoms. If  symptoms do not go away after following these instructions, it is important to call 9-1-1 immediately. Do not take more than 3 nitroglycerin tablets over 15 minutes. Talk to your pediatrician regarding the use of this medicine in children. Special care may be needed. Overdosage: If you think you have taken too much of this medicine contact a poison control center or emergency room at once. NOTE: This medicine is only for you. Do not share this medicine with others. What if I miss a dose? This does not apply. This medicine is only used as needed. What may interact with this medicine? Do not take this medicine with any of the following medications: -certain migraine medicines like ergotamine and dihydroergotamine (DHE) -medicines used to treat erectile dysfunction like sildenafil, tadalafil, and vardenafil -riociguat This medicine may also interact with the following medications: -alteplase -aspirin -heparin -medicines for high blood pressure -medicines for mental depression -other medicines used to treat angina -phenothiazines like chlorpromazine, mesoridazine, prochlorperazine, thioridazine This list may not describe all possible interactions. Give your health care provider a list of all the medicines, herbs, non-prescription drugs, or dietary supplements you use. Also tell them if you smoke, drink alcohol, or use illegal drugs. Some items may interact with your medicine. What should I watch for while using this medicine? Tell your doctor or health care professional if you feel your medicine is no longer working. Keep this medicine with you at all times. Sit or lie down when you take your medicine to prevent falling if you feel dizzy or faint after using it. Try to remain calm. This will help you to feel better faster. If you feel dizzy, take several deep breaths and lie down with your feet propped up, or bend forward with your head resting between your knees. You may get drowsy or dizzy. Do  not drive, use machinery, or do anything that needs mental alertness until you know how this drug affects you. Do not stand or sit up quickly, especially if you are an older patient. This reduces the risk of dizzy or fainting spells. Alcohol can make you more drowsy and dizzy. Avoid alcoholic drinks. Do not treat yourself for coughs, colds, or pain while you are taking this medicine without asking your doctor or health care professional for advice. Some ingredients may increase your blood pressure. What side effects may I notice from receiving this medicine? Side effects that you should report to your doctor or health care professional as soon as possible: -blurred vision -dry mouth -skin rash -sweating -the feeling of extreme pressure in the head -unusually weak or tired Side effects that usually do not require medical attention (report to your doctor or health care professional if they continue or are bothersome): -flushing of the face or neck -headache -irregular heartbeat, palpitations -nausea, vomiting This list may not describe all possible side effects. Call your doctor for medical advice about side effects. You may report side effects to FDA at 1-800-FDA-1088. Where should I keep my medicine? Keep out of the reach of children. Store at room temperature between 20 and 25 degrees C (68 and 77 degrees F). Store in [facility] [employer]. Protect from light and  moisture. Keep tightly closed. Throw away any unused medicine after the expiration date. NOTE: This sheet is a summary. It may not cover all possible information. If you have questions about this medicine, talk to your doctor, pharmacist, or health care provider.  2019 Elsevier/Gold Standard (2012-11-03 17:57:36)     Metoprolol tablets What is this medicine? METOPROLOL (me TOE proe lole) is a beta-blocker. Beta-blockers reduce the workload on the heart and help it to beat more regularly. This medicine is used to treat high blood  pressure and to prevent chest pain. It is also used to after a heart attack and to prevent an additional heart attack from occurring. This medicine may be used for other purposes; ask your health care provider or pharmacist if you have questions. COMMON BRAND NAME(S): Lopressor What should I tell my health care provider before I take this medicine? They need to know if you have any of these conditions: -diabetes -heart or vessel disease like slow heart rate, worsening heart failure, heart block, sick sinus syndrome or Raynaud's disease -kidney disease -liver disease -lung or breathing disease, like asthma or emphysema -pheochromocytoma -thyroid disease -an unusual or allergic reaction to metoprolol, other beta-blockers, medicines, foods, dyes, or preservatives -pregnant or trying to get pregnant -breast-feeding How should I use this medicine? Take this medicine by mouth with a drink of water. Follow the directions on the prescription label. Take this medicine immediately after meals. Take your doses at regular intervals. Do not take more medicine than directed. Do not stop taking this medicine suddenly. This could lead to serious heart-related effects. Talk to your pediatrician regarding the use of this medicine in children. Special care may be needed. Overdosage: If you think you have taken too much of this medicine contact a poison control center or emergency room at once. NOTE: This medicine is only for you. Do not share this medicine with others. What if I miss a dose? If you miss a dose, take it as soon as you can. If it is almost time for your next dose, take only that dose. Do not take double or extra doses. What may interact with this medicine? This medicine may interact with the following medications: -certain medicines for blood pressure, heart disease, irregular heart beat -certain medicines for depression like monoamine oxidase (MAO) inhibitors, fluoxetine, or  paroxetine -clonidine -dobutamine -epinephrine -isoproterenol -reserpine This list may not describe all possible interactions. Give your health care provider a list of all the medicines, herbs, non-prescription drugs, or dietary supplements you use. Also tell them if you smoke, drink alcohol, or use illegal drugs. Some items may interact with your medicine. What should I watch for while using this medicine? Visit your doctor or health care professional for regular check ups. Contact your doctor right away if your symptoms worsen. Check your blood pressure and pulse rate regularly. Ask your health care professional what your blood pressure and pulse rate should be, and when you should contact them. You may get drowsy or dizzy. Do not drive, use machinery, or do anything that needs mental alertness until you know how this medicine affects you. Do not sit or stand up quickly, especially if you are an older patient. This reduces the risk of dizzy or fainting spells. Contact your doctor if these symptoms continue. Alcohol may interfere with the effect of this medicine. Avoid alcoholic drinks. What side effects may I notice from receiving this medicine? Side effects that you should report to your doctor or health care professional as  soon as possible: -allergic reactions like skin rash, itching or hives -cold or numb hands or feet -depression -difficulty breathing -faint -fever with sore throat -irregular heartbeat, chest pain -rapid weight gain -swollen legs or ankles Side effects that usually do not require medical attention (report to your doctor or health care professional if they continue or are bothersome): -anxiety or nervousness -change in sex drive or performance -dry skin -headache -nightmares or trouble sleeping -short term memory loss -stomach upset or diarrhea -unusually tired This list may not describe all possible side effects. Call your doctor for medical advice about side  effects. You may report side effects to FDA at 1-800-FDA-1088. Where should I keep my medicine? Keep out of the reach of children. Store at room temperature between 15 and 30 degrees C (59 and 86 degrees F). Throw away any unused medicine after the expiration date. NOTE: This sheet is a summary. It may not cover all possible information. If you have questions about this medicine, talk to your doctor, pharmacist, or health care provider.  2019 Elsevier/Gold Standard (2012-09-09 14:40:36)      Echocardiogram An echocardiogram is a procedure that uses painless sound waves (ultrasound) to produce an image of the heart. Images from an echocardiogram can provide important information about:  Signs of coronary artery disease (CAD).  Aneurysm detection. An aneurysm is a weak or damaged part of an artery wall that bulges out from the normal force of blood pumping through the body.  Heart size and shape. Changes in the size or shape of the heart can be associated with certain conditions, including heart failure, aneurysm, and CAD.  Heart muscle function.  Heart valve function.  Signs of a past heart attack.  Fluid buildup around the heart.  Thickening of the heart muscle.  A tumor or infectious growth around the heart valves. Tell a health care provider about:  Any allergies you have.  All medicines you are taking, including vitamins, herbs, eye drops, creams, and over-the-counter medicines.  Any blood disorders you have.  Any surgeries you have had.  Any medical conditions you have.  Whether you are pregnant or may be pregnant. What are the risks? Generally, this is a safe procedure. However, problems may occur, including:  Allergic reaction to dye (contrast) that may be used during the procedure. What happens before the procedure? No specific preparation is needed. You may eat and drink normally. What happens during the procedure?   An IV tube may be inserted into one of  your veins.  You may receive contrast through this tube. A contrast is an injection that improves the quality of the pictures from your heart.  A gel will be applied to your chest.  A wand-like tool (transducer) will be moved over your chest. The gel will help to transmit the sound waves from the transducer.  The sound waves will harmlessly bounce off of your heart to allow the heart images to be captured in real-time motion. The images will be recorded on a computer. The procedure may vary among health care providers and hospitals. What happens after the procedure?  You may return to your normal, everyday life, including diet, activities, and medicines, unless your health care provider tells you not to do that. Summary  An echocardiogram is a procedure that uses painless sound waves (ultrasound) to produce an image of the heart.  Images from an echocardiogram can provide important information about the size and shape of your heart, heart muscle function, heart valve function,  and fluid buildup around your heart.  You do not need to do anything to prepare before this procedure. You may eat and drink normally.  After the echocardiogram is completed, you may return to your normal, everyday life, unless your health care provider tells you not to do that. This information is not intended to replace advice given to you by your health care provider. Make sure you discuss any questions you have with your health care provider. Document Released: 01/03/2000 Document Revised: 02/08/2016 Document Reviewed: 02/08/2016 Elsevier Interactive Patient Education  2019 [employer].

## 2018-05-17 ENCOUNTER — Other Ambulatory Visit: Payer: Self-pay

## 2018-05-18 ENCOUNTER — Other Ambulatory Visit: Payer: Self-pay

## 2018-05-18 ENCOUNTER — Ambulatory Visit (INDEPENDENT_AMBULATORY_CARE_PROVIDER_SITE_OTHER): Payer: Medicare HMO

## 2018-05-18 DIAGNOSIS — R079 Chest pain, unspecified: Secondary | ICD-10-CM

## 2018-05-18 NOTE — Progress Notes (Signed)
Complete echocardiogram has been performed.  Jimmy Nazire Fruth RDCS, RVT 

## 2018-05-23 DIAGNOSIS — Z79899 Other long term (current) drug therapy: Secondary | ICD-10-CM | POA: Diagnosis not present

## 2018-05-23 DIAGNOSIS — D649 Anemia, unspecified: Secondary | ICD-10-CM | POA: Diagnosis not present

## 2018-05-23 DIAGNOSIS — R531 Weakness: Secondary | ICD-10-CM | POA: Diagnosis not present

## 2018-05-23 DIAGNOSIS — K219 Gastro-esophageal reflux disease without esophagitis: Secondary | ICD-10-CM | POA: Diagnosis not present

## 2018-05-23 DIAGNOSIS — R5383 Other fatigue: Secondary | ICD-10-CM | POA: Diagnosis not present

## 2018-05-23 DIAGNOSIS — R0602 Shortness of breath: Secondary | ICD-10-CM | POA: Diagnosis not present

## 2018-05-23 DIAGNOSIS — R29898 Other symptoms and signs involving the musculoskeletal system: Secondary | ICD-10-CM | POA: Diagnosis not present

## 2018-05-25 DIAGNOSIS — I129 Hypertensive chronic kidney disease with stage 1 through stage 4 chronic kidney disease, or unspecified chronic kidney disease: Secondary | ICD-10-CM | POA: Diagnosis not present

## 2018-05-25 DIAGNOSIS — R69 Illness, unspecified: Secondary | ICD-10-CM | POA: Diagnosis not present

## 2018-05-25 DIAGNOSIS — G479 Sleep disorder, unspecified: Secondary | ICD-10-CM | POA: Diagnosis not present

## 2018-05-25 DIAGNOSIS — G309 Alzheimer's disease, unspecified: Secondary | ICD-10-CM | POA: Diagnosis not present

## 2018-05-25 DIAGNOSIS — N189 Chronic kidney disease, unspecified: Secondary | ICD-10-CM | POA: Diagnosis not present

## 2018-05-25 DIAGNOSIS — M549 Dorsalgia, unspecified: Secondary | ICD-10-CM | POA: Diagnosis not present

## 2018-05-25 DIAGNOSIS — G250 Essential tremor: Secondary | ICD-10-CM | POA: Diagnosis not present

## 2018-05-25 DIAGNOSIS — Z9181 History of falling: Secondary | ICD-10-CM | POA: Diagnosis not present

## 2018-05-25 DIAGNOSIS — K219 Gastro-esophageal reflux disease without esophagitis: Secondary | ICD-10-CM | POA: Diagnosis not present

## 2018-05-25 DIAGNOSIS — G8929 Other chronic pain: Secondary | ICD-10-CM | POA: Diagnosis not present

## 2018-05-25 DIAGNOSIS — E559 Vitamin D deficiency, unspecified: Secondary | ICD-10-CM | POA: Diagnosis not present

## 2018-05-25 DIAGNOSIS — R202 Paresthesia of skin: Secondary | ICD-10-CM | POA: Diagnosis not present

## 2018-05-30 DIAGNOSIS — D649 Anemia, unspecified: Secondary | ICD-10-CM | POA: Diagnosis not present

## 2018-05-31 DIAGNOSIS — N189 Chronic kidney disease, unspecified: Secondary | ICD-10-CM | POA: Diagnosis not present

## 2018-05-31 DIAGNOSIS — E559 Vitamin D deficiency, unspecified: Secondary | ICD-10-CM | POA: Diagnosis not present

## 2018-05-31 DIAGNOSIS — G250 Essential tremor: Secondary | ICD-10-CM | POA: Diagnosis not present

## 2018-05-31 DIAGNOSIS — R29898 Other symptoms and signs involving the musculoskeletal system: Secondary | ICD-10-CM | POA: Diagnosis not present

## 2018-05-31 DIAGNOSIS — G8929 Other chronic pain: Secondary | ICD-10-CM | POA: Diagnosis not present

## 2018-05-31 DIAGNOSIS — M549 Dorsalgia, unspecified: Secondary | ICD-10-CM | POA: Diagnosis not present

## 2018-05-31 DIAGNOSIS — R202 Paresthesia of skin: Secondary | ICD-10-CM | POA: Diagnosis not present

## 2018-05-31 DIAGNOSIS — Z9181 History of falling: Secondary | ICD-10-CM | POA: Diagnosis not present

## 2018-05-31 DIAGNOSIS — K219 Gastro-esophageal reflux disease without esophagitis: Secondary | ICD-10-CM | POA: Diagnosis not present

## 2018-05-31 DIAGNOSIS — I129 Hypertensive chronic kidney disease with stage 1 through stage 4 chronic kidney disease, or unspecified chronic kidney disease: Secondary | ICD-10-CM | POA: Diagnosis not present

## 2018-05-31 DIAGNOSIS — R69 Illness, unspecified: Secondary | ICD-10-CM | POA: Diagnosis not present

## 2018-05-31 DIAGNOSIS — G309 Alzheimer's disease, unspecified: Secondary | ICD-10-CM | POA: Diagnosis not present

## 2018-05-31 DIAGNOSIS — G479 Sleep disorder, unspecified: Secondary | ICD-10-CM | POA: Diagnosis not present

## 2018-06-01 DIAGNOSIS — G8929 Other chronic pain: Secondary | ICD-10-CM | POA: Diagnosis not present

## 2018-06-01 DIAGNOSIS — I129 Hypertensive chronic kidney disease with stage 1 through stage 4 chronic kidney disease, or unspecified chronic kidney disease: Secondary | ICD-10-CM | POA: Diagnosis not present

## 2018-06-01 DIAGNOSIS — K219 Gastro-esophageal reflux disease without esophagitis: Secondary | ICD-10-CM | POA: Diagnosis not present

## 2018-06-01 DIAGNOSIS — G479 Sleep disorder, unspecified: Secondary | ICD-10-CM | POA: Diagnosis not present

## 2018-06-01 DIAGNOSIS — G309 Alzheimer's disease, unspecified: Secondary | ICD-10-CM | POA: Diagnosis not present

## 2018-06-01 DIAGNOSIS — R202 Paresthesia of skin: Secondary | ICD-10-CM | POA: Diagnosis not present

## 2018-06-01 DIAGNOSIS — R69 Illness, unspecified: Secondary | ICD-10-CM | POA: Diagnosis not present

## 2018-06-01 DIAGNOSIS — E559 Vitamin D deficiency, unspecified: Secondary | ICD-10-CM | POA: Diagnosis not present

## 2018-06-01 DIAGNOSIS — Z9181 History of falling: Secondary | ICD-10-CM | POA: Diagnosis not present

## 2018-06-01 DIAGNOSIS — N189 Chronic kidney disease, unspecified: Secondary | ICD-10-CM | POA: Diagnosis not present

## 2018-06-01 DIAGNOSIS — G250 Essential tremor: Secondary | ICD-10-CM | POA: Diagnosis not present

## 2018-06-01 DIAGNOSIS — M549 Dorsalgia, unspecified: Secondary | ICD-10-CM | POA: Diagnosis not present

## 2018-06-02 DIAGNOSIS — G8929 Other chronic pain: Secondary | ICD-10-CM | POA: Diagnosis not present

## 2018-06-02 DIAGNOSIS — G250 Essential tremor: Secondary | ICD-10-CM | POA: Diagnosis not present

## 2018-06-02 DIAGNOSIS — K219 Gastro-esophageal reflux disease without esophagitis: Secondary | ICD-10-CM | POA: Diagnosis not present

## 2018-06-02 DIAGNOSIS — R202 Paresthesia of skin: Secondary | ICD-10-CM | POA: Diagnosis not present

## 2018-06-02 DIAGNOSIS — E559 Vitamin D deficiency, unspecified: Secondary | ICD-10-CM | POA: Diagnosis not present

## 2018-06-02 DIAGNOSIS — K5900 Constipation, unspecified: Secondary | ICD-10-CM | POA: Diagnosis not present

## 2018-06-02 DIAGNOSIS — G309 Alzheimer's disease, unspecified: Secondary | ICD-10-CM | POA: Diagnosis not present

## 2018-06-02 DIAGNOSIS — R69 Illness, unspecified: Secondary | ICD-10-CM | POA: Diagnosis not present

## 2018-06-02 DIAGNOSIS — N189 Chronic kidney disease, unspecified: Secondary | ICD-10-CM | POA: Diagnosis not present

## 2018-06-02 DIAGNOSIS — R195 Other fecal abnormalities: Secondary | ICD-10-CM | POA: Diagnosis not present

## 2018-06-02 DIAGNOSIS — M549 Dorsalgia, unspecified: Secondary | ICD-10-CM | POA: Diagnosis not present

## 2018-06-02 DIAGNOSIS — I129 Hypertensive chronic kidney disease with stage 1 through stage 4 chronic kidney disease, or unspecified chronic kidney disease: Secondary | ICD-10-CM | POA: Diagnosis not present

## 2018-06-02 DIAGNOSIS — G479 Sleep disorder, unspecified: Secondary | ICD-10-CM | POA: Diagnosis not present

## 2018-06-02 DIAGNOSIS — Z9181 History of falling: Secondary | ICD-10-CM | POA: Diagnosis not present

## 2018-06-03 DIAGNOSIS — G8929 Other chronic pain: Secondary | ICD-10-CM | POA: Diagnosis not present

## 2018-06-03 DIAGNOSIS — E559 Vitamin D deficiency, unspecified: Secondary | ICD-10-CM | POA: Diagnosis not present

## 2018-06-03 DIAGNOSIS — G250 Essential tremor: Secondary | ICD-10-CM | POA: Diagnosis not present

## 2018-06-03 DIAGNOSIS — N189 Chronic kidney disease, unspecified: Secondary | ICD-10-CM | POA: Diagnosis not present

## 2018-06-03 DIAGNOSIS — G479 Sleep disorder, unspecified: Secondary | ICD-10-CM | POA: Diagnosis not present

## 2018-06-03 DIAGNOSIS — R69 Illness, unspecified: Secondary | ICD-10-CM | POA: Diagnosis not present

## 2018-06-03 DIAGNOSIS — M549 Dorsalgia, unspecified: Secondary | ICD-10-CM | POA: Diagnosis not present

## 2018-06-03 DIAGNOSIS — R202 Paresthesia of skin: Secondary | ICD-10-CM | POA: Diagnosis not present

## 2018-06-03 DIAGNOSIS — K219 Gastro-esophageal reflux disease without esophagitis: Secondary | ICD-10-CM | POA: Diagnosis not present

## 2018-06-03 DIAGNOSIS — I129 Hypertensive chronic kidney disease with stage 1 through stage 4 chronic kidney disease, or unspecified chronic kidney disease: Secondary | ICD-10-CM | POA: Diagnosis not present

## 2018-06-03 DIAGNOSIS — G309 Alzheimer's disease, unspecified: Secondary | ICD-10-CM | POA: Diagnosis not present

## 2018-06-03 DIAGNOSIS — Z9181 History of falling: Secondary | ICD-10-CM | POA: Diagnosis not present

## 2018-06-06 DIAGNOSIS — N189 Chronic kidney disease, unspecified: Secondary | ICD-10-CM | POA: Diagnosis not present

## 2018-06-06 DIAGNOSIS — K219 Gastro-esophageal reflux disease without esophagitis: Secondary | ICD-10-CM | POA: Diagnosis not present

## 2018-06-06 DIAGNOSIS — R202 Paresthesia of skin: Secondary | ICD-10-CM | POA: Diagnosis not present

## 2018-06-06 DIAGNOSIS — E559 Vitamin D deficiency, unspecified: Secondary | ICD-10-CM | POA: Diagnosis not present

## 2018-06-06 DIAGNOSIS — G479 Sleep disorder, unspecified: Secondary | ICD-10-CM | POA: Diagnosis not present

## 2018-06-06 DIAGNOSIS — G250 Essential tremor: Secondary | ICD-10-CM | POA: Diagnosis not present

## 2018-06-06 DIAGNOSIS — G309 Alzheimer's disease, unspecified: Secondary | ICD-10-CM | POA: Diagnosis not present

## 2018-06-06 DIAGNOSIS — Z9181 History of falling: Secondary | ICD-10-CM | POA: Diagnosis not present

## 2018-06-06 DIAGNOSIS — R69 Illness, unspecified: Secondary | ICD-10-CM | POA: Diagnosis not present

## 2018-06-06 DIAGNOSIS — G8929 Other chronic pain: Secondary | ICD-10-CM | POA: Diagnosis not present

## 2018-06-06 DIAGNOSIS — M549 Dorsalgia, unspecified: Secondary | ICD-10-CM | POA: Diagnosis not present

## 2018-06-06 DIAGNOSIS — I129 Hypertensive chronic kidney disease with stage 1 through stage 4 chronic kidney disease, or unspecified chronic kidney disease: Secondary | ICD-10-CM | POA: Diagnosis not present

## 2018-06-08 DIAGNOSIS — G8929 Other chronic pain: Secondary | ICD-10-CM | POA: Diagnosis not present

## 2018-06-08 DIAGNOSIS — G250 Essential tremor: Secondary | ICD-10-CM | POA: Diagnosis not present

## 2018-06-08 DIAGNOSIS — K219 Gastro-esophageal reflux disease without esophagitis: Secondary | ICD-10-CM | POA: Diagnosis not present

## 2018-06-08 DIAGNOSIS — R69 Illness, unspecified: Secondary | ICD-10-CM | POA: Diagnosis not present

## 2018-06-08 DIAGNOSIS — I129 Hypertensive chronic kidney disease with stage 1 through stage 4 chronic kidney disease, or unspecified chronic kidney disease: Secondary | ICD-10-CM | POA: Diagnosis not present

## 2018-06-08 DIAGNOSIS — N189 Chronic kidney disease, unspecified: Secondary | ICD-10-CM | POA: Diagnosis not present

## 2018-06-08 DIAGNOSIS — G479 Sleep disorder, unspecified: Secondary | ICD-10-CM | POA: Diagnosis not present

## 2018-06-08 DIAGNOSIS — E559 Vitamin D deficiency, unspecified: Secondary | ICD-10-CM | POA: Diagnosis not present

## 2018-06-08 DIAGNOSIS — Z9181 History of falling: Secondary | ICD-10-CM | POA: Diagnosis not present

## 2018-06-08 DIAGNOSIS — G309 Alzheimer's disease, unspecified: Secondary | ICD-10-CM | POA: Diagnosis not present

## 2018-06-08 DIAGNOSIS — R202 Paresthesia of skin: Secondary | ICD-10-CM | POA: Diagnosis not present

## 2018-06-08 DIAGNOSIS — M549 Dorsalgia, unspecified: Secondary | ICD-10-CM | POA: Diagnosis not present

## 2018-06-09 DIAGNOSIS — G479 Sleep disorder, unspecified: Secondary | ICD-10-CM | POA: Diagnosis not present

## 2018-06-09 DIAGNOSIS — G8929 Other chronic pain: Secondary | ICD-10-CM | POA: Diagnosis not present

## 2018-06-09 DIAGNOSIS — I129 Hypertensive chronic kidney disease with stage 1 through stage 4 chronic kidney disease, or unspecified chronic kidney disease: Secondary | ICD-10-CM | POA: Diagnosis not present

## 2018-06-09 DIAGNOSIS — Z9181 History of falling: Secondary | ICD-10-CM | POA: Diagnosis not present

## 2018-06-09 DIAGNOSIS — R69 Illness, unspecified: Secondary | ICD-10-CM | POA: Diagnosis not present

## 2018-06-09 DIAGNOSIS — N189 Chronic kidney disease, unspecified: Secondary | ICD-10-CM | POA: Diagnosis not present

## 2018-06-09 DIAGNOSIS — E559 Vitamin D deficiency, unspecified: Secondary | ICD-10-CM | POA: Diagnosis not present

## 2018-06-09 DIAGNOSIS — G250 Essential tremor: Secondary | ICD-10-CM | POA: Diagnosis not present

## 2018-06-09 DIAGNOSIS — R202 Paresthesia of skin: Secondary | ICD-10-CM | POA: Diagnosis not present

## 2018-06-09 DIAGNOSIS — K219 Gastro-esophageal reflux disease without esophagitis: Secondary | ICD-10-CM | POA: Diagnosis not present

## 2018-06-09 DIAGNOSIS — M549 Dorsalgia, unspecified: Secondary | ICD-10-CM | POA: Diagnosis not present

## 2018-06-09 DIAGNOSIS — G309 Alzheimer's disease, unspecified: Secondary | ICD-10-CM | POA: Diagnosis not present

## 2018-06-10 DIAGNOSIS — G309 Alzheimer's disease, unspecified: Secondary | ICD-10-CM | POA: Diagnosis not present

## 2018-06-10 DIAGNOSIS — G8929 Other chronic pain: Secondary | ICD-10-CM | POA: Diagnosis not present

## 2018-06-10 DIAGNOSIS — R202 Paresthesia of skin: Secondary | ICD-10-CM | POA: Diagnosis not present

## 2018-06-10 DIAGNOSIS — I129 Hypertensive chronic kidney disease with stage 1 through stage 4 chronic kidney disease, or unspecified chronic kidney disease: Secondary | ICD-10-CM | POA: Diagnosis not present

## 2018-06-10 DIAGNOSIS — Z9181 History of falling: Secondary | ICD-10-CM | POA: Diagnosis not present

## 2018-06-10 DIAGNOSIS — G250 Essential tremor: Secondary | ICD-10-CM | POA: Diagnosis not present

## 2018-06-10 DIAGNOSIS — N189 Chronic kidney disease, unspecified: Secondary | ICD-10-CM | POA: Diagnosis not present

## 2018-06-10 DIAGNOSIS — M549 Dorsalgia, unspecified: Secondary | ICD-10-CM | POA: Diagnosis not present

## 2018-06-10 DIAGNOSIS — E559 Vitamin D deficiency, unspecified: Secondary | ICD-10-CM | POA: Diagnosis not present

## 2018-06-10 DIAGNOSIS — G479 Sleep disorder, unspecified: Secondary | ICD-10-CM | POA: Diagnosis not present

## 2018-06-10 DIAGNOSIS — K219 Gastro-esophageal reflux disease without esophagitis: Secondary | ICD-10-CM | POA: Diagnosis not present

## 2018-06-10 DIAGNOSIS — R69 Illness, unspecified: Secondary | ICD-10-CM | POA: Diagnosis not present

## 2018-06-13 DIAGNOSIS — E559 Vitamin D deficiency, unspecified: Secondary | ICD-10-CM | POA: Diagnosis not present

## 2018-06-13 DIAGNOSIS — R69 Illness, unspecified: Secondary | ICD-10-CM | POA: Diagnosis not present

## 2018-06-13 DIAGNOSIS — Z9181 History of falling: Secondary | ICD-10-CM | POA: Diagnosis not present

## 2018-06-13 DIAGNOSIS — R202 Paresthesia of skin: Secondary | ICD-10-CM | POA: Diagnosis not present

## 2018-06-13 DIAGNOSIS — N189 Chronic kidney disease, unspecified: Secondary | ICD-10-CM | POA: Diagnosis not present

## 2018-06-13 DIAGNOSIS — G8929 Other chronic pain: Secondary | ICD-10-CM | POA: Diagnosis not present

## 2018-06-13 DIAGNOSIS — K219 Gastro-esophageal reflux disease without esophagitis: Secondary | ICD-10-CM | POA: Diagnosis not present

## 2018-06-13 DIAGNOSIS — M549 Dorsalgia, unspecified: Secondary | ICD-10-CM | POA: Diagnosis not present

## 2018-06-13 DIAGNOSIS — G479 Sleep disorder, unspecified: Secondary | ICD-10-CM | POA: Diagnosis not present

## 2018-06-13 DIAGNOSIS — G309 Alzheimer's disease, unspecified: Secondary | ICD-10-CM | POA: Diagnosis not present

## 2018-06-13 DIAGNOSIS — G250 Essential tremor: Secondary | ICD-10-CM | POA: Diagnosis not present

## 2018-06-13 DIAGNOSIS — I129 Hypertensive chronic kidney disease with stage 1 through stage 4 chronic kidney disease, or unspecified chronic kidney disease: Secondary | ICD-10-CM | POA: Diagnosis not present

## 2018-06-14 DIAGNOSIS — G309 Alzheimer's disease, unspecified: Secondary | ICD-10-CM | POA: Diagnosis not present

## 2018-06-14 DIAGNOSIS — I129 Hypertensive chronic kidney disease with stage 1 through stage 4 chronic kidney disease, or unspecified chronic kidney disease: Secondary | ICD-10-CM | POA: Diagnosis not present

## 2018-06-14 DIAGNOSIS — K219 Gastro-esophageal reflux disease without esophagitis: Secondary | ICD-10-CM | POA: Diagnosis not present

## 2018-06-14 DIAGNOSIS — Z9181 History of falling: Secondary | ICD-10-CM | POA: Diagnosis not present

## 2018-06-14 DIAGNOSIS — E559 Vitamin D deficiency, unspecified: Secondary | ICD-10-CM | POA: Diagnosis not present

## 2018-06-14 DIAGNOSIS — G8929 Other chronic pain: Secondary | ICD-10-CM | POA: Diagnosis not present

## 2018-06-14 DIAGNOSIS — R202 Paresthesia of skin: Secondary | ICD-10-CM | POA: Diagnosis not present

## 2018-06-14 DIAGNOSIS — R69 Illness, unspecified: Secondary | ICD-10-CM | POA: Diagnosis not present

## 2018-06-14 DIAGNOSIS — G250 Essential tremor: Secondary | ICD-10-CM | POA: Diagnosis not present

## 2018-06-14 DIAGNOSIS — M549 Dorsalgia, unspecified: Secondary | ICD-10-CM | POA: Diagnosis not present

## 2018-06-15 ENCOUNTER — Other Ambulatory Visit: Payer: Self-pay

## 2018-06-15 ENCOUNTER — Encounter: Payer: Self-pay | Admitting: Cardiology

## 2018-06-15 ENCOUNTER — Ambulatory Visit (INDEPENDENT_AMBULATORY_CARE_PROVIDER_SITE_OTHER): Payer: Medicare HMO | Admitting: Cardiology

## 2018-06-15 VITALS — BP 142/84 | HR 56 | Temp 96.1°F | Ht 62.0 in | Wt 164.0 lb

## 2018-06-15 DIAGNOSIS — R079 Chest pain, unspecified: Secondary | ICD-10-CM

## 2018-06-15 DIAGNOSIS — E782 Mixed hyperlipidemia: Secondary | ICD-10-CM

## 2018-06-15 DIAGNOSIS — I129 Hypertensive chronic kidney disease with stage 1 through stage 4 chronic kidney disease, or unspecified chronic kidney disease: Secondary | ICD-10-CM | POA: Diagnosis not present

## 2018-06-15 DIAGNOSIS — N183 Chronic kidney disease, stage 3 unspecified: Secondary | ICD-10-CM

## 2018-06-15 DIAGNOSIS — Z01812 Encounter for preprocedural laboratory examination: Secondary | ICD-10-CM

## 2018-06-15 DIAGNOSIS — I209 Angina pectoris, unspecified: Secondary | ICD-10-CM | POA: Diagnosis not present

## 2018-06-15 NOTE — Progress Notes (Signed)
Office visit  Date:  06/15/2018   ID:  Sharon Wise 06-15-1950, MRN [id number]    PCP:  Sharon Iba, NP  Cardiologist:  Sharon More, MD  Electrophysiologist:  None   Evaluation Performed:  Follow-Up Visit  Chief Complaint:  For chest pain, needs a cardiac CTA  History of Present Illness:    Sharon Wise is a 68 y.o. female with  hypertension stage III CKD and previous chest pain with a normal pharmacologic myocardial perfusion study performed in August 2019  last seen 05/13/18 for chest pain after [facility] ED visit 05/11/18.  She was referred for further evaluation after the ED visit.  I reviewed extensive records including cardiology [facility] previous myocardial perfusion report records from neurology [facility] ED records including review of chest x-ray and independent review of EKG.  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  When seen by me I thought she was having typical angina and with previous normal myocardial perfusion study advised her to undergo cardiac CTA once we resume testing as outpatients during COVID-19.  She continues to have a chest pain pattern.  With and without activity several times a week she has substernal pressure relieved with rest and has been taking nitroglycerin about 2 times a week with relief.  In view of her chronic chest pain pattern suspected angina pectoris and normal previous myocardial perfusion study will plan to do cardiac CTA I will wait till just prior to the procedure check renal function and she potentially may need hydration if GFR is reduced  The patient does not have symptoms concerning for COVID-19 infection (fever, chills, cough, or new shortness of breath).    Past Medical History:  Diagnosis Date  . Arthritis   . Chronic kidney disease   . Hypertension   . Tremor    Past Surgical History:  Procedure Laterality Date  . CARPAL TUNNEL RELEASE     . FOOT SURGERY    . JOINT REPLACEMENT    . REPLACEMENT UNICONDYLAR JOINT KNEE    . VAGINAL HYSTERECTOMY       Current Meds  Medication Sig  . acetaminophen (TYLENOL) 650 MG CR tablet Take 650 mg by mouth every 8 (eight) hours as needed.   Marland Kitchen ADVAIR HFA 230-21 MCG/ACT inhaler Inhale 1 puff into the lungs every 12 (twelve) hours.  Marland Kitchen amLODipine (NORVASC) 10 MG tablet Take 10 mg by mouth daily.  Marland Kitchen aspirin EC 81 MG tablet Take 81 mg by mouth daily.  . citalopram (CELEXA) 20 MG tablet Take 20 mg by mouth daily.  . clonazePAM (KLONOPIN) 0.5 MG tablet Take 0.5 mg by mouth 2 (two) times daily.   Marland Kitchen donepezil (ARICEPT ODT) 10 MG disintegrating tablet Take 10 mg by mouth daily.  . famotidine (PEPCID) 40 MG tablet Take 40 mg by mouth daily.  . isosorbide mononitrate (IMDUR) 30 MG 24 hr tablet Take 30 mg by mouth daily.  . memantine (NAMENDA) 5 MG tablet Take 5 mg by mouth 2 (two) times daily.  . metoprolol tartrate (LOPRESSOR) 25 MG tablet Take 1 tablet (25 mg total) by mouth 2 (two) times daily.  . nitroGLYCERIN (NITROSTAT) 0.4 MG SL tablet Place 1 tablet (0.4 mg total) under the tongue every 5 (five) minutes as needed for up to 30 days for chest pain.  Marland Kitchen omeprazole (PRILOSEC) 20 MG capsule Take 20 mg by mouth daily.  Marland Kitchen oxybutynin (DITROPAN-XL) 5 MG 24 hr  tablet Take 5 mg by mouth daily.  . polyethylene glycol (MIRALAX / GLYCOLAX) 17 g packet Take 17 g by mouth daily as needed.  . potassium chloride SA (K-DUR) 20 MEQ tablet Take 20 mEq by mouth every evening.  . pravastatin (PRAVACHOL) 40 MG tablet Take 20 mg by mouth daily.   . primidone (MYSOLINE) 50 MG tablet Take 50 mg by mouth 2 (two) times a day.   Marland Kitchen rOPINIRole (REQUIP) 0.25 MG tablet Take 0.25 mg by mouth 2 (two) times a day.      Allergies:   Meloxicam   Social History   Tobacco Use  . Smoking status: Never Smoker  . Smokeless tobacco: Never Used  Substance Use Topics  . Alcohol use: Never    Frequency: Never  . Drug use: Never      Family Hx: The patient's family history includes Cancer in her sister and sister; Heart disease in her father and mother; Hypertension in her mother.  ROS:   Please see the history of present illness.     All other systems reviewed and are negative.   Prior CV studies:   The following studies were reviewed today:    Labs/Other Tests and Data Reviewed:    EKG:  An ECG dated 05/11/18 was personally reviewed today and demonstrated:  Sinus rhythm first-degree AV block artifact was noted  Recent Labs: No results found for requested labs within last 8760 hours.   Recent Lipid Panel Lab Results  Component Value Date/Time   CHOL  05/01/2008 08:10 AM    155        ATP III CLASSIFICATION:  <200     mg/dL   Desirable  200-239  mg/dL   Borderline High  >=240    mg/dL   High          TRIG 115 05/01/2008 06:26 PM   HDL 55 05/01/2008 08:10 AM   CHOLHDL 2.8 05/01/2008 08:10 AM   LDLCALC  05/01/2008 08:10 AM    78        Total Cholesterol/HDL:CHD Risk Coronary Heart Disease Risk Table                     Men   Women  1/2 Average Risk   3.4   3.3  Average Risk       5.0   4.4  2 X Average Risk   9.6   7.1  3 X Average Risk  23.4   11.0        Use the calculated Patient Ratio above and the CHD Risk Table to determine the patient's CHD Risk.        ATP III CLASSIFICATION (LDL):  <100     mg/dL   Optimal  100-129  mg/dL   Near or Above                    Optimal  130-159  mg/dL   Borderline  160-189  mg/dL   High  >190     mg/dL   Very High    Wt Readings from Last 3 Encounters:  06/15/18 164 lb (74.4 kg)     Objective:    Vital Signs:  BP (!) 142/84 (BP Location: Right Arm, Patient Position: Sitting, Cuff Size: Normal)   Pulse (!) 56   Temp (!) 96.1 F (35.6 C)   Ht 5\' 2"  (1.575 m)   Wt 164 lb (74.4 kg)   SpO2 96%   BMI  30.00 kg/m    VITAL SIGNS:  reviewed GEN:  no acute distress EYES:  sclerae anicteric, EOMI - Extraocular Movements Intact RESPIRATORY:   normal respiratory effort, symmetric expansion CARDIOVASCULAR:  no peripheral edema SKIN:  no rash, lesions or ulcers. MUSCULOSKELETAL:  no obvious deformities. NEURO:  alert and oriented x 3, no obvious focal deficit PSYCH:  normal affect  ASSESSMENT & PLAN:    1. Chest pain plan cardiac CTA if severe flow-limiting stenosis would benefit from revascularization.  We discussed the option of coronary angiography however her chest pain pattern is stable and she has grave responsibilities of caring for her impaired brother and would avoid unless she requires revascularization 2. Hypertension stable continue current treatment we will recheck renal function prior to CTA 3. Hyperlipidemia stable continue her statin 4. Angina continue appropriate medical therapy aspirin beta-blocker oral nitrates statin await results of CTA regarding need for revascularization  COVID-19 Education: The signs and symptoms of COVID-19 were discussed with the patient and how to seek care for testing (follow up with PCP or arrange E-visit).  The importance of social distancing was discussed today.  Time:   Today, I have spent 28 minutes with the patient with telehealth technology discussing the above problems.     Medication Adjustments/Labs and Tests Ordered: Current medicines are reviewed at length with the patient today.  Concerns regarding medicines are outlined above.   Tests Ordered: Orders Placed This Encounter  Procedures  . CT CORONARY FRACTIONAL FLOW RESERVE DATA PREP  . CT CORONARY FRACTIONAL FLOW RESERVE FLUID ANALYSIS  . CT CORONARY MORPH W/CTA COR W/SCORE W/CA W/CM &/OR WO/CM  . Basic Metabolic Panel (BMET)    Medication Changes: No orders of the defined types were placed in this encounter.   Disposition:  Follow up in 3 month(s)  Signed, Sharon More, MD  06/15/2018 10:36 AM    [facility]

## 2018-06-15 NOTE — Patient Instructions (Addendum)
Medication Instructions:  Your physician recommends that you continue on your current medications as directed. Please refer to the Current Medication list given to you today.  If you need a refill on your cardiac medications before your next appointment, please call your pharmacy.   Lab work: Your physician recommends that you return for lab work within 3-7 days before your cardiac CTA: BMP. Please return to our [place] office for lab work, no appointment needed. No need to fast beforehand.   If you have labs (blood work) drawn today and your tests are completely normal, you will receive your results only by: Marland Kitchen MyChart Message (if you have MyChart) OR . A paper copy in the mail If you have any lab test that is abnormal or we need to change your treatment, we will call you to review the results.  Testing/Procedures: Your physician has requested that you have cardiac CT. Cardiac computed tomography (CT) is a painless test that uses an x-ray machine to take clear, detailed pictures of your heart. For further information please visit [employer]ugeFiesta[employer]tn. Please follow instruction sheet as given.   Please arrive at the [facility] main entrance of [facility] at xx:xx AM (30-45 minutes prior to test start time)  [facility] [place], [place] [id number] [phone]  Proceed to the [facility] Radiology Department (First Floor).  Please follow these instructions carefully (unless otherwise directed):  Hold all erectile dysfunction medications at least 48 hours prior to test.  On the Night Before the Test: . Be sure to Drink plenty of water. . Do not consume any caffeinated/decaffeinated beverages or chocolate 12 hours prior to your test. . Do not take any antihistamines 12 hours prior to your test.  On the Day of the Test: . Drink plenty of water. Do not drink any water within one hour of the test. . Do not eat any food 4 hours prior to the  test. . You may take your regular medications prior to the test.  . Take metoprolol (Lopressor) two hours prior to test.                   -If HR is less than 55 BPM- No Beta Blocker                -IF HR is greater than 55 BPM and patient is less than or equal to 19 yrs old Lopressor 100mg  x1.                    After the Test: . Drink plenty of water. . After receiving IV contrast, you may experience a mild flushed feeling. This is normal. . On occasion, you may experience a mild rash up to 24 hours after the test. This is not dangerous. If this occurs, you can take Benadryl 25 mg and increase your fluid intake. . If you experience trouble breathing, this can be serious. If it is severe call 911 IMMEDIATELY. If it is mild, please call our office.  Follow-Up: At [facility], you and your health needs are our priority.  As part of our continuing mission to provide you with exceptional heart care, we have created designated Provider Care Teams.  These Care Teams include your primary Cardiologist (physician) and Advanced Practice Providers (APPs -  Physician Assistants and Nurse Practitioners) who all work together to provide you with the care you need, when you need it. You will need a follow up appointment in  3 months.        Cardiac CT Angiogram  A cardiac CT angiogram is a procedure to look at the heart and the area around the heart. It may be done to help find the cause of chest pains or other symptoms of heart disease. During this procedure, a large X-ray machine, called a CT scanner, takes detailed pictures of the heart and the surrounding area after a dye (contrast material) has been injected into blood vessels in the area. The procedure is also sometimes called a coronary CT angiogram, coronary artery scanning, or CTA. A cardiac CT angiogram allows the health care provider to see how well blood is flowing to and from the heart. The health care provider will be able to see if  there are any problems, such as:  Blockage or narrowing of the coronary arteries in the heart.  Fluid around the heart.  Signs of weakness or disease in the muscles, valves, and tissues of the heart. Tell a health care provider about:  Any allergies you have. This is especially important if you have had a previous allergic reaction to contrast dye.  All medicines you are taking, including vitamins, herbs, eye drops, creams, and over-the-counter medicines.  Any blood disorders you have.  Any surgeries you have had.  Any medical conditions you have.  Whether you are pregnant or may be pregnant.  Any anxiety disorders, chronic pain, or other conditions you have that may increase your stress or prevent you from lying still. What are the risks? Generally, this is a safe procedure. However, problems may occur, including:  Bleeding.  Infection.  Allergic reactions to medicines or dyes.  Damage to other structures or organs.  Kidney damage from the dye or contrast that is used.  Increased risk of cancer from radiation exposure. This risk is low. Talk with your health care provider about: ? The risks and benefits of testing. ? How you can receive the lowest dose of radiation. What happens before the procedure?  Wear comfortable clothing and remove any jewelry, glasses, dentures, and hearing aids.  Follow instructions from your health care provider about eating and drinking. This may include: ? For 12 hours before the test - avoid caffeine. This includes tea, coffee, soda, energy drinks, and diet pills. Drink plenty of water or other fluids that do not have caffeine in them. Being well-hydrated can prevent complications. ? For 4-6 hours before the test - stop eating and drinking. The contrast dye can cause nausea, but this is less likely if your stomach is empty.  Ask your health care provider about changing or stopping your regular medicines. This is especially important if you  are taking diabetes medicines, blood thinners, or medicines to treat erectile dysfunction. What happens during the procedure?  Hair on your chest may need to be removed so that small sticky patches called electrodes can be placed on your chest. These will transmit information that helps to monitor your heart during the test.  An IV tube will be inserted into one of your veins.  You might be given a medicine to control your heart rate during the test. This will help to ensure that good images are obtained.  You will be asked to lie on an exam table. This table will slide in and out of the CT machine during the procedure.  Contrast dye will be injected into the IV tube. You might feel warm, or you may get a metallic taste in your mouth.  You will  be given a medicine (nitroglycerin) to relax (dilate) the arteries in your heart.  The table that you are lying on will move into the CT machine tunnel for the scan.  The person running the machine will give you instructions while the scans are being done. You may be asked to: ? Keep your arms above your head. ? Hold your breath. ? Stay very still, even if the table is moving.  When the scanning is complete, you will be moved out of the machine.  The IV tube will be removed. The procedure may vary among health care providers and hospitals. What happens after the procedure?  You might feel warm, or you may get a metallic taste in your mouth from the contrast dye.  You may have a headache from the nitroglycerin.  After the procedure, drink water or other fluids to wash (flush) the contrast material out of your body.  Contact a health care provider if you have any symptoms of allergy to the contrast. These symptoms include: ? Shortness of breath. ? Rash or hives. ? A racing heartbeat.  Most people can return to their normal activities right after the procedure. Ask your health care provider what activities are safe for you.  It is up to  you to get the results of your procedure. Ask your health care provider, or the department that is doing the procedure, when your results will be ready. Summary  A cardiac CT angiogram is a procedure to look at the heart and the area around the heart. It may be done to help find the cause of chest pains or other symptoms of heart disease.  During this procedure, a large X-ray machine, called a CT scanner, takes detailed pictures of the heart and the surrounding area after a dye (contrast material) has been injected into blood vessels in the area.  Ask your health care provider about changing or stopping your regular medicines before the procedure. This is especially important if you are taking diabetes medicines, blood thinners, or medicines to treat erectile dysfunction.  After the procedure, drink water or other fluids to wash (flush) the contrast material out of your body. This information is not intended to replace advice given to you by your health care provider. Make sure you discuss any questions you have with your health care provider. Document Released: 12/19/2007 Document Revised: 11/25/2015 Document Reviewed: 11/25/2015 Elsevier Interactive Patient Education  2019 [employer].

## 2018-06-16 ENCOUNTER — Encounter: Payer: Self-pay | Admitting: Cardiology

## 2018-06-16 DIAGNOSIS — I129 Hypertensive chronic kidney disease with stage 1 through stage 4 chronic kidney disease, or unspecified chronic kidney disease: Secondary | ICD-10-CM | POA: Diagnosis not present

## 2018-06-16 DIAGNOSIS — G479 Sleep disorder, unspecified: Secondary | ICD-10-CM | POA: Diagnosis not present

## 2018-06-16 DIAGNOSIS — G8929 Other chronic pain: Secondary | ICD-10-CM | POA: Diagnosis not present

## 2018-06-16 DIAGNOSIS — K219 Gastro-esophageal reflux disease without esophagitis: Secondary | ICD-10-CM | POA: Diagnosis not present

## 2018-06-16 DIAGNOSIS — E559 Vitamin D deficiency, unspecified: Secondary | ICD-10-CM | POA: Diagnosis not present

## 2018-06-16 DIAGNOSIS — R531 Weakness: Secondary | ICD-10-CM | POA: Diagnosis not present

## 2018-06-16 DIAGNOSIS — R634 Abnormal weight loss: Secondary | ICD-10-CM | POA: Diagnosis not present

## 2018-06-16 DIAGNOSIS — R109 Unspecified abdominal pain: Secondary | ICD-10-CM | POA: Diagnosis not present

## 2018-06-16 DIAGNOSIS — M549 Dorsalgia, unspecified: Secondary | ICD-10-CM | POA: Diagnosis not present

## 2018-06-16 DIAGNOSIS — Z9181 History of falling: Secondary | ICD-10-CM | POA: Diagnosis not present

## 2018-06-16 DIAGNOSIS — R0602 Shortness of breath: Secondary | ICD-10-CM | POA: Diagnosis not present

## 2018-06-16 DIAGNOSIS — N189 Chronic kidney disease, unspecified: Secondary | ICD-10-CM | POA: Diagnosis not present

## 2018-06-16 DIAGNOSIS — G250 Essential tremor: Secondary | ICD-10-CM | POA: Diagnosis not present

## 2018-06-16 DIAGNOSIS — K861 Other chronic pancreatitis: Secondary | ICD-10-CM | POA: Diagnosis not present

## 2018-06-16 DIAGNOSIS — R69 Illness, unspecified: Secondary | ICD-10-CM | POA: Diagnosis not present

## 2018-06-16 DIAGNOSIS — G309 Alzheimer's disease, unspecified: Secondary | ICD-10-CM | POA: Diagnosis not present

## 2018-06-16 DIAGNOSIS — R29898 Other symptoms and signs involving the musculoskeletal system: Secondary | ICD-10-CM | POA: Diagnosis not present

## 2018-06-16 DIAGNOSIS — D649 Anemia, unspecified: Secondary | ICD-10-CM | POA: Diagnosis not present

## 2018-06-16 DIAGNOSIS — R202 Paresthesia of skin: Secondary | ICD-10-CM | POA: Diagnosis not present

## 2018-06-17 ENCOUNTER — Telehealth: Payer: Self-pay | Admitting: Cardiology

## 2018-06-17 ENCOUNTER — Encounter: Payer: Self-pay | Admitting: Cardiology

## 2018-06-17 DIAGNOSIS — Z9181 History of falling: Secondary | ICD-10-CM | POA: Diagnosis not present

## 2018-06-17 DIAGNOSIS — M549 Dorsalgia, unspecified: Secondary | ICD-10-CM | POA: Diagnosis not present

## 2018-06-17 DIAGNOSIS — K219 Gastro-esophageal reflux disease without esophagitis: Secondary | ICD-10-CM | POA: Diagnosis not present

## 2018-06-17 DIAGNOSIS — E559 Vitamin D deficiency, unspecified: Secondary | ICD-10-CM | POA: Diagnosis not present

## 2018-06-17 DIAGNOSIS — G250 Essential tremor: Secondary | ICD-10-CM | POA: Diagnosis not present

## 2018-06-17 DIAGNOSIS — G8929 Other chronic pain: Secondary | ICD-10-CM | POA: Diagnosis not present

## 2018-06-17 DIAGNOSIS — I129 Hypertensive chronic kidney disease with stage 1 through stage 4 chronic kidney disease, or unspecified chronic kidney disease: Secondary | ICD-10-CM | POA: Diagnosis not present

## 2018-06-17 DIAGNOSIS — G309 Alzheimer's disease, unspecified: Secondary | ICD-10-CM | POA: Diagnosis not present

## 2018-06-17 DIAGNOSIS — N189 Chronic kidney disease, unspecified: Secondary | ICD-10-CM | POA: Diagnosis not present

## 2018-06-17 DIAGNOSIS — R531 Weakness: Secondary | ICD-10-CM | POA: Diagnosis not present

## 2018-06-17 DIAGNOSIS — R69 Illness, unspecified: Secondary | ICD-10-CM | POA: Diagnosis not present

## 2018-06-17 DIAGNOSIS — R202 Paresthesia of skin: Secondary | ICD-10-CM | POA: Diagnosis not present

## 2018-06-17 DIAGNOSIS — G479 Sleep disorder, unspecified: Secondary | ICD-10-CM | POA: Diagnosis not present

## 2018-06-17 NOTE — Telephone Encounter (Signed)
Needs to discuss some problems the pt is having and wants an appt with [facility] next week

## 2018-06-17 NOTE — Telephone Encounter (Signed)
Patient states that Ihor Dow, [place] sent her to [facility] to have CT chest and CT abd-pelvis yesterday.  She also had labs done at the office.  Patient states she has been feeling bad and had some shortness of breath.  Spoke with Theadora Rama at [place] office and she will get the results back of the lab work tomorrow and fax them to our office on Monday.    Dr Bettina Gavia has reviewed testing from [facility] and advised that patient was just seen on 06-15-2018 and Cardiac CTA was ordered.  Copy of last office visit sent to [facility] for her review.  Patient advised that Dr Bettina Gavia has reviewed testing and does not feel she needs an office visit at this time as she was just seen and that he plans to continue with treatment plan as discussed.  Patient agreed to plan and verbalized understanding.

## 2018-06-21 DIAGNOSIS — N189 Chronic kidney disease, unspecified: Secondary | ICD-10-CM | POA: Diagnosis not present

## 2018-06-21 DIAGNOSIS — Z9181 History of falling: Secondary | ICD-10-CM | POA: Diagnosis not present

## 2018-06-21 DIAGNOSIS — I129 Hypertensive chronic kidney disease with stage 1 through stage 4 chronic kidney disease, or unspecified chronic kidney disease: Secondary | ICD-10-CM | POA: Diagnosis not present

## 2018-06-21 DIAGNOSIS — M549 Dorsalgia, unspecified: Secondary | ICD-10-CM | POA: Diagnosis not present

## 2018-06-21 DIAGNOSIS — G309 Alzheimer's disease, unspecified: Secondary | ICD-10-CM | POA: Diagnosis not present

## 2018-06-21 DIAGNOSIS — R69 Illness, unspecified: Secondary | ICD-10-CM | POA: Diagnosis not present

## 2018-06-21 DIAGNOSIS — K219 Gastro-esophageal reflux disease without esophagitis: Secondary | ICD-10-CM | POA: Diagnosis not present

## 2018-06-21 DIAGNOSIS — G250 Essential tremor: Secondary | ICD-10-CM | POA: Diagnosis not present

## 2018-06-21 DIAGNOSIS — G8929 Other chronic pain: Secondary | ICD-10-CM | POA: Diagnosis not present

## 2018-06-21 DIAGNOSIS — G479 Sleep disorder, unspecified: Secondary | ICD-10-CM | POA: Diagnosis not present

## 2018-06-21 DIAGNOSIS — R202 Paresthesia of skin: Secondary | ICD-10-CM | POA: Diagnosis not present

## 2018-06-21 DIAGNOSIS — E559 Vitamin D deficiency, unspecified: Secondary | ICD-10-CM | POA: Diagnosis not present

## 2018-06-22 DIAGNOSIS — I129 Hypertensive chronic kidney disease with stage 1 through stage 4 chronic kidney disease, or unspecified chronic kidney disease: Secondary | ICD-10-CM | POA: Diagnosis not present

## 2018-06-22 DIAGNOSIS — R202 Paresthesia of skin: Secondary | ICD-10-CM | POA: Diagnosis not present

## 2018-06-22 DIAGNOSIS — R69 Illness, unspecified: Secondary | ICD-10-CM | POA: Diagnosis not present

## 2018-06-22 DIAGNOSIS — G309 Alzheimer's disease, unspecified: Secondary | ICD-10-CM | POA: Diagnosis not present

## 2018-06-22 DIAGNOSIS — K219 Gastro-esophageal reflux disease without esophagitis: Secondary | ICD-10-CM | POA: Diagnosis not present

## 2018-06-22 DIAGNOSIS — G479 Sleep disorder, unspecified: Secondary | ICD-10-CM | POA: Diagnosis not present

## 2018-06-22 DIAGNOSIS — G250 Essential tremor: Secondary | ICD-10-CM | POA: Diagnosis not present

## 2018-06-22 DIAGNOSIS — E559 Vitamin D deficiency, unspecified: Secondary | ICD-10-CM | POA: Diagnosis not present

## 2018-06-22 DIAGNOSIS — G8929 Other chronic pain: Secondary | ICD-10-CM | POA: Diagnosis not present

## 2018-06-22 DIAGNOSIS — M549 Dorsalgia, unspecified: Secondary | ICD-10-CM | POA: Diagnosis not present

## 2018-06-22 DIAGNOSIS — Z9181 History of falling: Secondary | ICD-10-CM | POA: Diagnosis not present

## 2018-06-22 DIAGNOSIS — N189 Chronic kidney disease, unspecified: Secondary | ICD-10-CM | POA: Diagnosis not present

## 2018-06-23 DIAGNOSIS — R202 Paresthesia of skin: Secondary | ICD-10-CM | POA: Diagnosis not present

## 2018-06-23 DIAGNOSIS — I129 Hypertensive chronic kidney disease with stage 1 through stage 4 chronic kidney disease, or unspecified chronic kidney disease: Secondary | ICD-10-CM | POA: Diagnosis not present

## 2018-06-23 DIAGNOSIS — M549 Dorsalgia, unspecified: Secondary | ICD-10-CM | POA: Diagnosis not present

## 2018-06-23 DIAGNOSIS — G309 Alzheimer's disease, unspecified: Secondary | ICD-10-CM | POA: Diagnosis not present

## 2018-06-23 DIAGNOSIS — K219 Gastro-esophageal reflux disease without esophagitis: Secondary | ICD-10-CM | POA: Diagnosis not present

## 2018-06-23 DIAGNOSIS — G8929 Other chronic pain: Secondary | ICD-10-CM | POA: Diagnosis not present

## 2018-06-23 DIAGNOSIS — G479 Sleep disorder, unspecified: Secondary | ICD-10-CM | POA: Diagnosis not present

## 2018-06-23 DIAGNOSIS — G250 Essential tremor: Secondary | ICD-10-CM | POA: Diagnosis not present

## 2018-06-23 DIAGNOSIS — Z9181 History of falling: Secondary | ICD-10-CM | POA: Diagnosis not present

## 2018-06-23 DIAGNOSIS — R69 Illness, unspecified: Secondary | ICD-10-CM | POA: Diagnosis not present

## 2018-06-23 DIAGNOSIS — N189 Chronic kidney disease, unspecified: Secondary | ICD-10-CM | POA: Diagnosis not present

## 2018-06-23 DIAGNOSIS — E559 Vitamin D deficiency, unspecified: Secondary | ICD-10-CM | POA: Diagnosis not present

## 2018-06-24 DIAGNOSIS — M549 Dorsalgia, unspecified: Secondary | ICD-10-CM | POA: Diagnosis not present

## 2018-06-24 DIAGNOSIS — E559 Vitamin D deficiency, unspecified: Secondary | ICD-10-CM | POA: Diagnosis not present

## 2018-06-24 DIAGNOSIS — N189 Chronic kidney disease, unspecified: Secondary | ICD-10-CM | POA: Diagnosis not present

## 2018-06-24 DIAGNOSIS — G8929 Other chronic pain: Secondary | ICD-10-CM | POA: Diagnosis not present

## 2018-06-24 DIAGNOSIS — G250 Essential tremor: Secondary | ICD-10-CM | POA: Diagnosis not present

## 2018-06-24 DIAGNOSIS — G479 Sleep disorder, unspecified: Secondary | ICD-10-CM | POA: Diagnosis not present

## 2018-06-24 DIAGNOSIS — G309 Alzheimer's disease, unspecified: Secondary | ICD-10-CM | POA: Diagnosis not present

## 2018-06-24 DIAGNOSIS — R69 Illness, unspecified: Secondary | ICD-10-CM | POA: Diagnosis not present

## 2018-06-24 DIAGNOSIS — I129 Hypertensive chronic kidney disease with stage 1 through stage 4 chronic kidney disease, or unspecified chronic kidney disease: Secondary | ICD-10-CM | POA: Diagnosis not present

## 2018-06-24 DIAGNOSIS — K219 Gastro-esophageal reflux disease without esophagitis: Secondary | ICD-10-CM | POA: Diagnosis not present

## 2018-06-24 DIAGNOSIS — Z9181 History of falling: Secondary | ICD-10-CM | POA: Diagnosis not present

## 2018-06-24 DIAGNOSIS — R202 Paresthesia of skin: Secondary | ICD-10-CM | POA: Diagnosis not present

## 2018-06-27 DIAGNOSIS — R202 Paresthesia of skin: Secondary | ICD-10-CM | POA: Diagnosis not present

## 2018-06-27 DIAGNOSIS — N189 Chronic kidney disease, unspecified: Secondary | ICD-10-CM | POA: Diagnosis not present

## 2018-06-27 DIAGNOSIS — I129 Hypertensive chronic kidney disease with stage 1 through stage 4 chronic kidney disease, or unspecified chronic kidney disease: Secondary | ICD-10-CM | POA: Diagnosis not present

## 2018-06-27 DIAGNOSIS — G250 Essential tremor: Secondary | ICD-10-CM | POA: Diagnosis not present

## 2018-06-27 DIAGNOSIS — R69 Illness, unspecified: Secondary | ICD-10-CM | POA: Diagnosis not present

## 2018-06-27 DIAGNOSIS — G479 Sleep disorder, unspecified: Secondary | ICD-10-CM | POA: Diagnosis not present

## 2018-06-27 DIAGNOSIS — K219 Gastro-esophageal reflux disease without esophagitis: Secondary | ICD-10-CM | POA: Diagnosis not present

## 2018-06-27 DIAGNOSIS — R195 Other fecal abnormalities: Secondary | ICD-10-CM | POA: Diagnosis not present

## 2018-06-27 DIAGNOSIS — E559 Vitamin D deficiency, unspecified: Secondary | ICD-10-CM | POA: Diagnosis not present

## 2018-06-27 DIAGNOSIS — Z9181 History of falling: Secondary | ICD-10-CM | POA: Diagnosis not present

## 2018-06-27 DIAGNOSIS — M549 Dorsalgia, unspecified: Secondary | ICD-10-CM | POA: Diagnosis not present

## 2018-06-27 DIAGNOSIS — G8929 Other chronic pain: Secondary | ICD-10-CM | POA: Diagnosis not present

## 2018-06-27 DIAGNOSIS — G309 Alzheimer's disease, unspecified: Secondary | ICD-10-CM | POA: Diagnosis not present

## 2018-06-28 DIAGNOSIS — N189 Chronic kidney disease, unspecified: Secondary | ICD-10-CM | POA: Diagnosis not present

## 2018-06-28 DIAGNOSIS — G250 Essential tremor: Secondary | ICD-10-CM | POA: Diagnosis not present

## 2018-06-28 DIAGNOSIS — Z9181 History of falling: Secondary | ICD-10-CM | POA: Diagnosis not present

## 2018-06-28 DIAGNOSIS — G309 Alzheimer's disease, unspecified: Secondary | ICD-10-CM | POA: Diagnosis not present

## 2018-06-28 DIAGNOSIS — E559 Vitamin D deficiency, unspecified: Secondary | ICD-10-CM | POA: Diagnosis not present

## 2018-06-28 DIAGNOSIS — M549 Dorsalgia, unspecified: Secondary | ICD-10-CM | POA: Diagnosis not present

## 2018-06-28 DIAGNOSIS — G479 Sleep disorder, unspecified: Secondary | ICD-10-CM | POA: Diagnosis not present

## 2018-06-28 DIAGNOSIS — K219 Gastro-esophageal reflux disease without esophagitis: Secondary | ICD-10-CM | POA: Diagnosis not present

## 2018-06-28 DIAGNOSIS — G8929 Other chronic pain: Secondary | ICD-10-CM | POA: Diagnosis not present

## 2018-06-28 DIAGNOSIS — I129 Hypertensive chronic kidney disease with stage 1 through stage 4 chronic kidney disease, or unspecified chronic kidney disease: Secondary | ICD-10-CM | POA: Diagnosis not present

## 2018-06-28 DIAGNOSIS — R69 Illness, unspecified: Secondary | ICD-10-CM | POA: Diagnosis not present

## 2018-06-28 DIAGNOSIS — R202 Paresthesia of skin: Secondary | ICD-10-CM | POA: Diagnosis not present

## 2018-06-29 DIAGNOSIS — M549 Dorsalgia, unspecified: Secondary | ICD-10-CM | POA: Diagnosis not present

## 2018-06-29 DIAGNOSIS — G479 Sleep disorder, unspecified: Secondary | ICD-10-CM | POA: Diagnosis not present

## 2018-06-29 DIAGNOSIS — I129 Hypertensive chronic kidney disease with stage 1 through stage 4 chronic kidney disease, or unspecified chronic kidney disease: Secondary | ICD-10-CM | POA: Diagnosis not present

## 2018-06-29 DIAGNOSIS — R69 Illness, unspecified: Secondary | ICD-10-CM | POA: Diagnosis not present

## 2018-06-29 DIAGNOSIS — E559 Vitamin D deficiency, unspecified: Secondary | ICD-10-CM | POA: Diagnosis not present

## 2018-06-29 DIAGNOSIS — N189 Chronic kidney disease, unspecified: Secondary | ICD-10-CM | POA: Diagnosis not present

## 2018-06-29 DIAGNOSIS — R202 Paresthesia of skin: Secondary | ICD-10-CM | POA: Diagnosis not present

## 2018-06-29 DIAGNOSIS — G309 Alzheimer's disease, unspecified: Secondary | ICD-10-CM | POA: Diagnosis not present

## 2018-06-29 DIAGNOSIS — Z9181 History of falling: Secondary | ICD-10-CM | POA: Diagnosis not present

## 2018-06-29 DIAGNOSIS — K219 Gastro-esophageal reflux disease without esophagitis: Secondary | ICD-10-CM | POA: Diagnosis not present

## 2018-06-29 DIAGNOSIS — G250 Essential tremor: Secondary | ICD-10-CM | POA: Diagnosis not present

## 2018-06-29 DIAGNOSIS — G8929 Other chronic pain: Secondary | ICD-10-CM | POA: Diagnosis not present

## 2018-07-04 DIAGNOSIS — N189 Chronic kidney disease, unspecified: Secondary | ICD-10-CM | POA: Diagnosis not present

## 2018-07-04 DIAGNOSIS — E559 Vitamin D deficiency, unspecified: Secondary | ICD-10-CM | POA: Diagnosis not present

## 2018-07-04 DIAGNOSIS — G309 Alzheimer's disease, unspecified: Secondary | ICD-10-CM | POA: Diagnosis not present

## 2018-07-04 DIAGNOSIS — K219 Gastro-esophageal reflux disease without esophagitis: Secondary | ICD-10-CM | POA: Diagnosis not present

## 2018-07-04 DIAGNOSIS — G250 Essential tremor: Secondary | ICD-10-CM | POA: Diagnosis not present

## 2018-07-04 DIAGNOSIS — R69 Illness, unspecified: Secondary | ICD-10-CM | POA: Diagnosis not present

## 2018-07-04 DIAGNOSIS — R202 Paresthesia of skin: Secondary | ICD-10-CM | POA: Diagnosis not present

## 2018-07-04 DIAGNOSIS — I129 Hypertensive chronic kidney disease with stage 1 through stage 4 chronic kidney disease, or unspecified chronic kidney disease: Secondary | ICD-10-CM | POA: Diagnosis not present

## 2018-07-04 DIAGNOSIS — M549 Dorsalgia, unspecified: Secondary | ICD-10-CM | POA: Diagnosis not present

## 2018-07-04 DIAGNOSIS — Z9181 History of falling: Secondary | ICD-10-CM | POA: Diagnosis not present

## 2018-07-04 DIAGNOSIS — G8929 Other chronic pain: Secondary | ICD-10-CM | POA: Diagnosis not present

## 2018-07-04 DIAGNOSIS — G479 Sleep disorder, unspecified: Secondary | ICD-10-CM | POA: Diagnosis not present

## 2018-07-05 DIAGNOSIS — G250 Essential tremor: Secondary | ICD-10-CM | POA: Diagnosis not present

## 2018-07-05 DIAGNOSIS — D649 Anemia, unspecified: Secondary | ICD-10-CM | POA: Diagnosis not present

## 2018-07-05 DIAGNOSIS — K219 Gastro-esophageal reflux disease without esophagitis: Secondary | ICD-10-CM | POA: Diagnosis not present

## 2018-07-05 DIAGNOSIS — G479 Sleep disorder, unspecified: Secondary | ICD-10-CM | POA: Diagnosis not present

## 2018-07-05 DIAGNOSIS — R69 Illness, unspecified: Secondary | ICD-10-CM | POA: Diagnosis not present

## 2018-07-05 DIAGNOSIS — R634 Abnormal weight loss: Secondary | ICD-10-CM | POA: Diagnosis not present

## 2018-07-05 DIAGNOSIS — G8929 Other chronic pain: Secondary | ICD-10-CM | POA: Diagnosis not present

## 2018-07-05 DIAGNOSIS — G309 Alzheimer's disease, unspecified: Secondary | ICD-10-CM | POA: Diagnosis not present

## 2018-07-05 DIAGNOSIS — M549 Dorsalgia, unspecified: Secondary | ICD-10-CM | POA: Diagnosis not present

## 2018-07-05 DIAGNOSIS — R0602 Shortness of breath: Secondary | ICD-10-CM | POA: Diagnosis not present

## 2018-07-05 DIAGNOSIS — Z9181 History of falling: Secondary | ICD-10-CM | POA: Diagnosis not present

## 2018-07-05 DIAGNOSIS — N189 Chronic kidney disease, unspecified: Secondary | ICD-10-CM | POA: Diagnosis not present

## 2018-07-05 DIAGNOSIS — I129 Hypertensive chronic kidney disease with stage 1 through stage 4 chronic kidney disease, or unspecified chronic kidney disease: Secondary | ICD-10-CM | POA: Diagnosis not present

## 2018-07-05 DIAGNOSIS — E559 Vitamin D deficiency, unspecified: Secondary | ICD-10-CM | POA: Diagnosis not present

## 2018-07-05 DIAGNOSIS — R109 Unspecified abdominal pain: Secondary | ICD-10-CM | POA: Diagnosis not present

## 2018-07-05 DIAGNOSIS — R29898 Other symptoms and signs involving the musculoskeletal system: Secondary | ICD-10-CM | POA: Diagnosis not present

## 2018-07-05 DIAGNOSIS — R202 Paresthesia of skin: Secondary | ICD-10-CM | POA: Diagnosis not present

## 2018-07-05 DIAGNOSIS — R531 Weakness: Secondary | ICD-10-CM | POA: Diagnosis not present

## 2018-07-06 DIAGNOSIS — E559 Vitamin D deficiency, unspecified: Secondary | ICD-10-CM | POA: Diagnosis not present

## 2018-07-06 DIAGNOSIS — Z9181 History of falling: Secondary | ICD-10-CM | POA: Diagnosis not present

## 2018-07-06 DIAGNOSIS — R202 Paresthesia of skin: Secondary | ICD-10-CM | POA: Diagnosis not present

## 2018-07-06 DIAGNOSIS — G250 Essential tremor: Secondary | ICD-10-CM | POA: Diagnosis not present

## 2018-07-06 DIAGNOSIS — G479 Sleep disorder, unspecified: Secondary | ICD-10-CM | POA: Diagnosis not present

## 2018-07-06 DIAGNOSIS — I129 Hypertensive chronic kidney disease with stage 1 through stage 4 chronic kidney disease, or unspecified chronic kidney disease: Secondary | ICD-10-CM | POA: Diagnosis not present

## 2018-07-06 DIAGNOSIS — G8929 Other chronic pain: Secondary | ICD-10-CM | POA: Diagnosis not present

## 2018-07-06 DIAGNOSIS — G309 Alzheimer's disease, unspecified: Secondary | ICD-10-CM | POA: Diagnosis not present

## 2018-07-06 DIAGNOSIS — M549 Dorsalgia, unspecified: Secondary | ICD-10-CM | POA: Diagnosis not present

## 2018-07-06 DIAGNOSIS — N189 Chronic kidney disease, unspecified: Secondary | ICD-10-CM | POA: Diagnosis not present

## 2018-07-06 DIAGNOSIS — K219 Gastro-esophageal reflux disease without esophagitis: Secondary | ICD-10-CM | POA: Diagnosis not present

## 2018-07-06 DIAGNOSIS — R69 Illness, unspecified: Secondary | ICD-10-CM | POA: Diagnosis not present

## 2018-07-08 DIAGNOSIS — R69 Illness, unspecified: Secondary | ICD-10-CM | POA: Diagnosis not present

## 2018-07-08 DIAGNOSIS — M545 Low back pain: Secondary | ICD-10-CM | POA: Diagnosis not present

## 2018-07-08 DIAGNOSIS — G252 Other specified forms of tremor: Secondary | ICD-10-CM | POA: Diagnosis not present

## 2018-07-08 DIAGNOSIS — M47816 Spondylosis without myelopathy or radiculopathy, lumbar region: Secondary | ICD-10-CM | POA: Diagnosis not present

## 2018-07-08 DIAGNOSIS — G301 Alzheimer's disease with late onset: Secondary | ICD-10-CM | POA: Diagnosis not present

## 2018-07-11 DIAGNOSIS — G250 Essential tremor: Secondary | ICD-10-CM | POA: Diagnosis not present

## 2018-07-11 DIAGNOSIS — R69 Illness, unspecified: Secondary | ICD-10-CM | POA: Diagnosis not present

## 2018-07-11 DIAGNOSIS — I129 Hypertensive chronic kidney disease with stage 1 through stage 4 chronic kidney disease, or unspecified chronic kidney disease: Secondary | ICD-10-CM | POA: Diagnosis not present

## 2018-07-11 DIAGNOSIS — R202 Paresthesia of skin: Secondary | ICD-10-CM | POA: Diagnosis not present

## 2018-07-11 DIAGNOSIS — M549 Dorsalgia, unspecified: Secondary | ICD-10-CM | POA: Diagnosis not present

## 2018-07-11 DIAGNOSIS — E559 Vitamin D deficiency, unspecified: Secondary | ICD-10-CM | POA: Diagnosis not present

## 2018-07-11 DIAGNOSIS — G309 Alzheimer's disease, unspecified: Secondary | ICD-10-CM | POA: Diagnosis not present

## 2018-07-11 DIAGNOSIS — N189 Chronic kidney disease, unspecified: Secondary | ICD-10-CM | POA: Diagnosis not present

## 2018-07-11 DIAGNOSIS — K219 Gastro-esophageal reflux disease without esophagitis: Secondary | ICD-10-CM | POA: Diagnosis not present

## 2018-07-11 DIAGNOSIS — G479 Sleep disorder, unspecified: Secondary | ICD-10-CM | POA: Diagnosis not present

## 2018-07-11 DIAGNOSIS — Z9181 History of falling: Secondary | ICD-10-CM | POA: Diagnosis not present

## 2018-07-11 DIAGNOSIS — G8929 Other chronic pain: Secondary | ICD-10-CM | POA: Diagnosis not present

## 2018-07-12 DIAGNOSIS — R69 Illness, unspecified: Secondary | ICD-10-CM | POA: Diagnosis not present

## 2018-07-12 DIAGNOSIS — K219 Gastro-esophageal reflux disease without esophagitis: Secondary | ICD-10-CM | POA: Diagnosis not present

## 2018-07-12 DIAGNOSIS — R202 Paresthesia of skin: Secondary | ICD-10-CM | POA: Diagnosis not present

## 2018-07-12 DIAGNOSIS — G250 Essential tremor: Secondary | ICD-10-CM | POA: Diagnosis not present

## 2018-07-12 DIAGNOSIS — G479 Sleep disorder, unspecified: Secondary | ICD-10-CM | POA: Diagnosis not present

## 2018-07-12 DIAGNOSIS — I129 Hypertensive chronic kidney disease with stage 1 through stage 4 chronic kidney disease, or unspecified chronic kidney disease: Secondary | ICD-10-CM | POA: Diagnosis not present

## 2018-07-12 DIAGNOSIS — Z9181 History of falling: Secondary | ICD-10-CM | POA: Diagnosis not present

## 2018-07-12 DIAGNOSIS — G309 Alzheimer's disease, unspecified: Secondary | ICD-10-CM | POA: Diagnosis not present

## 2018-07-12 DIAGNOSIS — E559 Vitamin D deficiency, unspecified: Secondary | ICD-10-CM | POA: Diagnosis not present

## 2018-07-12 DIAGNOSIS — M549 Dorsalgia, unspecified: Secondary | ICD-10-CM | POA: Diagnosis not present

## 2018-07-12 DIAGNOSIS — N189 Chronic kidney disease, unspecified: Secondary | ICD-10-CM | POA: Diagnosis not present

## 2018-07-12 DIAGNOSIS — G8929 Other chronic pain: Secondary | ICD-10-CM | POA: Diagnosis not present

## 2018-07-13 DIAGNOSIS — Z9181 History of falling: Secondary | ICD-10-CM | POA: Diagnosis not present

## 2018-07-13 DIAGNOSIS — I129 Hypertensive chronic kidney disease with stage 1 through stage 4 chronic kidney disease, or unspecified chronic kidney disease: Secondary | ICD-10-CM | POA: Diagnosis not present

## 2018-07-13 DIAGNOSIS — G309 Alzheimer's disease, unspecified: Secondary | ICD-10-CM | POA: Diagnosis not present

## 2018-07-13 DIAGNOSIS — N189 Chronic kidney disease, unspecified: Secondary | ICD-10-CM | POA: Diagnosis not present

## 2018-07-13 DIAGNOSIS — G479 Sleep disorder, unspecified: Secondary | ICD-10-CM | POA: Diagnosis not present

## 2018-07-13 DIAGNOSIS — R202 Paresthesia of skin: Secondary | ICD-10-CM | POA: Diagnosis not present

## 2018-07-13 DIAGNOSIS — G250 Essential tremor: Secondary | ICD-10-CM | POA: Diagnosis not present

## 2018-07-13 DIAGNOSIS — G8929 Other chronic pain: Secondary | ICD-10-CM | POA: Diagnosis not present

## 2018-07-13 DIAGNOSIS — R69 Illness, unspecified: Secondary | ICD-10-CM | POA: Diagnosis not present

## 2018-07-13 DIAGNOSIS — K219 Gastro-esophageal reflux disease without esophagitis: Secondary | ICD-10-CM | POA: Diagnosis not present

## 2018-07-13 DIAGNOSIS — E559 Vitamin D deficiency, unspecified: Secondary | ICD-10-CM | POA: Diagnosis not present

## 2018-07-13 DIAGNOSIS — M549 Dorsalgia, unspecified: Secondary | ICD-10-CM | POA: Diagnosis not present

## 2018-07-18 DIAGNOSIS — G309 Alzheimer's disease, unspecified: Secondary | ICD-10-CM | POA: Diagnosis not present

## 2018-07-18 DIAGNOSIS — E559 Vitamin D deficiency, unspecified: Secondary | ICD-10-CM | POA: Diagnosis not present

## 2018-07-18 DIAGNOSIS — R202 Paresthesia of skin: Secondary | ICD-10-CM | POA: Diagnosis not present

## 2018-07-18 DIAGNOSIS — N189 Chronic kidney disease, unspecified: Secondary | ICD-10-CM | POA: Diagnosis not present

## 2018-07-18 DIAGNOSIS — K219 Gastro-esophageal reflux disease without esophagitis: Secondary | ICD-10-CM | POA: Diagnosis not present

## 2018-07-18 DIAGNOSIS — G479 Sleep disorder, unspecified: Secondary | ICD-10-CM | POA: Diagnosis not present

## 2018-07-18 DIAGNOSIS — G250 Essential tremor: Secondary | ICD-10-CM | POA: Diagnosis not present

## 2018-07-18 DIAGNOSIS — I129 Hypertensive chronic kidney disease with stage 1 through stage 4 chronic kidney disease, or unspecified chronic kidney disease: Secondary | ICD-10-CM | POA: Diagnosis not present

## 2018-07-18 DIAGNOSIS — Z9181 History of falling: Secondary | ICD-10-CM | POA: Diagnosis not present

## 2018-07-18 DIAGNOSIS — G8929 Other chronic pain: Secondary | ICD-10-CM | POA: Diagnosis not present

## 2018-07-18 DIAGNOSIS — M549 Dorsalgia, unspecified: Secondary | ICD-10-CM | POA: Diagnosis not present

## 2018-07-18 DIAGNOSIS — R69 Illness, unspecified: Secondary | ICD-10-CM | POA: Diagnosis not present

## 2018-07-19 DIAGNOSIS — K219 Gastro-esophageal reflux disease without esophagitis: Secondary | ICD-10-CM | POA: Diagnosis not present

## 2018-07-19 DIAGNOSIS — M549 Dorsalgia, unspecified: Secondary | ICD-10-CM | POA: Diagnosis not present

## 2018-07-19 DIAGNOSIS — Z9181 History of falling: Secondary | ICD-10-CM | POA: Diagnosis not present

## 2018-07-19 DIAGNOSIS — R202 Paresthesia of skin: Secondary | ICD-10-CM | POA: Diagnosis not present

## 2018-07-19 DIAGNOSIS — G309 Alzheimer's disease, unspecified: Secondary | ICD-10-CM | POA: Diagnosis not present

## 2018-07-19 DIAGNOSIS — G8929 Other chronic pain: Secondary | ICD-10-CM | POA: Diagnosis not present

## 2018-07-19 DIAGNOSIS — R69 Illness, unspecified: Secondary | ICD-10-CM | POA: Diagnosis not present

## 2018-07-19 DIAGNOSIS — N189 Chronic kidney disease, unspecified: Secondary | ICD-10-CM | POA: Diagnosis not present

## 2018-07-19 DIAGNOSIS — Z6829 Body mass index (BMI) 29.0-29.9, adult: Secondary | ICD-10-CM | POA: Diagnosis not present

## 2018-07-19 DIAGNOSIS — K921 Melena: Secondary | ICD-10-CM | POA: Diagnosis not present

## 2018-07-19 DIAGNOSIS — G250 Essential tremor: Secondary | ICD-10-CM | POA: Diagnosis not present

## 2018-07-19 DIAGNOSIS — R5383 Other fatigue: Secondary | ICD-10-CM | POA: Diagnosis not present

## 2018-07-19 DIAGNOSIS — E559 Vitamin D deficiency, unspecified: Secondary | ICD-10-CM | POA: Diagnosis not present

## 2018-07-19 DIAGNOSIS — I129 Hypertensive chronic kidney disease with stage 1 through stage 4 chronic kidney disease, or unspecified chronic kidney disease: Secondary | ICD-10-CM | POA: Diagnosis not present

## 2018-07-19 DIAGNOSIS — G479 Sleep disorder, unspecified: Secondary | ICD-10-CM | POA: Diagnosis not present

## 2018-07-20 DIAGNOSIS — R202 Paresthesia of skin: Secondary | ICD-10-CM | POA: Diagnosis not present

## 2018-07-20 DIAGNOSIS — M549 Dorsalgia, unspecified: Secondary | ICD-10-CM | POA: Diagnosis not present

## 2018-07-20 DIAGNOSIS — R69 Illness, unspecified: Secondary | ICD-10-CM | POA: Diagnosis not present

## 2018-07-20 DIAGNOSIS — N189 Chronic kidney disease, unspecified: Secondary | ICD-10-CM | POA: Diagnosis not present

## 2018-07-20 DIAGNOSIS — G309 Alzheimer's disease, unspecified: Secondary | ICD-10-CM | POA: Diagnosis not present

## 2018-07-20 DIAGNOSIS — G479 Sleep disorder, unspecified: Secondary | ICD-10-CM | POA: Diagnosis not present

## 2018-07-20 DIAGNOSIS — K219 Gastro-esophageal reflux disease without esophagitis: Secondary | ICD-10-CM | POA: Diagnosis not present

## 2018-07-20 DIAGNOSIS — G250 Essential tremor: Secondary | ICD-10-CM | POA: Diagnosis not present

## 2018-07-20 DIAGNOSIS — E559 Vitamin D deficiency, unspecified: Secondary | ICD-10-CM | POA: Diagnosis not present

## 2018-07-20 DIAGNOSIS — I129 Hypertensive chronic kidney disease with stage 1 through stage 4 chronic kidney disease, or unspecified chronic kidney disease: Secondary | ICD-10-CM | POA: Diagnosis not present

## 2018-07-20 DIAGNOSIS — G8929 Other chronic pain: Secondary | ICD-10-CM | POA: Diagnosis not present

## 2018-07-20 DIAGNOSIS — Z9181 History of falling: Secondary | ICD-10-CM | POA: Diagnosis not present

## 2018-07-22 ENCOUNTER — Telehealth (HOSPITAL_COMMUNITY): Payer: Self-pay | Admitting: *Deleted

## 2018-07-22 NOTE — Telephone Encounter (Signed)
Left instruction on VM for CT heart on 07/25/2018 at 830a

## 2018-07-25 ENCOUNTER — Other Ambulatory Visit: Payer: Self-pay

## 2018-07-25 ENCOUNTER — Ambulatory Visit (HOSPITAL_COMMUNITY)
Admission: RE | Admit: 2018-07-25 | Discharge: 2018-07-25 | Disposition: A | Discharge status: Home / Self Care | Payer: Medicare HMO | Source: Ambulatory Visit | Attending: Cardiology | Admitting: Cardiology

## 2018-07-25 ENCOUNTER — Ambulatory Visit (HOSPITAL_COMMUNITY): Payer: Medicare HMO

## 2018-07-25 DIAGNOSIS — R079 Chest pain, unspecified: Secondary | ICD-10-CM | POA: Insufficient documentation

## 2018-07-25 DIAGNOSIS — I209 Angina pectoris, unspecified: Secondary | ICD-10-CM | POA: Diagnosis not present

## 2018-07-25 LAB — POCT I-STAT CREATININE: Creatinine, Ser: 1.3 mg/dL — ABNORMAL HIGH (ref 0.44–1.00)

## 2018-07-25 MED ORDER — NITROGLYCERIN 0.4 MG SL SUBL
SUBLINGUAL_TABLET | SUBLINGUAL | Status: AC
  Filled 2018-07-25: qty 2

## 2018-07-25 MED ORDER — IOHEXOL 350 MG/ML SOLN
100.0000 mL | Freq: Once | INTRAVENOUS | Status: AC | PRN
  Administered 2018-07-25: 100 mL via INTRAVENOUS

## 2018-07-25 MED ORDER — NITROGLYCERIN 0.4 MG SL SUBL
0.8000 mg | SUBLINGUAL_TABLET | Freq: Once | SUBLINGUAL | Status: AC
  Administered 2018-07-25: 0.8 mg via SUBLINGUAL

## 2018-07-25 NOTE — Progress Notes (Signed)
CT complete. Patient denies any complaints. Offered snack and beverage.

## 2018-07-26 DIAGNOSIS — K219 Gastro-esophageal reflux disease without esophagitis: Secondary | ICD-10-CM | POA: Diagnosis not present

## 2018-07-26 DIAGNOSIS — I129 Hypertensive chronic kidney disease with stage 1 through stage 4 chronic kidney disease, or unspecified chronic kidney disease: Secondary | ICD-10-CM | POA: Diagnosis not present

## 2018-07-26 DIAGNOSIS — R202 Paresthesia of skin: Secondary | ICD-10-CM | POA: Diagnosis not present

## 2018-07-26 DIAGNOSIS — G250 Essential tremor: Secondary | ICD-10-CM | POA: Diagnosis not present

## 2018-07-26 DIAGNOSIS — G309 Alzheimer's disease, unspecified: Secondary | ICD-10-CM | POA: Diagnosis not present

## 2018-07-26 DIAGNOSIS — M549 Dorsalgia, unspecified: Secondary | ICD-10-CM | POA: Diagnosis not present

## 2018-07-26 DIAGNOSIS — G8929 Other chronic pain: Secondary | ICD-10-CM | POA: Diagnosis not present

## 2018-07-26 DIAGNOSIS — E559 Vitamin D deficiency, unspecified: Secondary | ICD-10-CM | POA: Diagnosis not present

## 2018-07-26 DIAGNOSIS — Z9181 History of falling: Secondary | ICD-10-CM | POA: Diagnosis not present

## 2018-07-26 DIAGNOSIS — R69 Illness, unspecified: Secondary | ICD-10-CM | POA: Diagnosis not present

## 2018-08-01 DIAGNOSIS — Z9181 History of falling: Secondary | ICD-10-CM | POA: Diagnosis not present

## 2018-08-01 DIAGNOSIS — K219 Gastro-esophageal reflux disease without esophagitis: Secondary | ICD-10-CM | POA: Diagnosis not present

## 2018-08-01 DIAGNOSIS — R69 Illness, unspecified: Secondary | ICD-10-CM | POA: Diagnosis not present

## 2018-08-01 DIAGNOSIS — I129 Hypertensive chronic kidney disease with stage 1 through stage 4 chronic kidney disease, or unspecified chronic kidney disease: Secondary | ICD-10-CM | POA: Diagnosis not present

## 2018-08-01 DIAGNOSIS — G309 Alzheimer's disease, unspecified: Secondary | ICD-10-CM | POA: Diagnosis not present

## 2018-08-01 DIAGNOSIS — M549 Dorsalgia, unspecified: Secondary | ICD-10-CM | POA: Diagnosis not present

## 2018-08-01 DIAGNOSIS — G250 Essential tremor: Secondary | ICD-10-CM | POA: Diagnosis not present

## 2018-08-01 DIAGNOSIS — R202 Paresthesia of skin: Secondary | ICD-10-CM | POA: Diagnosis not present

## 2018-08-01 DIAGNOSIS — E559 Vitamin D deficiency, unspecified: Secondary | ICD-10-CM | POA: Diagnosis not present

## 2018-08-01 DIAGNOSIS — G8929 Other chronic pain: Secondary | ICD-10-CM | POA: Diagnosis not present

## 2018-08-03 ENCOUNTER — Telehealth: Payer: Self-pay | Admitting: *Deleted

## 2018-08-03 ENCOUNTER — Encounter: Payer: Self-pay | Admitting: *Deleted

## 2018-08-03 NOTE — Telephone Encounter (Signed)
Gave pt forms for Division [employer] to pt today with letter signed by [facility] clearing pt.

## 2018-08-08 DIAGNOSIS — E559 Vitamin D deficiency, unspecified: Secondary | ICD-10-CM | POA: Diagnosis not present

## 2018-08-08 DIAGNOSIS — G250 Essential tremor: Secondary | ICD-10-CM | POA: Diagnosis not present

## 2018-08-08 DIAGNOSIS — R202 Paresthesia of skin: Secondary | ICD-10-CM | POA: Diagnosis not present

## 2018-08-08 DIAGNOSIS — I129 Hypertensive chronic kidney disease with stage 1 through stage 4 chronic kidney disease, or unspecified chronic kidney disease: Secondary | ICD-10-CM | POA: Diagnosis not present

## 2018-08-08 DIAGNOSIS — K219 Gastro-esophageal reflux disease without esophagitis: Secondary | ICD-10-CM | POA: Diagnosis not present

## 2018-08-08 DIAGNOSIS — R69 Illness, unspecified: Secondary | ICD-10-CM | POA: Diagnosis not present

## 2018-08-08 DIAGNOSIS — M549 Dorsalgia, unspecified: Secondary | ICD-10-CM | POA: Diagnosis not present

## 2018-08-08 DIAGNOSIS — G309 Alzheimer's disease, unspecified: Secondary | ICD-10-CM | POA: Diagnosis not present

## 2018-08-08 DIAGNOSIS — G8929 Other chronic pain: Secondary | ICD-10-CM | POA: Diagnosis not present

## 2018-08-08 DIAGNOSIS — Z9181 History of falling: Secondary | ICD-10-CM | POA: Diagnosis not present

## 2018-08-16 DIAGNOSIS — L309 Dermatitis, unspecified: Secondary | ICD-10-CM | POA: Diagnosis not present

## 2018-08-16 DIAGNOSIS — E7800 Pure hypercholesterolemia, unspecified: Secondary | ICD-10-CM | POA: Diagnosis not present

## 2018-08-16 DIAGNOSIS — Z79899 Other long term (current) drug therapy: Secondary | ICD-10-CM | POA: Diagnosis not present

## 2018-08-16 DIAGNOSIS — G479 Sleep disorder, unspecified: Secondary | ICD-10-CM | POA: Diagnosis not present

## 2018-08-16 DIAGNOSIS — I129 Hypertensive chronic kidney disease with stage 1 through stage 4 chronic kidney disease, or unspecified chronic kidney disease: Secondary | ICD-10-CM | POA: Diagnosis not present

## 2018-08-16 DIAGNOSIS — Z139 Encounter for screening, unspecified: Secondary | ICD-10-CM | POA: Diagnosis not present

## 2018-08-16 DIAGNOSIS — K219 Gastro-esophageal reflux disease without esophagitis: Secondary | ICD-10-CM | POA: Diagnosis not present

## 2018-08-16 DIAGNOSIS — N189 Chronic kidney disease, unspecified: Secondary | ICD-10-CM | POA: Diagnosis not present

## 2018-08-16 DIAGNOSIS — G309 Alzheimer's disease, unspecified: Secondary | ICD-10-CM | POA: Diagnosis not present

## 2018-08-16 DIAGNOSIS — G8929 Other chronic pain: Secondary | ICD-10-CM | POA: Diagnosis not present

## 2018-08-16 DIAGNOSIS — R7303 Prediabetes: Secondary | ICD-10-CM | POA: Diagnosis not present

## 2018-08-16 DIAGNOSIS — R202 Paresthesia of skin: Secondary | ICD-10-CM | POA: Diagnosis not present

## 2018-08-16 DIAGNOSIS — M549 Dorsalgia, unspecified: Secondary | ICD-10-CM | POA: Diagnosis not present

## 2018-08-16 DIAGNOSIS — E559 Vitamin D deficiency, unspecified: Secondary | ICD-10-CM | POA: Diagnosis not present

## 2018-08-16 DIAGNOSIS — D649 Anemia, unspecified: Secondary | ICD-10-CM | POA: Diagnosis not present

## 2018-08-16 DIAGNOSIS — G250 Essential tremor: Secondary | ICD-10-CM | POA: Diagnosis not present

## 2018-08-16 DIAGNOSIS — Z9181 History of falling: Secondary | ICD-10-CM | POA: Diagnosis not present

## 2018-08-16 DIAGNOSIS — R69 Illness, unspecified: Secondary | ICD-10-CM | POA: Diagnosis not present

## 2018-08-23 DIAGNOSIS — D649 Anemia, unspecified: Secondary | ICD-10-CM | POA: Diagnosis not present

## 2018-08-23 DIAGNOSIS — I129 Hypertensive chronic kidney disease with stage 1 through stage 4 chronic kidney disease, or unspecified chronic kidney disease: Secondary | ICD-10-CM | POA: Diagnosis not present

## 2018-08-24 NOTE — Progress Notes (Signed)
Cardiology Office Note:    Date:  08/25/2018   ID:  Sharon Wise, DOB 06-Apr-1950, MRN [id number]  PCP:  Nicoletta Dress, MD  Cardiologist:  Shirlee More, MD    Referring MD: Earlyne Iba, NP    ASSESSMENT:    1. 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 Mixed hyperlipidemia   3. Mild CAD   4. Costochondritis    PLAN:    In order of problems listed above:  1. Costochondritis - Chest wall tender to palpation. Intermittent episodes of "shooting" chest pain relieved by rest every other day. Will refer to PT. Recommend Tylenol ES 2 tablets twice per day as she cannot take NSAID due to her CKDIII. 2. Hypertensive heart disease with CKD III - Labs 08/16/18 with creatinine 1.2 and GFR 46. BP well controlled. Does endorse some orthostatic hypotension, as below. Continue present anti hypertensive regimen.  3. HLD - LDL 83 on labs 08/16/18, normal liver function. Continue statin.  4. Nonobstructive CAD - Cardiac CTA with minimal plaque in RCA without stenosis. Continue aspirin, beta blocker, statin. No indication for ischemic evaluation at this time.  5. Orthostatic hypotension - Describes episodes of light headedness on standing. Description is consistent with orthostatic hypotension. Would not increase her Imdur today due to this. Counseled on safety precaution such as careful transfers.   Next appointment: 6 months   Medication Adjustments/Labs and Tests Ordered: Current medicines are reviewed at length with the patient today.  Concerns regarding medicines are outlined above.  No orders of the defined types were placed in this encounter.  Meds ordered this encounter  Medications  . acetaminophen (TYLENOL) 500 MG tablet    Sig: Take 1 tablet (500 mg total) by mouth 2 (two) times daily.    Dispense:  60 tablet    Refill:  1    Chief Complaint  Patient presents with  . Follow-up   Chief Complaint: 68 yo female PMH HTN, CKDIII, chest pain  presents for follow up after CTA.  History of Present Illness:    Sharon Wise is a 68 y.o. female with a hx of HTN, CKDIII, previous chest pain s/p normal pharmacologic myocardial perfusion study Aug 2019 last seen 06/15/18. 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  She was recommended for cardiac CTA which was done 07/25/18. Results were calcium score 0.4 agatston units placing her in the 43rd percentile for age, gender suggesting intermediate risk for future cardiac events. She had a right dominant coronary artery system with mixed plaque in the proximal RCA with minimal stenosis. No notable extra-cardiac findings.   She reports still having episodes of chest pain. Occur at rest and with activity. Described as shooting pain. She takes nitro occasionally which helps "sometimes" but gives her a headache. She describes the pain as "shooting" across her midsternum, does not rotate.   She checks her BP intermittently at home with a wrist cuff. Cannot recall readings. She has PT at home and tells me her "BP dropped some". She does endorse getting lightheaded and it sounds consistent with orthostatic hypotension per her description.   Cardiac CTA 07/25/2018:   Calcium Score: 0.4 Agatston units.  Coronary Arteries: Right dominant with no anomalies  LM: No plaque or stenosis.  LAD system:  No plaque or stenosis.  Circumflex system: No plaque or stenosis.  RCA system: There is misregistration artifact but it does not affect  the ability to read the study. Mixed plaque proximal RCA with minimal stenosis  IMPRESSION: 1. Coronary artery calcium score 0.4 Agatston units places the patient in the 43rd percentile for age and gender, suggesting intermediate risk for future cardiac events.  2.  No obstructive CAD.  Compliance with diet, lifestyle and medications: Yes Past Medical History:  Diagnosis  Date  . Arthritis   . Chronic kidney disease   . Hypertension   . Tremor     Past Surgical History:  Procedure Laterality Date  . CARPAL TUNNEL RELEASE    . FOOT SURGERY    . JOINT REPLACEMENT    . REPLACEMENT UNICONDYLAR JOINT KNEE    . VAGINAL HYSTERECTOMY      Current Medications: Current Meds  Medication Sig  . ADVAIR HFA 230-21 MCG/ACT inhaler Inhale 1 puff into the lungs every 12 (twelve) hours.  Marland Kitchen amLODipine (NORVASC) 10 MG tablet Take 10 mg by mouth daily.  Marland Kitchen aspirin EC 81 MG tablet Take 81 mg by mouth daily.  . citalopram (CELEXA) 20 MG tablet Take 20 mg by mouth daily.  . clonazePAM (KLONOPIN) 0.5 MG tablet Take 0.5 mg by mouth 2 (two) times daily.   . diclofenac (VOLTAREN) 50 MG EC tablet Take 50 mg by mouth 3 (three) times daily.   Marland Kitchen donepezil (ARICEPT ODT) 10 MG disintegrating tablet Take 10 mg by mouth daily.  . famotidine (PEPCID) 40 MG tablet Take 40 mg by mouth daily.  . isosorbide mononitrate (IMDUR) 30 MG 24 hr tablet Take 30 mg by mouth daily.  . memantine (NAMENDA) 5 MG tablet Take 5 mg by mouth 2 (two) times daily.  . metoprolol tartrate (LOPRESSOR) 25 MG tablet Take 1 tablet (25 mg total) by mouth 2 (two) times daily.  . nitroGLYCERIN (NITROSTAT) 0.4 MG SL tablet Place 1 tablet (0.4 mg total) under the tongue every 5 (five) minutes as needed for up to 30 days for chest pain.  Marland Kitchen omeprazole (PRILOSEC) 20 MG capsule Take 20 mg by mouth daily.  Marland Kitchen oxybutynin (DITROPAN-XL) 5 MG 24 hr tablet Take 5 mg by mouth daily.  . potassium chloride SA (K-DUR) 20 MEQ tablet Take 20 mEq by mouth every evening.  . pravastatin (PRAVACHOL) 40 MG tablet Take 20 mg by mouth daily.   Marland Kitchen rOPINIRole (REQUIP) 0.25 MG tablet Take 0.25 mg by mouth 2 (two) times a day.   . Wheat Dextrin (BENEFIBER) POWD Take 1 (one) tablespoon(s) daily  . [DISCONTINUED] acetaminophen (TYLENOL) 650 MG CR tablet Take 650 mg by mouth every 8 (eight) hours as needed.   . [DISCONTINUED] polyethylene glycol  (MIRALAX / GLYCOLAX) 17 g packet Take 17 g by mouth daily as needed.     Allergies:   Meloxicam   Social History   Socioeconomic History  . Marital status: Divorced    Spouse name: Not on file  . Number of children: Not on file  . Years of education: Not on file  . Highest education level: Not on file  Occupational History  . Not on file  Social Needs  . Financial resource strain: Not on file  . Food insecurity    Worry: Not on file    Inability: Not on file  . Transportation needs    Medical: Not on file    Non-medical: Not on file  Tobacco Use  . Smoking status: Never Smoker  . Smokeless tobacco: Never Used  Substance and Sexual Activity  . Alcohol use: Never    Frequency:  Never  . Drug use: Never  . Sexual activity: Not on file  Lifestyle  . Physical activity    Days per week: Not on file    Minutes per session: Not on file  . Stress: Not on file  Relationships  . Social Herbalist on phone: Not on file    Gets together: Not on file    Attends religious service: Not on file    Active member of club or organization: Not on file    Attends meetings of clubs or organizations: Not on file    Relationship status: Not on file  Other Topics Concern  . Not on file  Social History Narrative  . Not on file     Family History: The patient's family history includes Cancer in her sister and sister; Heart disease in her father and mother; Hypertension in her mother. ROS:   Please see the history of present illness.    Review of Systems  Constitution: Negative for chills, fever and malaise/fatigue.  Cardiovascular: Positive for chest pain and dyspnea on exertion (stable at baseline). Negative for irregular heartbeat and leg swelling.  Respiratory: Negative for cough, shortness of breath and wheezing.   Gastrointestinal: Negative for nausea and vomiting.  Neurological: Positive for light-headedness. Negative for focal weakness and weakness.    All other  systems reviewed and are negative.  EKGs/Labs/Other Studies Reviewed:    The following studies were reviewed today:  EKG: No EKG today.  Recent Labs: 08/16/18 via [facility]: A1c 5.8, ALT 12, AST 17, creatinine 1.2, Hb 10.1, Plt 175x10E3, K 4.2 07/25/2018: Creatinine, Ser 1.30  Recent Lipid Panel 08/16/18 via [facility]: total cholesterol 183, HDL 83, LDL 80, triglycerides 98  Physical Exam:    VS:  BP 120/80 (BP Location: Right Arm, Patient Position: Sitting, Cuff Size: Normal)   Pulse 61   Temp 98.2 F (36.8 C)   Ht 5\' 2"  (1.575 m)   Wt 164 lb (74.4 kg)   SpO2 97%   BMI 30.00 kg/m     Wt Readings from Last 3 Encounters:  08/25/18 164 lb (74.4 kg)  06/15/18 164 lb (74.4 kg)     GEN:  Well nourished, well developed in no acute distress HEENT: Normal NECK: No JVD; No carotid bruits LYMPHATICS: No lymphadenopathy CARDIAC: RRR, no murmurs, rubs, gallops RESPIRATORY:  Clear to auscultation without rales, wheezing or rhonchi  ABDOMEN: Soft, non-tender, non-distended MUSCULOSKELETAL:  No edema; No deformity; Walks with cane. SKIN: Warm and dry NEUROLOGIC:  Alert and oriented x 3 PSYCHIATRIC:  Normal affect; Anxious   Signed, Shirlee More, MD  08/25/2018 9:32 AM    [facility]

## 2018-08-25 ENCOUNTER — Other Ambulatory Visit: Payer: Self-pay

## 2018-08-25 ENCOUNTER — Encounter: Payer: Self-pay | Admitting: Cardiology

## 2018-08-25 ENCOUNTER — Ambulatory Visit (INDEPENDENT_AMBULATORY_CARE_PROVIDER_SITE_OTHER): Payer: Medicare HMO | Admitting: Cardiology

## 2018-08-25 VITALS — BP 120/80 | HR 61 | Temp 98.2°F | Ht 62.0 in | Wt 164.0 lb

## 2018-08-25 DIAGNOSIS — N183 Chronic kidney disease, stage 3 unspecified: Secondary | ICD-10-CM

## 2018-08-25 DIAGNOSIS — M940 Chondrocostal junction syndrome [Tietze]: Secondary | ICD-10-CM

## 2018-08-25 DIAGNOSIS — I2510 Atherosclerotic heart disease of native coronary artery without angina pectoris: Secondary | ICD-10-CM | POA: Diagnosis not present

## 2018-08-25 DIAGNOSIS — I129 Hypertensive chronic kidney disease with stage 1 through stage 4 chronic kidney disease, or unspecified chronic kidney disease: Secondary | ICD-10-CM

## 2018-08-25 DIAGNOSIS — E782 Mixed hyperlipidemia: Secondary | ICD-10-CM | POA: Diagnosis not present

## 2018-08-25 MED ORDER — ACETAMINOPHEN 500 MG PO TABS: 500.0000 mg | ORAL_TABLET | Freq: Two times a day (BID) | ORAL | 1 refills | Status: DC

## 2018-08-25 MED ORDER — ACETAMINOPHEN 500 MG PO TABS: 1000.0000 mg | ORAL_TABLET | Freq: Two times a day (BID) | ORAL | 1 refills | Status: DC

## 2018-08-25 NOTE — Patient Instructions (Addendum)
Medication Instructions:  START Tylenol Extra Strength 2 tablets twice per day for costochondritis  If you need a refill on your cardiac medications before your next appointment, please call your pharmacy.   Lab work: None today.  If you have labs (blood work) drawn today and your tests are completely normal, you will receive your results only by: Marland Kitchen MyChart Message (if you have MyChart) OR . A paper copy in the mail If you have any lab test that is abnormal or we need to change your treatment, we will call you to review the results.  Testing/Procedures: We will refer you to PT for costochondritis.   Follow-Up: At [facility], you and your health needs are our priority.  As part of our continuing mission to provide you with exceptional heart care, we have created designated Provider Care Teams.  These Care Teams include your primary Cardiologist (physician) and Advanced Practice Providers (APPs -  Physician Assistants and Nurse Practitioners) who all work together to provide you with the care you need, when you need it. You will need a follow up appointment in 6 months.  Please call our office 2 months in advance to schedule this appointment.  You may see Shirlee More, MD or another member of our [place] Provider Team in [place]: Jenne Campus, MD . Jyl Heinz, MD  Any Other Special Instructions Will Be Listed Below (If Applicable). None.   Costochondritis Costochondritis is swelling and irritation (inflammation) of the tissue (cartilage) that connects your ribs to your breastbone (sternum). This causes pain in the front of your chest. Usually, the pain:  Starts gradually.  Is in more than one rib. This condition usually goes away on its own over time. Follow these instructions at home:  Do not do anything that makes your pain worse.  If directed, put ice on the painful area: ? Put ice in a plastic bag. ? Place a towel between your skin and the bag. ? Leave  the ice on for 20 minutes, 2-3 times a day.  If directed, put heat on the affected area as often as told by your doctor. Use the heat source that your doctor tells you to use, such as a moist heat pack or a heating pad. ? Place a towel between your skin and the heat source. ? Leave the heat on for 20-30 minutes. ? Take off the heat if your skin turns bright red. This is very important if you cannot feel pain, heat, or cold. You may have a greater risk of getting burned.  Take over-the-counter and prescription medicines only as told by your doctor.  Return to your normal activities as told by your doctor. Ask your doctor what activities are safe for you.  Keep all follow-up visits as told by your doctor. This is important. Contact a doctor if:  You have chills or a fever.  Your pain does not go away or it gets worse.  You have a cough that does not go away. Get help right away if:  You are short of breath. This information is not intended to replace advice given to you by your health care provider. Make sure you discuss any questions you have with your health care provider. Document Released: 06/24/2007 Document Revised: 01/20/2017 Document Reviewed: 05/01/2015 Elsevier Patient Education  2020 [employer].

## 2018-08-29 DIAGNOSIS — G250 Essential tremor: Secondary | ICD-10-CM | POA: Diagnosis not present

## 2018-08-29 DIAGNOSIS — I129 Hypertensive chronic kidney disease with stage 1 through stage 4 chronic kidney disease, or unspecified chronic kidney disease: Secondary | ICD-10-CM | POA: Diagnosis not present

## 2018-08-29 DIAGNOSIS — R202 Paresthesia of skin: Secondary | ICD-10-CM | POA: Diagnosis not present

## 2018-08-29 DIAGNOSIS — G309 Alzheimer's disease, unspecified: Secondary | ICD-10-CM | POA: Diagnosis not present

## 2018-08-29 DIAGNOSIS — R69 Illness, unspecified: Secondary | ICD-10-CM | POA: Diagnosis not present

## 2018-08-29 DIAGNOSIS — E559 Vitamin D deficiency, unspecified: Secondary | ICD-10-CM | POA: Diagnosis not present

## 2018-08-29 DIAGNOSIS — K219 Gastro-esophageal reflux disease without esophagitis: Secondary | ICD-10-CM | POA: Diagnosis not present

## 2018-08-29 DIAGNOSIS — N189 Chronic kidney disease, unspecified: Secondary | ICD-10-CM | POA: Diagnosis not present

## 2018-08-29 DIAGNOSIS — G479 Sleep disorder, unspecified: Secondary | ICD-10-CM | POA: Diagnosis not present

## 2018-08-31 DIAGNOSIS — R195 Other fecal abnormalities: Secondary | ICD-10-CM | POA: Diagnosis not present

## 2018-08-31 DIAGNOSIS — D510 Vitamin B12 deficiency anemia due to intrinsic factor deficiency: Secondary | ICD-10-CM | POA: Diagnosis not present

## 2018-09-01 DIAGNOSIS — M47816 Spondylosis without myelopathy or radiculopathy, lumbar region: Secondary | ICD-10-CM | POA: Diagnosis not present

## 2018-09-05 DIAGNOSIS — M940 Chondrocostal junction syndrome [Tietze]: Secondary | ICD-10-CM | POA: Diagnosis not present

## 2018-09-05 DIAGNOSIS — M25611 Stiffness of right shoulder, not elsewhere classified: Secondary | ICD-10-CM | POA: Diagnosis not present

## 2018-09-05 DIAGNOSIS — R293 Abnormal posture: Secondary | ICD-10-CM | POA: Diagnosis not present

## 2018-09-05 DIAGNOSIS — M25612 Stiffness of left shoulder, not elsewhere classified: Secondary | ICD-10-CM | POA: Diagnosis not present

## 2018-09-05 DIAGNOSIS — M2560 Stiffness of unspecified joint, not elsewhere classified: Secondary | ICD-10-CM | POA: Diagnosis not present

## 2018-09-07 DIAGNOSIS — R293 Abnormal posture: Secondary | ICD-10-CM | POA: Diagnosis not present

## 2018-09-07 DIAGNOSIS — M2560 Stiffness of unspecified joint, not elsewhere classified: Secondary | ICD-10-CM | POA: Diagnosis not present

## 2018-09-07 DIAGNOSIS — M940 Chondrocostal junction syndrome [Tietze]: Secondary | ICD-10-CM | POA: Diagnosis not present

## 2018-09-07 DIAGNOSIS — M25612 Stiffness of left shoulder, not elsewhere classified: Secondary | ICD-10-CM | POA: Diagnosis not present

## 2018-09-07 DIAGNOSIS — M25611 Stiffness of right shoulder, not elsewhere classified: Secondary | ICD-10-CM | POA: Diagnosis not present

## 2018-09-08 DIAGNOSIS — D649 Anemia, unspecified: Secondary | ICD-10-CM | POA: Diagnosis not present

## 2018-09-08 DIAGNOSIS — R195 Other fecal abnormalities: Secondary | ICD-10-CM | POA: Diagnosis not present

## 2018-09-08 DIAGNOSIS — K5900 Constipation, unspecified: Secondary | ICD-10-CM | POA: Diagnosis not present

## 2018-09-08 DIAGNOSIS — R1084 Generalized abdominal pain: Secondary | ICD-10-CM | POA: Diagnosis not present

## 2018-09-09 DIAGNOSIS — M25611 Stiffness of right shoulder, not elsewhere classified: Secondary | ICD-10-CM | POA: Diagnosis not present

## 2018-09-09 DIAGNOSIS — R293 Abnormal posture: Secondary | ICD-10-CM | POA: Diagnosis not present

## 2018-09-09 DIAGNOSIS — M25612 Stiffness of left shoulder, not elsewhere classified: Secondary | ICD-10-CM | POA: Diagnosis not present

## 2018-09-09 DIAGNOSIS — M940 Chondrocostal junction syndrome [Tietze]: Secondary | ICD-10-CM | POA: Diagnosis not present

## 2018-09-09 DIAGNOSIS — M2560 Stiffness of unspecified joint, not elsewhere classified: Secondary | ICD-10-CM | POA: Diagnosis not present

## 2018-09-12 DIAGNOSIS — M25611 Stiffness of right shoulder, not elsewhere classified: Secondary | ICD-10-CM | POA: Diagnosis not present

## 2018-09-12 DIAGNOSIS — M940 Chondrocostal junction syndrome [Tietze]: Secondary | ICD-10-CM | POA: Diagnosis not present

## 2018-09-12 DIAGNOSIS — R293 Abnormal posture: Secondary | ICD-10-CM | POA: Diagnosis not present

## 2018-09-12 DIAGNOSIS — M25612 Stiffness of left shoulder, not elsewhere classified: Secondary | ICD-10-CM | POA: Diagnosis not present

## 2018-09-12 DIAGNOSIS — M2560 Stiffness of unspecified joint, not elsewhere classified: Secondary | ICD-10-CM | POA: Diagnosis not present

## 2018-09-13 ENCOUNTER — Other Ambulatory Visit: Payer: Self-pay | Admitting: Unknown Physician Specialty

## 2018-09-13 DIAGNOSIS — N959 Unspecified menopausal and perimenopausal disorder: Secondary | ICD-10-CM | POA: Diagnosis not present

## 2018-09-13 DIAGNOSIS — Z1331 Encounter for screening for depression: Secondary | ICD-10-CM | POA: Diagnosis not present

## 2018-09-13 DIAGNOSIS — Z1231 Encounter for screening mammogram for malignant neoplasm of breast: Secondary | ICD-10-CM | POA: Diagnosis not present

## 2018-09-13 DIAGNOSIS — E785 Hyperlipidemia, unspecified: Secondary | ICD-10-CM | POA: Diagnosis not present

## 2018-09-13 DIAGNOSIS — Z6830 Body mass index (BMI) 30.0-30.9, adult: Secondary | ICD-10-CM | POA: Diagnosis not present

## 2018-09-13 DIAGNOSIS — R195 Other fecal abnormalities: Secondary | ICD-10-CM

## 2018-09-13 DIAGNOSIS — Z9181 History of falling: Secondary | ICD-10-CM | POA: Diagnosis not present

## 2018-09-13 DIAGNOSIS — Z0000 Encounter for general adult medical examination without abnormal findings: Secondary | ICD-10-CM | POA: Diagnosis not present

## 2018-09-14 DIAGNOSIS — R293 Abnormal posture: Secondary | ICD-10-CM | POA: Diagnosis not present

## 2018-09-14 DIAGNOSIS — M2560 Stiffness of unspecified joint, not elsewhere classified: Secondary | ICD-10-CM | POA: Diagnosis not present

## 2018-09-14 DIAGNOSIS — M25611 Stiffness of right shoulder, not elsewhere classified: Secondary | ICD-10-CM | POA: Diagnosis not present

## 2018-09-14 DIAGNOSIS — M25612 Stiffness of left shoulder, not elsewhere classified: Secondary | ICD-10-CM | POA: Diagnosis not present

## 2018-09-14 DIAGNOSIS — M940 Chondrocostal junction syndrome [Tietze]: Secondary | ICD-10-CM | POA: Diagnosis not present

## 2018-09-16 DIAGNOSIS — M25611 Stiffness of right shoulder, not elsewhere classified: Secondary | ICD-10-CM | POA: Diagnosis not present

## 2018-09-16 DIAGNOSIS — M940 Chondrocostal junction syndrome [Tietze]: Secondary | ICD-10-CM | POA: Diagnosis not present

## 2018-09-16 DIAGNOSIS — M2560 Stiffness of unspecified joint, not elsewhere classified: Secondary | ICD-10-CM | POA: Diagnosis not present

## 2018-09-16 DIAGNOSIS — M25612 Stiffness of left shoulder, not elsewhere classified: Secondary | ICD-10-CM | POA: Diagnosis not present

## 2018-09-16 DIAGNOSIS — R293 Abnormal posture: Secondary | ICD-10-CM | POA: Diagnosis not present

## 2018-09-19 DIAGNOSIS — M2560 Stiffness of unspecified joint, not elsewhere classified: Secondary | ICD-10-CM | POA: Diagnosis not present

## 2018-09-19 DIAGNOSIS — M25611 Stiffness of right shoulder, not elsewhere classified: Secondary | ICD-10-CM | POA: Diagnosis not present

## 2018-09-19 DIAGNOSIS — R293 Abnormal posture: Secondary | ICD-10-CM | POA: Diagnosis not present

## 2018-09-19 DIAGNOSIS — M25612 Stiffness of left shoulder, not elsewhere classified: Secondary | ICD-10-CM | POA: Diagnosis not present

## 2018-09-19 DIAGNOSIS — M940 Chondrocostal junction syndrome [Tietze]: Secondary | ICD-10-CM | POA: Diagnosis not present

## 2018-09-21 DIAGNOSIS — R293 Abnormal posture: Secondary | ICD-10-CM | POA: Diagnosis not present

## 2018-09-21 DIAGNOSIS — M940 Chondrocostal junction syndrome [Tietze]: Secondary | ICD-10-CM | POA: Diagnosis not present

## 2018-09-21 DIAGNOSIS — M2560 Stiffness of unspecified joint, not elsewhere classified: Secondary | ICD-10-CM | POA: Diagnosis not present

## 2018-09-21 DIAGNOSIS — M25611 Stiffness of right shoulder, not elsewhere classified: Secondary | ICD-10-CM | POA: Diagnosis not present

## 2018-09-21 DIAGNOSIS — M25612 Stiffness of left shoulder, not elsewhere classified: Secondary | ICD-10-CM | POA: Diagnosis not present

## 2018-09-22 DIAGNOSIS — M2560 Stiffness of unspecified joint, not elsewhere classified: Secondary | ICD-10-CM | POA: Diagnosis not present

## 2018-09-22 DIAGNOSIS — M940 Chondrocostal junction syndrome [Tietze]: Secondary | ICD-10-CM | POA: Diagnosis not present

## 2018-09-22 DIAGNOSIS — M25612 Stiffness of left shoulder, not elsewhere classified: Secondary | ICD-10-CM | POA: Diagnosis not present

## 2018-09-22 DIAGNOSIS — M25611 Stiffness of right shoulder, not elsewhere classified: Secondary | ICD-10-CM | POA: Diagnosis not present

## 2018-09-22 DIAGNOSIS — R293 Abnormal posture: Secondary | ICD-10-CM | POA: Diagnosis not present

## 2018-09-23 ENCOUNTER — Ambulatory Visit
Admission: RE | Admit: 2018-09-23 | Discharge: 2018-09-23 | Disposition: A | Discharge status: Home / Self Care | Payer: Medicare HMO | Source: Ambulatory Visit | Attending: Unknown Physician Specialty | Admitting: Unknown Physician Specialty

## 2018-09-23 DIAGNOSIS — R195 Other fecal abnormalities: Secondary | ICD-10-CM

## 2018-09-23 DIAGNOSIS — K921 Melena: Secondary | ICD-10-CM | POA: Diagnosis not present

## 2018-09-27 DIAGNOSIS — M25612 Stiffness of left shoulder, not elsewhere classified: Secondary | ICD-10-CM | POA: Diagnosis not present

## 2018-09-27 DIAGNOSIS — M940 Chondrocostal junction syndrome [Tietze]: Secondary | ICD-10-CM | POA: Diagnosis not present

## 2018-09-27 DIAGNOSIS — R293 Abnormal posture: Secondary | ICD-10-CM | POA: Diagnosis not present

## 2018-09-27 DIAGNOSIS — M2560 Stiffness of unspecified joint, not elsewhere classified: Secondary | ICD-10-CM | POA: Diagnosis not present

## 2018-09-27 DIAGNOSIS — M25611 Stiffness of right shoulder, not elsewhere classified: Secondary | ICD-10-CM | POA: Diagnosis not present

## 2018-09-28 DIAGNOSIS — D649 Anemia, unspecified: Secondary | ICD-10-CM | POA: Diagnosis not present

## 2018-09-28 DIAGNOSIS — R634 Abnormal weight loss: Secondary | ICD-10-CM | POA: Diagnosis not present

## 2018-09-28 DIAGNOSIS — M940 Chondrocostal junction syndrome [Tietze]: Secondary | ICD-10-CM | POA: Diagnosis not present

## 2018-09-29 DIAGNOSIS — M25611 Stiffness of right shoulder, not elsewhere classified: Secondary | ICD-10-CM | POA: Diagnosis not present

## 2018-09-29 DIAGNOSIS — R293 Abnormal posture: Secondary | ICD-10-CM | POA: Diagnosis not present

## 2018-09-29 DIAGNOSIS — M940 Chondrocostal junction syndrome [Tietze]: Secondary | ICD-10-CM | POA: Diagnosis not present

## 2018-09-29 DIAGNOSIS — M25612 Stiffness of left shoulder, not elsewhere classified: Secondary | ICD-10-CM | POA: Diagnosis not present

## 2018-09-29 DIAGNOSIS — M2560 Stiffness of unspecified joint, not elsewhere classified: Secondary | ICD-10-CM | POA: Diagnosis not present

## 2018-10-04 DIAGNOSIS — M2560 Stiffness of unspecified joint, not elsewhere classified: Secondary | ICD-10-CM | POA: Diagnosis not present

## 2018-10-04 DIAGNOSIS — R293 Abnormal posture: Secondary | ICD-10-CM | POA: Diagnosis not present

## 2018-10-04 DIAGNOSIS — M25611 Stiffness of right shoulder, not elsewhere classified: Secondary | ICD-10-CM | POA: Diagnosis not present

## 2018-10-04 DIAGNOSIS — M25612 Stiffness of left shoulder, not elsewhere classified: Secondary | ICD-10-CM | POA: Diagnosis not present

## 2018-10-04 DIAGNOSIS — M940 Chondrocostal junction syndrome [Tietze]: Secondary | ICD-10-CM | POA: Diagnosis not present

## 2018-10-10 DIAGNOSIS — M25612 Stiffness of left shoulder, not elsewhere classified: Secondary | ICD-10-CM | POA: Diagnosis not present

## 2018-10-10 DIAGNOSIS — M25611 Stiffness of right shoulder, not elsewhere classified: Secondary | ICD-10-CM | POA: Diagnosis not present

## 2018-10-10 DIAGNOSIS — M2560 Stiffness of unspecified joint, not elsewhere classified: Secondary | ICD-10-CM | POA: Diagnosis not present

## 2018-10-10 DIAGNOSIS — M940 Chondrocostal junction syndrome [Tietze]: Secondary | ICD-10-CM | POA: Diagnosis not present

## 2018-10-10 DIAGNOSIS — R293 Abnormal posture: Secondary | ICD-10-CM | POA: Diagnosis not present

## 2018-10-11 ENCOUNTER — Other Ambulatory Visit: Payer: Self-pay | Admitting: Cardiology

## 2018-10-12 DIAGNOSIS — R293 Abnormal posture: Secondary | ICD-10-CM | POA: Diagnosis not present

## 2018-10-12 DIAGNOSIS — M25612 Stiffness of left shoulder, not elsewhere classified: Secondary | ICD-10-CM | POA: Diagnosis not present

## 2018-10-12 DIAGNOSIS — M940 Chondrocostal junction syndrome [Tietze]: Secondary | ICD-10-CM | POA: Diagnosis not present

## 2018-10-12 DIAGNOSIS — M2560 Stiffness of unspecified joint, not elsewhere classified: Secondary | ICD-10-CM | POA: Diagnosis not present

## 2018-10-12 DIAGNOSIS — M25611 Stiffness of right shoulder, not elsewhere classified: Secondary | ICD-10-CM | POA: Diagnosis not present

## 2018-10-18 DIAGNOSIS — M940 Chondrocostal junction syndrome [Tietze]: Secondary | ICD-10-CM | POA: Diagnosis not present

## 2018-10-18 DIAGNOSIS — M2560 Stiffness of unspecified joint, not elsewhere classified: Secondary | ICD-10-CM | POA: Diagnosis not present

## 2018-10-18 DIAGNOSIS — M25611 Stiffness of right shoulder, not elsewhere classified: Secondary | ICD-10-CM | POA: Diagnosis not present

## 2018-10-18 DIAGNOSIS — M25612 Stiffness of left shoulder, not elsewhere classified: Secondary | ICD-10-CM | POA: Diagnosis not present

## 2018-10-18 DIAGNOSIS — R293 Abnormal posture: Secondary | ICD-10-CM | POA: Diagnosis not present

## 2018-10-20 DIAGNOSIS — M25612 Stiffness of left shoulder, not elsewhere classified: Secondary | ICD-10-CM | POA: Diagnosis not present

## 2018-10-20 DIAGNOSIS — R293 Abnormal posture: Secondary | ICD-10-CM | POA: Diagnosis not present

## 2018-10-20 DIAGNOSIS — M940 Chondrocostal junction syndrome [Tietze]: Secondary | ICD-10-CM | POA: Diagnosis not present

## 2018-10-20 DIAGNOSIS — M2560 Stiffness of unspecified joint, not elsewhere classified: Secondary | ICD-10-CM | POA: Diagnosis not present

## 2018-10-20 DIAGNOSIS — M25611 Stiffness of right shoulder, not elsewhere classified: Secondary | ICD-10-CM | POA: Diagnosis not present

## 2018-10-25 DIAGNOSIS — M2560 Stiffness of unspecified joint, not elsewhere classified: Secondary | ICD-10-CM | POA: Diagnosis not present

## 2018-10-25 DIAGNOSIS — M940 Chondrocostal junction syndrome [Tietze]: Secondary | ICD-10-CM | POA: Diagnosis not present

## 2018-10-25 DIAGNOSIS — R293 Abnormal posture: Secondary | ICD-10-CM | POA: Diagnosis not present

## 2018-10-25 DIAGNOSIS — M25611 Stiffness of right shoulder, not elsewhere classified: Secondary | ICD-10-CM | POA: Diagnosis not present

## 2018-10-25 DIAGNOSIS — M25612 Stiffness of left shoulder, not elsewhere classified: Secondary | ICD-10-CM | POA: Diagnosis not present

## 2018-10-27 DIAGNOSIS — M25611 Stiffness of right shoulder, not elsewhere classified: Secondary | ICD-10-CM | POA: Diagnosis not present

## 2018-10-27 DIAGNOSIS — M2560 Stiffness of unspecified joint, not elsewhere classified: Secondary | ICD-10-CM | POA: Diagnosis not present

## 2018-10-27 DIAGNOSIS — M940 Chondrocostal junction syndrome [Tietze]: Secondary | ICD-10-CM | POA: Diagnosis not present

## 2018-10-27 DIAGNOSIS — M25612 Stiffness of left shoulder, not elsewhere classified: Secondary | ICD-10-CM | POA: Diagnosis not present

## 2018-10-27 DIAGNOSIS — R293 Abnormal posture: Secondary | ICD-10-CM | POA: Diagnosis not present

## 2018-10-31 DIAGNOSIS — M25612 Stiffness of left shoulder, not elsewhere classified: Secondary | ICD-10-CM | POA: Diagnosis not present

## 2018-10-31 DIAGNOSIS — R293 Abnormal posture: Secondary | ICD-10-CM | POA: Diagnosis not present

## 2018-10-31 DIAGNOSIS — M2560 Stiffness of unspecified joint, not elsewhere classified: Secondary | ICD-10-CM | POA: Diagnosis not present

## 2018-10-31 DIAGNOSIS — M940 Chondrocostal junction syndrome [Tietze]: Secondary | ICD-10-CM | POA: Diagnosis not present

## 2018-10-31 DIAGNOSIS — M25611 Stiffness of right shoulder, not elsewhere classified: Secondary | ICD-10-CM | POA: Diagnosis not present

## 2018-11-02 DIAGNOSIS — M25612 Stiffness of left shoulder, not elsewhere classified: Secondary | ICD-10-CM | POA: Diagnosis not present

## 2018-11-02 DIAGNOSIS — M2560 Stiffness of unspecified joint, not elsewhere classified: Secondary | ICD-10-CM | POA: Diagnosis not present

## 2018-11-02 DIAGNOSIS — R293 Abnormal posture: Secondary | ICD-10-CM | POA: Diagnosis not present

## 2018-11-02 DIAGNOSIS — M25611 Stiffness of right shoulder, not elsewhere classified: Secondary | ICD-10-CM | POA: Diagnosis not present

## 2018-11-02 DIAGNOSIS — M940 Chondrocostal junction syndrome [Tietze]: Secondary | ICD-10-CM | POA: Diagnosis not present

## 2018-11-08 DIAGNOSIS — M940 Chondrocostal junction syndrome [Tietze]: Secondary | ICD-10-CM | POA: Diagnosis not present

## 2018-11-08 DIAGNOSIS — R293 Abnormal posture: Secondary | ICD-10-CM | POA: Diagnosis not present

## 2018-11-08 DIAGNOSIS — M2560 Stiffness of unspecified joint, not elsewhere classified: Secondary | ICD-10-CM | POA: Diagnosis not present

## 2018-11-08 DIAGNOSIS — M25612 Stiffness of left shoulder, not elsewhere classified: Secondary | ICD-10-CM | POA: Diagnosis not present

## 2018-11-08 DIAGNOSIS — M25611 Stiffness of right shoulder, not elsewhere classified: Secondary | ICD-10-CM | POA: Diagnosis not present

## 2018-11-09 DIAGNOSIS — G301 Alzheimer's disease with late onset: Secondary | ICD-10-CM | POA: Diagnosis not present

## 2018-11-09 DIAGNOSIS — G252 Other specified forms of tremor: Secondary | ICD-10-CM | POA: Diagnosis not present

## 2018-11-09 DIAGNOSIS — M47816 Spondylosis without myelopathy or radiculopathy, lumbar region: Secondary | ICD-10-CM

## 2018-11-09 DIAGNOSIS — M545 Low back pain: Secondary | ICD-10-CM | POA: Diagnosis not present

## 2018-11-09 HISTORY — DX: Spondylosis without myelopathy or radiculopathy, lumbar region: M47.816

## 2018-11-10 DIAGNOSIS — H52223 Regular astigmatism, bilateral: Secondary | ICD-10-CM | POA: Diagnosis not present

## 2018-11-10 DIAGNOSIS — H47093 Other disorders of optic nerve, not elsewhere classified, bilateral: Secondary | ICD-10-CM | POA: Diagnosis not present

## 2018-11-11 DIAGNOSIS — M25611 Stiffness of right shoulder, not elsewhere classified: Secondary | ICD-10-CM | POA: Diagnosis not present

## 2018-11-11 DIAGNOSIS — R293 Abnormal posture: Secondary | ICD-10-CM | POA: Diagnosis not present

## 2018-11-11 DIAGNOSIS — M25612 Stiffness of left shoulder, not elsewhere classified: Secondary | ICD-10-CM | POA: Diagnosis not present

## 2018-11-11 DIAGNOSIS — M940 Chondrocostal junction syndrome [Tietze]: Secondary | ICD-10-CM | POA: Diagnosis not present

## 2018-11-11 DIAGNOSIS — M2560 Stiffness of unspecified joint, not elsewhere classified: Secondary | ICD-10-CM | POA: Diagnosis not present

## 2018-11-14 DIAGNOSIS — M940 Chondrocostal junction syndrome [Tietze]: Secondary | ICD-10-CM | POA: Diagnosis not present

## 2018-11-14 DIAGNOSIS — M25612 Stiffness of left shoulder, not elsewhere classified: Secondary | ICD-10-CM | POA: Diagnosis not present

## 2018-11-14 DIAGNOSIS — M2560 Stiffness of unspecified joint, not elsewhere classified: Secondary | ICD-10-CM | POA: Diagnosis not present

## 2018-11-14 DIAGNOSIS — M25611 Stiffness of right shoulder, not elsewhere classified: Secondary | ICD-10-CM | POA: Diagnosis not present

## 2018-11-14 DIAGNOSIS — R293 Abnormal posture: Secondary | ICD-10-CM | POA: Diagnosis not present

## 2018-11-16 DIAGNOSIS — M25611 Stiffness of right shoulder, not elsewhere classified: Secondary | ICD-10-CM | POA: Diagnosis not present

## 2018-11-16 DIAGNOSIS — M2560 Stiffness of unspecified joint, not elsewhere classified: Secondary | ICD-10-CM | POA: Diagnosis not present

## 2018-11-16 DIAGNOSIS — M25612 Stiffness of left shoulder, not elsewhere classified: Secondary | ICD-10-CM | POA: Diagnosis not present

## 2018-11-16 DIAGNOSIS — M940 Chondrocostal junction syndrome [Tietze]: Secondary | ICD-10-CM | POA: Diagnosis not present

## 2018-11-16 DIAGNOSIS — R293 Abnormal posture: Secondary | ICD-10-CM | POA: Diagnosis not present

## 2018-11-18 DIAGNOSIS — Z13820 Encounter for screening for osteoporosis: Secondary | ICD-10-CM | POA: Diagnosis not present

## 2018-11-18 DIAGNOSIS — N959 Unspecified menopausal and perimenopausal disorder: Secondary | ICD-10-CM | POA: Diagnosis not present

## 2018-11-18 DIAGNOSIS — Z1231 Encounter for screening mammogram for malignant neoplasm of breast: Secondary | ICD-10-CM | POA: Diagnosis not present

## 2018-11-21 DIAGNOSIS — M25611 Stiffness of right shoulder, not elsewhere classified: Secondary | ICD-10-CM | POA: Diagnosis not present

## 2018-11-21 DIAGNOSIS — M2560 Stiffness of unspecified joint, not elsewhere classified: Secondary | ICD-10-CM | POA: Diagnosis not present

## 2018-11-21 DIAGNOSIS — R293 Abnormal posture: Secondary | ICD-10-CM | POA: Diagnosis not present

## 2018-11-21 DIAGNOSIS — M940 Chondrocostal junction syndrome [Tietze]: Secondary | ICD-10-CM | POA: Diagnosis not present

## 2018-11-21 DIAGNOSIS — M25612 Stiffness of left shoulder, not elsewhere classified: Secondary | ICD-10-CM | POA: Diagnosis not present

## 2018-11-23 DIAGNOSIS — M25611 Stiffness of right shoulder, not elsewhere classified: Secondary | ICD-10-CM | POA: Diagnosis not present

## 2018-11-23 DIAGNOSIS — M25612 Stiffness of left shoulder, not elsewhere classified: Secondary | ICD-10-CM | POA: Diagnosis not present

## 2018-11-23 DIAGNOSIS — M2560 Stiffness of unspecified joint, not elsewhere classified: Secondary | ICD-10-CM | POA: Diagnosis not present

## 2018-11-23 DIAGNOSIS — M940 Chondrocostal junction syndrome [Tietze]: Secondary | ICD-10-CM | POA: Diagnosis not present

## 2018-11-23 DIAGNOSIS — R293 Abnormal posture: Secondary | ICD-10-CM | POA: Diagnosis not present

## 2018-11-28 DIAGNOSIS — M25611 Stiffness of right shoulder, not elsewhere classified: Secondary | ICD-10-CM | POA: Diagnosis not present

## 2018-11-28 DIAGNOSIS — R293 Abnormal posture: Secondary | ICD-10-CM | POA: Diagnosis not present

## 2018-11-28 DIAGNOSIS — M940 Chondrocostal junction syndrome [Tietze]: Secondary | ICD-10-CM | POA: Diagnosis not present

## 2018-11-28 DIAGNOSIS — M2560 Stiffness of unspecified joint, not elsewhere classified: Secondary | ICD-10-CM | POA: Diagnosis not present

## 2018-11-28 DIAGNOSIS — M25612 Stiffness of left shoulder, not elsewhere classified: Secondary | ICD-10-CM | POA: Diagnosis not present

## 2018-11-29 DIAGNOSIS — M47816 Spondylosis without myelopathy or radiculopathy, lumbar region: Secondary | ICD-10-CM | POA: Diagnosis not present

## 2018-11-30 DIAGNOSIS — D649 Anemia, unspecified: Secondary | ICD-10-CM | POA: Diagnosis not present

## 2018-12-02 DIAGNOSIS — E7800 Pure hypercholesterolemia, unspecified: Secondary | ICD-10-CM | POA: Diagnosis not present

## 2018-12-02 DIAGNOSIS — Z23 Encounter for immunization: Secondary | ICD-10-CM | POA: Diagnosis not present

## 2018-12-02 DIAGNOSIS — D649 Anemia, unspecified: Secondary | ICD-10-CM | POA: Diagnosis not present

## 2018-12-02 DIAGNOSIS — I129 Hypertensive chronic kidney disease with stage 1 through stage 4 chronic kidney disease, or unspecified chronic kidney disease: Secondary | ICD-10-CM | POA: Diagnosis not present

## 2018-12-02 DIAGNOSIS — R7303 Prediabetes: Secondary | ICD-10-CM | POA: Diagnosis not present

## 2018-12-05 DIAGNOSIS — M940 Chondrocostal junction syndrome [Tietze]: Secondary | ICD-10-CM | POA: Diagnosis not present

## 2018-12-05 DIAGNOSIS — M25611 Stiffness of right shoulder, not elsewhere classified: Secondary | ICD-10-CM | POA: Diagnosis not present

## 2018-12-05 DIAGNOSIS — M25612 Stiffness of left shoulder, not elsewhere classified: Secondary | ICD-10-CM | POA: Diagnosis not present

## 2018-12-05 DIAGNOSIS — R293 Abnormal posture: Secondary | ICD-10-CM | POA: Diagnosis not present

## 2018-12-05 DIAGNOSIS — M2560 Stiffness of unspecified joint, not elsewhere classified: Secondary | ICD-10-CM | POA: Diagnosis not present

## 2018-12-07 DIAGNOSIS — M2560 Stiffness of unspecified joint, not elsewhere classified: Secondary | ICD-10-CM | POA: Diagnosis not present

## 2018-12-07 DIAGNOSIS — R293 Abnormal posture: Secondary | ICD-10-CM | POA: Diagnosis not present

## 2018-12-07 DIAGNOSIS — M25612 Stiffness of left shoulder, not elsewhere classified: Secondary | ICD-10-CM | POA: Diagnosis not present

## 2018-12-07 DIAGNOSIS — M940 Chondrocostal junction syndrome [Tietze]: Secondary | ICD-10-CM | POA: Diagnosis not present

## 2018-12-07 DIAGNOSIS — M25611 Stiffness of right shoulder, not elsewhere classified: Secondary | ICD-10-CM | POA: Diagnosis not present

## 2018-12-09 ENCOUNTER — Other Ambulatory Visit: Payer: Self-pay

## 2018-12-09 DIAGNOSIS — R928 Other abnormal and inconclusive findings on diagnostic imaging of breast: Secondary | ICD-10-CM | POA: Diagnosis not present

## 2018-12-09 DIAGNOSIS — R195 Other fecal abnormalities: Secondary | ICD-10-CM | POA: Diagnosis not present

## 2018-12-09 DIAGNOSIS — I129 Hypertensive chronic kidney disease with stage 1 through stage 4 chronic kidney disease, or unspecified chronic kidney disease: Secondary | ICD-10-CM | POA: Diagnosis not present

## 2018-12-09 MED ORDER — ISOSORBIDE MONONITRATE ER 30 MG PO TB24: 30.0000 mg | ORAL_TABLET | Freq: Every day | ORAL | 0 refills | Status: AC

## 2018-12-13 DIAGNOSIS — R195 Other fecal abnormalities: Secondary | ICD-10-CM | POA: Diagnosis not present

## 2018-12-13 DIAGNOSIS — D649 Anemia, unspecified: Secondary | ICD-10-CM | POA: Diagnosis not present

## 2018-12-13 DIAGNOSIS — K5900 Constipation, unspecified: Secondary | ICD-10-CM | POA: Diagnosis not present

## 2018-12-16 DIAGNOSIS — I129 Hypertensive chronic kidney disease with stage 1 through stage 4 chronic kidney disease, or unspecified chronic kidney disease: Secondary | ICD-10-CM | POA: Diagnosis not present

## 2019-01-09 ENCOUNTER — Other Ambulatory Visit: Payer: Self-pay | Admitting: Cardiology

## 2019-02-06 ENCOUNTER — Other Ambulatory Visit: Payer: Self-pay | Admitting: Cardiology

## 2019-03-06 DIAGNOSIS — M9080 Osteopathy in diseases classified elsewhere, unspecified site: Secondary | ICD-10-CM | POA: Diagnosis not present

## 2019-03-06 DIAGNOSIS — E889 Metabolic disorder, unspecified: Secondary | ICD-10-CM | POA: Diagnosis not present

## 2019-03-06 DIAGNOSIS — N182 Chronic kidney disease, stage 2 (mild): Secondary | ICD-10-CM | POA: Diagnosis not present

## 2019-03-06 DIAGNOSIS — I129 Hypertensive chronic kidney disease with stage 1 through stage 4 chronic kidney disease, or unspecified chronic kidney disease: Secondary | ICD-10-CM | POA: Diagnosis not present

## 2019-03-06 DIAGNOSIS — E559 Vitamin D deficiency, unspecified: Secondary | ICD-10-CM | POA: Diagnosis not present

## 2019-03-07 DIAGNOSIS — I160 Hypertensive urgency: Secondary | ICD-10-CM

## 2019-03-07 DIAGNOSIS — N183 Chronic kidney disease, stage 3 unspecified: Secondary | ICD-10-CM | POA: Diagnosis not present

## 2019-03-07 DIAGNOSIS — R0602 Shortness of breath: Secondary | ICD-10-CM | POA: Diagnosis not present

## 2019-03-07 DIAGNOSIS — Z79899 Other long term (current) drug therapy: Secondary | ICD-10-CM | POA: Diagnosis not present

## 2019-03-07 DIAGNOSIS — G8929 Other chronic pain: Secondary | ICD-10-CM | POA: Diagnosis not present

## 2019-03-07 DIAGNOSIS — R079 Chest pain, unspecified: Secondary | ICD-10-CM

## 2019-03-07 DIAGNOSIS — R001 Bradycardia, unspecified: Secondary | ICD-10-CM

## 2019-03-07 DIAGNOSIS — M940 Chondrocostal junction syndrome [Tietze]: Secondary | ICD-10-CM | POA: Diagnosis not present

## 2019-03-07 DIAGNOSIS — E785 Hyperlipidemia, unspecified: Secondary | ICD-10-CM | POA: Diagnosis not present

## 2019-03-07 DIAGNOSIS — N1831 Chronic kidney disease, stage 3a: Secondary | ICD-10-CM | POA: Diagnosis not present

## 2019-03-07 DIAGNOSIS — I129 Hypertensive chronic kidney disease with stage 1 through stage 4 chronic kidney disease, or unspecified chronic kidney disease: Secondary | ICD-10-CM | POA: Diagnosis not present

## 2019-03-07 DIAGNOSIS — I2510 Atherosclerotic heart disease of native coronary artery without angina pectoris: Secondary | ICD-10-CM

## 2019-03-07 DIAGNOSIS — R0789 Other chest pain: Secondary | ICD-10-CM | POA: Diagnosis not present

## 2019-03-07 DIAGNOSIS — N189 Chronic kidney disease, unspecified: Secondary | ICD-10-CM

## 2019-03-07 DIAGNOSIS — M160 Bilateral primary osteoarthritis of hip: Secondary | ICD-10-CM | POA: Diagnosis not present

## 2019-03-08 ENCOUNTER — Other Ambulatory Visit: Payer: Self-pay | Admitting: Cardiology

## 2019-03-08 DIAGNOSIS — R0789 Other chest pain: Secondary | ICD-10-CM | POA: Diagnosis not present

## 2019-03-08 DIAGNOSIS — N1831 Chronic kidney disease, stage 3a: Secondary | ICD-10-CM | POA: Diagnosis not present

## 2019-03-08 DIAGNOSIS — I2510 Atherosclerotic heart disease of native coronary artery without angina pectoris: Secondary | ICD-10-CM | POA: Diagnosis not present

## 2019-03-08 DIAGNOSIS — N183 Chronic kidney disease, stage 3 unspecified: Secondary | ICD-10-CM | POA: Diagnosis not present

## 2019-03-08 DIAGNOSIS — M940 Chondrocostal junction syndrome [Tietze]: Secondary | ICD-10-CM | POA: Diagnosis not present

## 2019-03-08 DIAGNOSIS — I129 Hypertensive chronic kidney disease with stage 1 through stage 4 chronic kidney disease, or unspecified chronic kidney disease: Secondary | ICD-10-CM | POA: Diagnosis not present

## 2019-03-08 DIAGNOSIS — R079 Chest pain, unspecified: Secondary | ICD-10-CM | POA: Diagnosis not present

## 2019-03-08 DIAGNOSIS — Z79899 Other long term (current) drug therapy: Secondary | ICD-10-CM | POA: Diagnosis not present

## 2019-03-08 DIAGNOSIS — M160 Bilateral primary osteoarthritis of hip: Secondary | ICD-10-CM | POA: Diagnosis not present

## 2019-03-08 DIAGNOSIS — R001 Bradycardia, unspecified: Secondary | ICD-10-CM | POA: Diagnosis not present

## 2019-03-08 DIAGNOSIS — E785 Hyperlipidemia, unspecified: Secondary | ICD-10-CM | POA: Diagnosis not present

## 2019-03-08 DIAGNOSIS — G8929 Other chronic pain: Secondary | ICD-10-CM | POA: Diagnosis not present

## 2019-03-09 ENCOUNTER — Other Ambulatory Visit: Payer: Self-pay | Admitting: Cardiology

## 2019-03-09 ENCOUNTER — Telehealth: Payer: Self-pay | Admitting: Cardiology

## 2019-03-09 DIAGNOSIS — R001 Bradycardia, unspecified: Secondary | ICD-10-CM | POA: Diagnosis not present

## 2019-03-09 DIAGNOSIS — N183 Chronic kidney disease, stage 3 unspecified: Secondary | ICD-10-CM | POA: Diagnosis not present

## 2019-03-09 DIAGNOSIS — E785 Hyperlipidemia, unspecified: Secondary | ICD-10-CM | POA: Diagnosis not present

## 2019-03-09 DIAGNOSIS — M940 Chondrocostal junction syndrome [Tietze]: Secondary | ICD-10-CM | POA: Diagnosis not present

## 2019-03-09 NOTE — Telephone Encounter (Signed)
Dr.Munley I called the patient she didn't answer so I left a message. I did check the patient's chart and seen where the medication was DISCONTINUED so that's more than likely the reason that the RX request was denied. Would you like me to send in a new refill even though Tylenol isn't a medication that you would fill ?

## 2019-03-09 NOTE — Telephone Encounter (Signed)
Pt c/o medication issue:  1. Name of Medication: acetomonofin (tylenol) 500 mg  2. How are you currently taking this medication (dosage and times per day)? 1 tablet 2x daily  3. Are you having a reaction (difficulty breathing--STAT)? no  4. What is your medication issue? Pharmacy said the refill request was denied. The last rx was written by Dr. Bettina Gavia, so the Pharmacy was hoping this was done in error. Please confirl with pharmacy and send a new rx if needed

## 2019-03-09 NOTE — Telephone Encounter (Signed)
No she is in the hospital right now but thank you anyways

## 2019-03-10 DIAGNOSIS — J45909 Unspecified asthma, uncomplicated: Secondary | ICD-10-CM | POA: Diagnosis not present

## 2019-03-10 DIAGNOSIS — Z9181 History of falling: Secondary | ICD-10-CM | POA: Diagnosis not present

## 2019-03-10 DIAGNOSIS — E785 Hyperlipidemia, unspecified: Secondary | ICD-10-CM | POA: Diagnosis not present

## 2019-03-10 DIAGNOSIS — M940 Chondrocostal junction syndrome [Tietze]: Secondary | ICD-10-CM | POA: Diagnosis not present

## 2019-03-10 DIAGNOSIS — N183 Chronic kidney disease, stage 3 unspecified: Secondary | ICD-10-CM | POA: Diagnosis not present

## 2019-03-10 DIAGNOSIS — M1990 Unspecified osteoarthritis, unspecified site: Secondary | ICD-10-CM | POA: Diagnosis not present

## 2019-03-10 DIAGNOSIS — D631 Anemia in chronic kidney disease: Secondary | ICD-10-CM | POA: Diagnosis not present

## 2019-03-10 DIAGNOSIS — I951 Orthostatic hypotension: Secondary | ICD-10-CM | POA: Diagnosis not present

## 2019-03-10 DIAGNOSIS — Z96659 Presence of unspecified artificial knee joint: Secondary | ICD-10-CM | POA: Diagnosis not present

## 2019-03-10 DIAGNOSIS — I129 Hypertensive chronic kidney disease with stage 1 through stage 4 chronic kidney disease, or unspecified chronic kidney disease: Secondary | ICD-10-CM | POA: Diagnosis not present

## 2019-03-10 DIAGNOSIS — K219 Gastro-esophageal reflux disease without esophagitis: Secondary | ICD-10-CM | POA: Diagnosis not present

## 2019-03-10 DIAGNOSIS — K859 Acute pancreatitis without necrosis or infection, unspecified: Secondary | ICD-10-CM | POA: Diagnosis not present

## 2019-03-10 DIAGNOSIS — G8929 Other chronic pain: Secondary | ICD-10-CM | POA: Diagnosis not present

## 2019-03-10 DIAGNOSIS — I2510 Atherosclerotic heart disease of native coronary artery without angina pectoris: Secondary | ICD-10-CM | POA: Diagnosis not present

## 2019-03-10 DIAGNOSIS — I089 Rheumatic multiple valve disease, unspecified: Secondary | ICD-10-CM | POA: Diagnosis not present

## 2019-03-13 DIAGNOSIS — I129 Hypertensive chronic kidney disease with stage 1 through stage 4 chronic kidney disease, or unspecified chronic kidney disease: Secondary | ICD-10-CM | POA: Diagnosis not present

## 2019-03-13 DIAGNOSIS — J45909 Unspecified asthma, uncomplicated: Secondary | ICD-10-CM | POA: Diagnosis not present

## 2019-03-13 DIAGNOSIS — D631 Anemia in chronic kidney disease: Secondary | ICD-10-CM | POA: Diagnosis not present

## 2019-03-13 DIAGNOSIS — I951 Orthostatic hypotension: Secondary | ICD-10-CM | POA: Diagnosis not present

## 2019-03-13 DIAGNOSIS — E785 Hyperlipidemia, unspecified: Secondary | ICD-10-CM | POA: Diagnosis not present

## 2019-03-13 DIAGNOSIS — Z9181 History of falling: Secondary | ICD-10-CM | POA: Diagnosis not present

## 2019-03-13 DIAGNOSIS — G8929 Other chronic pain: Secondary | ICD-10-CM | POA: Diagnosis not present

## 2019-03-13 DIAGNOSIS — I089 Rheumatic multiple valve disease, unspecified: Secondary | ICD-10-CM | POA: Diagnosis not present

## 2019-03-13 DIAGNOSIS — K219 Gastro-esophageal reflux disease without esophagitis: Secondary | ICD-10-CM | POA: Diagnosis not present

## 2019-03-13 DIAGNOSIS — I2510 Atherosclerotic heart disease of native coronary artery without angina pectoris: Secondary | ICD-10-CM | POA: Diagnosis not present

## 2019-03-13 DIAGNOSIS — Z96659 Presence of unspecified artificial knee joint: Secondary | ICD-10-CM | POA: Diagnosis not present

## 2019-03-13 DIAGNOSIS — M1990 Unspecified osteoarthritis, unspecified site: Secondary | ICD-10-CM | POA: Diagnosis not present

## 2019-03-13 DIAGNOSIS — M940 Chondrocostal junction syndrome [Tietze]: Secondary | ICD-10-CM | POA: Diagnosis not present

## 2019-03-13 DIAGNOSIS — K859 Acute pancreatitis without necrosis or infection, unspecified: Secondary | ICD-10-CM | POA: Diagnosis not present

## 2019-03-13 DIAGNOSIS — N183 Chronic kidney disease, stage 3 unspecified: Secondary | ICD-10-CM | POA: Diagnosis not present

## 2019-03-15 DIAGNOSIS — M545 Low back pain: Secondary | ICD-10-CM | POA: Diagnosis not present

## 2019-03-15 DIAGNOSIS — G252 Other specified forms of tremor: Secondary | ICD-10-CM | POA: Diagnosis not present

## 2019-03-15 DIAGNOSIS — M47816 Spondylosis without myelopathy or radiculopathy, lumbar region: Secondary | ICD-10-CM | POA: Diagnosis not present

## 2019-03-15 DIAGNOSIS — G301 Alzheimer's disease with late onset: Secondary | ICD-10-CM | POA: Diagnosis not present

## 2019-03-16 DIAGNOSIS — I951 Orthostatic hypotension: Secondary | ICD-10-CM | POA: Diagnosis not present

## 2019-03-16 DIAGNOSIS — N183 Chronic kidney disease, stage 3 unspecified: Secondary | ICD-10-CM | POA: Diagnosis not present

## 2019-03-16 DIAGNOSIS — Z96659 Presence of unspecified artificial knee joint: Secondary | ICD-10-CM | POA: Diagnosis not present

## 2019-03-16 DIAGNOSIS — Z9181 History of falling: Secondary | ICD-10-CM | POA: Diagnosis not present

## 2019-03-16 DIAGNOSIS — G8929 Other chronic pain: Secondary | ICD-10-CM | POA: Diagnosis not present

## 2019-03-16 DIAGNOSIS — M1990 Unspecified osteoarthritis, unspecified site: Secondary | ICD-10-CM | POA: Diagnosis not present

## 2019-03-16 DIAGNOSIS — I129 Hypertensive chronic kidney disease with stage 1 through stage 4 chronic kidney disease, or unspecified chronic kidney disease: Secondary | ICD-10-CM | POA: Diagnosis not present

## 2019-03-16 DIAGNOSIS — K859 Acute pancreatitis without necrosis or infection, unspecified: Secondary | ICD-10-CM | POA: Diagnosis not present

## 2019-03-16 DIAGNOSIS — I2510 Atherosclerotic heart disease of native coronary artery without angina pectoris: Secondary | ICD-10-CM | POA: Diagnosis not present

## 2019-03-16 DIAGNOSIS — M940 Chondrocostal junction syndrome [Tietze]: Secondary | ICD-10-CM | POA: Diagnosis not present

## 2019-03-16 DIAGNOSIS — I089 Rheumatic multiple valve disease, unspecified: Secondary | ICD-10-CM | POA: Diagnosis not present

## 2019-03-16 DIAGNOSIS — E785 Hyperlipidemia, unspecified: Secondary | ICD-10-CM | POA: Diagnosis not present

## 2019-03-16 DIAGNOSIS — N189 Chronic kidney disease, unspecified: Secondary | ICD-10-CM | POA: Diagnosis not present

## 2019-03-16 DIAGNOSIS — D631 Anemia in chronic kidney disease: Secondary | ICD-10-CM | POA: Diagnosis not present

## 2019-03-16 DIAGNOSIS — Z09 Encounter for follow-up examination after completed treatment for conditions other than malignant neoplasm: Secondary | ICD-10-CM | POA: Diagnosis not present

## 2019-03-16 DIAGNOSIS — K219 Gastro-esophageal reflux disease without esophagitis: Secondary | ICD-10-CM | POA: Diagnosis not present

## 2019-03-16 DIAGNOSIS — J45909 Unspecified asthma, uncomplicated: Secondary | ICD-10-CM | POA: Diagnosis not present

## 2019-03-17 ENCOUNTER — Ambulatory Visit (INDEPENDENT_AMBULATORY_CARE_PROVIDER_SITE_OTHER): Payer: Medicare Other | Admitting: Cardiology

## 2019-03-17 ENCOUNTER — Other Ambulatory Visit: Payer: Self-pay

## 2019-03-17 ENCOUNTER — Encounter: Payer: Self-pay | Admitting: Cardiology

## 2019-03-17 VITALS — BP 134/90 | HR 101 | Temp 97.3°F | Ht 62.0 in | Wt 168.0 lb

## 2019-03-17 DIAGNOSIS — N183 Chronic kidney disease, stage 3 unspecified: Secondary | ICD-10-CM

## 2019-03-17 DIAGNOSIS — I089 Rheumatic multiple valve disease, unspecified: Secondary | ICD-10-CM | POA: Diagnosis not present

## 2019-03-17 DIAGNOSIS — I129 Hypertensive chronic kidney disease with stage 1 through stage 4 chronic kidney disease, or unspecified chronic kidney disease: Secondary | ICD-10-CM

## 2019-03-17 DIAGNOSIS — M940 Chondrocostal junction syndrome [Tietze]: Secondary | ICD-10-CM

## 2019-03-17 DIAGNOSIS — I2510 Atherosclerotic heart disease of native coronary artery without angina pectoris: Secondary | ICD-10-CM | POA: Diagnosis not present

## 2019-03-17 DIAGNOSIS — G8929 Other chronic pain: Secondary | ICD-10-CM | POA: Diagnosis not present

## 2019-03-17 DIAGNOSIS — E785 Hyperlipidemia, unspecified: Secondary | ICD-10-CM | POA: Diagnosis not present

## 2019-03-17 DIAGNOSIS — M1990 Unspecified osteoarthritis, unspecified site: Secondary | ICD-10-CM | POA: Diagnosis not present

## 2019-03-17 DIAGNOSIS — E782 Mixed hyperlipidemia: Secondary | ICD-10-CM

## 2019-03-17 DIAGNOSIS — J45909 Unspecified asthma, uncomplicated: Secondary | ICD-10-CM | POA: Diagnosis not present

## 2019-03-17 DIAGNOSIS — D631 Anemia in chronic kidney disease: Secondary | ICD-10-CM | POA: Diagnosis not present

## 2019-03-17 DIAGNOSIS — Z96659 Presence of unspecified artificial knee joint: Secondary | ICD-10-CM | POA: Diagnosis not present

## 2019-03-17 DIAGNOSIS — K859 Acute pancreatitis without necrosis or infection, unspecified: Secondary | ICD-10-CM | POA: Diagnosis not present

## 2019-03-17 DIAGNOSIS — Z9181 History of falling: Secondary | ICD-10-CM | POA: Diagnosis not present

## 2019-03-17 DIAGNOSIS — K219 Gastro-esophageal reflux disease without esophagitis: Secondary | ICD-10-CM | POA: Diagnosis not present

## 2019-03-17 DIAGNOSIS — I951 Orthostatic hypotension: Secondary | ICD-10-CM | POA: Diagnosis not present

## 2019-03-17 NOTE — Progress Notes (Signed)
Cardiology Office Note:    Date:  03/17/2019   ID:  Sharon Wise, DOB 18-Jun-1950, MRN [id number]  PCP:  Nicoletta Dress, MD  Cardiologist:  Shirlee More, MD    Referring MD: Nicoletta Dress, MD    ASSESSMENT:    1. Mild CAD   2. 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 Mixed hyperlipidemia   4. Costochondritis    PLAN:    In order of problems listed above:  1. Reviewed the report currently best described as atherosclerosis without stenosis chest pain is costochondral she is improving will finish her nonsteroidal anti-inflammatory drug. 2. Hypertension is improved at target continue treatment including 3. Bradycardia is improved off beta-blocker 4. Hyperlipidemia stable continue her statin lipids are at target   Next appointment: 6 months   Medication Adjustments/Labs and Tests Ordered: Current medicines are reviewed at length with the patient today.  Concerns regarding medicines are outlined above.  No orders of the defined types were placed in this encounter.  No orders of the defined types were placed in this encounter.   Chief Complaint  Patient presents with  . Follow-up  . Chest Pain  . Hypertension  . Hyperlipidemia    History of Present Illness:    Sharon Wise is a 69 y.o. female with a hx of  HTN, CKDIII, previous chest pain s/p normal pharmacologic myocardial perfusion study Aug 2019. 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 She was seen by me at [facility] the last 2 weeks.  She was sent by her PCP because her heart rate was less than 40 and blood pressure is uncontrolled.  Her beta-blocker was stopped and initially we stopped her dementia medicines but they were resumed by her neurologist.  He had atypical chest wall pain previously  had a cardiac CTA 706 2020 with a calcium score approaching 0 and no obstructive CAD.  Was treated for costochondral pain syndrome with nonsteroidal anti-inflammatory drug Celebrex and has improved.  She complains of fatigue and shortness of breath with activity no chest pain she has had no palpitation or syncope.  While in hospital troponin was normal and no evidence of acute coronary syndrome and bradycardia quickly resolved. Compliance with diet, lifestyle and medications: Yes she is quite organized Past Medical History:  Diagnosis Date  . Arthritis   . Chronic kidney disease   . Hypertension   . Tremor     Past Surgical History:  Procedure Laterality Date  . CARPAL TUNNEL RELEASE    . FOOT SURGERY    . JOINT REPLACEMENT    . REPLACEMENT UNICONDYLAR JOINT KNEE    . VAGINAL HYSTERECTOMY      Current Medications: Current Meds  Medication Sig  . ADVAIR HFA 230-21 MCG/ACT inhaler Inhale 1 puff into the lungs every 12 (twelve) hours.  . citalopram (CELEXA) 20 MG tablet Take 20 mg by mouth daily.  . clonazePAM (KLONOPIN) 0.5 MG tablet Take 0.5 mg by mouth 2 (two) times daily.   Marland Kitchen donepezil (ARICEPT ODT) 10 MG disintegrating tablet Take 10 mg by mouth daily.  . isosorbide mononitrate (IMDUR) 30 MG 24 hr tablet Take 1 tablet (30 mg total) by mouth daily.  . memantine (NAMENDA) 5 MG tablet Take 5 mg by mouth 2 (two) times daily.  . metoprolol tartrate (LOPRESSOR)  25 MG tablet TAKE ONE TABLET BY MOUTH TWICE DAILY  . nitroGLYCERIN (NITROSTAT) 0.4 MG SL tablet Place 1 tablet (0.4 mg total) under the tongue every 5 (five) minutes as needed for up to 30 days for chest pain.  Marland Kitchen omeprazole (PRILOSEC) 20 MG capsule Take 20 mg by mouth daily.  Marland Kitchen oxybutynin (DITROPAN-XL) 5 MG 24 hr tablet Take 5 mg by mouth daily.  . potassium chloride SA (K-DUR) 20 MEQ tablet Take 20 mEq by mouth every evening.  . pravastatin (PRAVACHOL) 40 MG tablet Take 20 mg by mouth daily.   Marland Kitchen rOPINIRole (REQUIP) 0.25 MG  tablet Take 0.25 mg by mouth 2 (two) times a day.   . Wheat Dextrin (BENEFIBER) POWD Take 1 (one) tablespoon(s) daily     Allergies:   Meloxicam   Social History   Socioeconomic History  . Marital status: Divorced    Spouse name: Not on file  . Number of children: Not on file  . Years of education: Not on file  . Highest education level: Not on file  Occupational History  . Not on file  Tobacco Use  . Smoking status: Never Smoker  . Smokeless tobacco: Never Used  Substance and Sexual Activity  . Alcohol use: Never  . Drug use: Never  . Sexual activity: Not Currently  Other Topics Concern  . Not on file  Social History Narrative  . Not on file   Social Determinants of Health   Financial Resource Strain:   . Difficulty of Paying Living Expenses: Not on file  Food Insecurity:   . Worried About Charity fundraiser in the Last Year: Not on file  . Ran Out of Food in the Last Year: Not on file  Transportation Needs:   . Lack of Transportation (Medical): Not on file  . Lack of Transportation (Non-Medical): Not on file  Physical Activity:   . Days of Exercise per Week: Not on file  . Minutes of Exercise per Session: Not on file  Stress:   . Feeling of Stress : Not on file  Social Connections:   . Frequency of Communication with Friends and Family: Not on file  . Frequency of Social Gatherings with Friends and Family: Not on file  . Attends Religious Services: Not on file  . Active Member of Clubs or Organizations: Not on file  . Attends Archivist Meetings: Not on file  . Marital Status: Not on file     Family History: The patient's family history includes Cancer in her sister and sister; Heart disease in her father and mother; Hypertension in her mother. ROS:   Please see the history of present illness.    All other systems reviewed and are negative.  EKGs/Labs/Other Studies Reviewed:    The following studies were reviewed today  Cardiac CTA  07/25/2018:   Calcium Score: 0.4 Agatston units. Coronary Arteries: Right dominant with no anomalies LM: No plaque or stenosis. LAD system: No plaque or stenosis. Circumflex system: No plaque or stenosis. RCA system: There is misregistration artifact but it does not affect the ability to read the study. Mixed plaque proximal RCA with minimal stenosis IMPRESSION: 1. Coronary artery calcium score 0.4 Agatston units places the patient in the 43rd percentile for age and gender, suggesting intermediate risk for future cardiac events. 2. No obstructive CAD.   Recent Labs: 07/25/2018: Creatinine, Ser 1.30  12/02/2018: Cholesterol 186 HDL 90 LDL 80 A1c 5.6 creatinine 1.2 TSH normal 1.0 Recent Lipid Panel  Component Value Date/Time   CHOL  05/01/2008 0810    155        ATP III CLASSIFICATION:  <200     mg/dL   Desirable  200-239  mg/dL   Borderline High  >=240    mg/dL   High          TRIG 115 05/01/2008 1826   HDL 55 05/01/2008 0810   CHOLHDL 2.8 05/01/2008 0810   VLDL 22 05/01/2008 0810   LDLCALC  05/01/2008 0810    78        Total Cholesterol/HDL:CHD Risk Coronary Heart Disease Risk Table                     Men   Women  1/2 Average Risk   3.4   3.3  Average Risk       5.0   4.4  2 X Average Risk   9.6   7.1  3 X Average Risk  23.4   11.0        Use the calculated Patient Ratio above and the CHD Risk Table to determine the patient's CHD Risk.        ATP III CLASSIFICATION (LDL):  <100     mg/dL   Optimal  100-129  mg/dL   Near or Above                    Optimal  130-159  mg/dL   Borderline  160-189  mg/dL   High  >190     mg/dL   Very High    Physical Exam:    VS:  BP 134/90   Pulse (!) 101   Temp (!) 97.3 F (36.3 C)   Ht 5\' 2"  (1.575 m)   Wt 168 lb (76.2 kg)   LMP  (LMP Unknown)   SpO2 97%   BMI 30.73 kg/m     Wt Readings from Last 3 Encounters:  03/17/19 168 lb (76.2 kg)  08/25/18 164 lb (74.4 kg)  06/15/18 164 lb (74.4 kg)     GEN:  Well  nourished, well developed in no acute distress HEENT: Normal NECK: No JVD; No carotid bruits LYMPHATICS: No lymphadenopathy CARDIAC: No tenderness costochondral junction RRR, no murmurs, rubs, gallops RESPIRATORY:  Clear to auscultation without rales, wheezing or rhonchi  ABDOMEN: Soft, non-tender, non-distended MUSCULOSKELETAL:  No edema; No deformity  SKIN: Warm and dry NEUROLOGIC:  Alert and oriented x 3 PSYCHIATRIC:  Normal affect    Signed, Shirlee More, MD  03/17/2019 11:55 AM    [place]

## 2019-03-17 NOTE — Patient Instructions (Signed)

## 2019-03-20 DIAGNOSIS — I2510 Atherosclerotic heart disease of native coronary artery without angina pectoris: Secondary | ICD-10-CM | POA: Diagnosis not present

## 2019-03-20 DIAGNOSIS — M940 Chondrocostal junction syndrome [Tietze]: Secondary | ICD-10-CM | POA: Diagnosis not present

## 2019-03-20 DIAGNOSIS — D631 Anemia in chronic kidney disease: Secondary | ICD-10-CM | POA: Diagnosis not present

## 2019-03-20 DIAGNOSIS — I951 Orthostatic hypotension: Secondary | ICD-10-CM | POA: Diagnosis not present

## 2019-03-20 DIAGNOSIS — I089 Rheumatic multiple valve disease, unspecified: Secondary | ICD-10-CM | POA: Diagnosis not present

## 2019-03-20 DIAGNOSIS — E785 Hyperlipidemia, unspecified: Secondary | ICD-10-CM | POA: Diagnosis not present

## 2019-03-20 DIAGNOSIS — Z9181 History of falling: Secondary | ICD-10-CM | POA: Diagnosis not present

## 2019-03-20 DIAGNOSIS — M1990 Unspecified osteoarthritis, unspecified site: Secondary | ICD-10-CM | POA: Diagnosis not present

## 2019-03-20 DIAGNOSIS — N183 Chronic kidney disease, stage 3 unspecified: Secondary | ICD-10-CM | POA: Diagnosis not present

## 2019-03-20 DIAGNOSIS — J45909 Unspecified asthma, uncomplicated: Secondary | ICD-10-CM | POA: Diagnosis not present

## 2019-03-20 DIAGNOSIS — G8929 Other chronic pain: Secondary | ICD-10-CM | POA: Diagnosis not present

## 2019-03-20 DIAGNOSIS — Z96659 Presence of unspecified artificial knee joint: Secondary | ICD-10-CM | POA: Diagnosis not present

## 2019-03-20 DIAGNOSIS — K219 Gastro-esophageal reflux disease without esophagitis: Secondary | ICD-10-CM | POA: Diagnosis not present

## 2019-03-20 DIAGNOSIS — I129 Hypertensive chronic kidney disease with stage 1 through stage 4 chronic kidney disease, or unspecified chronic kidney disease: Secondary | ICD-10-CM | POA: Diagnosis not present

## 2019-03-20 DIAGNOSIS — K859 Acute pancreatitis without necrosis or infection, unspecified: Secondary | ICD-10-CM | POA: Diagnosis not present

## 2019-03-22 DIAGNOSIS — K219 Gastro-esophageal reflux disease without esophagitis: Secondary | ICD-10-CM | POA: Diagnosis not present

## 2019-03-22 DIAGNOSIS — M1990 Unspecified osteoarthritis, unspecified site: Secondary | ICD-10-CM | POA: Diagnosis not present

## 2019-03-22 DIAGNOSIS — I2510 Atherosclerotic heart disease of native coronary artery without angina pectoris: Secondary | ICD-10-CM | POA: Diagnosis not present

## 2019-03-22 DIAGNOSIS — M940 Chondrocostal junction syndrome [Tietze]: Secondary | ICD-10-CM | POA: Diagnosis not present

## 2019-03-22 DIAGNOSIS — K859 Acute pancreatitis without necrosis or infection, unspecified: Secondary | ICD-10-CM | POA: Diagnosis not present

## 2019-03-22 DIAGNOSIS — E785 Hyperlipidemia, unspecified: Secondary | ICD-10-CM | POA: Diagnosis not present

## 2019-03-22 DIAGNOSIS — Z9181 History of falling: Secondary | ICD-10-CM | POA: Diagnosis not present

## 2019-03-22 DIAGNOSIS — G8929 Other chronic pain: Secondary | ICD-10-CM | POA: Diagnosis not present

## 2019-03-22 DIAGNOSIS — J45909 Unspecified asthma, uncomplicated: Secondary | ICD-10-CM | POA: Diagnosis not present

## 2019-03-22 DIAGNOSIS — I089 Rheumatic multiple valve disease, unspecified: Secondary | ICD-10-CM | POA: Diagnosis not present

## 2019-03-22 DIAGNOSIS — D631 Anemia in chronic kidney disease: Secondary | ICD-10-CM | POA: Diagnosis not present

## 2019-03-22 DIAGNOSIS — I129 Hypertensive chronic kidney disease with stage 1 through stage 4 chronic kidney disease, or unspecified chronic kidney disease: Secondary | ICD-10-CM | POA: Diagnosis not present

## 2019-03-22 DIAGNOSIS — Z96659 Presence of unspecified artificial knee joint: Secondary | ICD-10-CM | POA: Diagnosis not present

## 2019-03-22 DIAGNOSIS — N183 Chronic kidney disease, stage 3 unspecified: Secondary | ICD-10-CM | POA: Diagnosis not present

## 2019-03-22 DIAGNOSIS — I951 Orthostatic hypotension: Secondary | ICD-10-CM | POA: Diagnosis not present

## 2019-03-23 DIAGNOSIS — J45909 Unspecified asthma, uncomplicated: Secondary | ICD-10-CM | POA: Diagnosis not present

## 2019-03-23 DIAGNOSIS — I951 Orthostatic hypotension: Secondary | ICD-10-CM | POA: Diagnosis not present

## 2019-03-23 DIAGNOSIS — E785 Hyperlipidemia, unspecified: Secondary | ICD-10-CM | POA: Diagnosis not present

## 2019-03-23 DIAGNOSIS — Z96659 Presence of unspecified artificial knee joint: Secondary | ICD-10-CM | POA: Diagnosis not present

## 2019-03-23 DIAGNOSIS — I129 Hypertensive chronic kidney disease with stage 1 through stage 4 chronic kidney disease, or unspecified chronic kidney disease: Secondary | ICD-10-CM | POA: Diagnosis not present

## 2019-03-23 DIAGNOSIS — I089 Rheumatic multiple valve disease, unspecified: Secondary | ICD-10-CM | POA: Diagnosis not present

## 2019-03-23 DIAGNOSIS — I2510 Atherosclerotic heart disease of native coronary artery without angina pectoris: Secondary | ICD-10-CM | POA: Diagnosis not present

## 2019-03-23 DIAGNOSIS — K859 Acute pancreatitis without necrosis or infection, unspecified: Secondary | ICD-10-CM | POA: Diagnosis not present

## 2019-03-23 DIAGNOSIS — G8929 Other chronic pain: Secondary | ICD-10-CM | POA: Diagnosis not present

## 2019-03-23 DIAGNOSIS — M1990 Unspecified osteoarthritis, unspecified site: Secondary | ICD-10-CM | POA: Diagnosis not present

## 2019-03-23 DIAGNOSIS — D631 Anemia in chronic kidney disease: Secondary | ICD-10-CM | POA: Diagnosis not present

## 2019-03-23 DIAGNOSIS — K219 Gastro-esophageal reflux disease without esophagitis: Secondary | ICD-10-CM | POA: Diagnosis not present

## 2019-03-23 DIAGNOSIS — N183 Chronic kidney disease, stage 3 unspecified: Secondary | ICD-10-CM | POA: Diagnosis not present

## 2019-03-23 DIAGNOSIS — Z9181 History of falling: Secondary | ICD-10-CM | POA: Diagnosis not present

## 2019-03-23 DIAGNOSIS — M940 Chondrocostal junction syndrome [Tietze]: Secondary | ICD-10-CM | POA: Diagnosis not present

## 2019-03-27 DIAGNOSIS — I2510 Atherosclerotic heart disease of native coronary artery without angina pectoris: Secondary | ICD-10-CM | POA: Diagnosis not present

## 2019-03-27 DIAGNOSIS — Z9181 History of falling: Secondary | ICD-10-CM | POA: Diagnosis not present

## 2019-03-27 DIAGNOSIS — I089 Rheumatic multiple valve disease, unspecified: Secondary | ICD-10-CM | POA: Diagnosis not present

## 2019-03-27 DIAGNOSIS — K859 Acute pancreatitis without necrosis or infection, unspecified: Secondary | ICD-10-CM | POA: Diagnosis not present

## 2019-03-27 DIAGNOSIS — I129 Hypertensive chronic kidney disease with stage 1 through stage 4 chronic kidney disease, or unspecified chronic kidney disease: Secondary | ICD-10-CM | POA: Diagnosis not present

## 2019-03-27 DIAGNOSIS — N183 Chronic kidney disease, stage 3 unspecified: Secondary | ICD-10-CM | POA: Diagnosis not present

## 2019-03-27 DIAGNOSIS — G8929 Other chronic pain: Secondary | ICD-10-CM | POA: Diagnosis not present

## 2019-03-27 DIAGNOSIS — M940 Chondrocostal junction syndrome [Tietze]: Secondary | ICD-10-CM | POA: Diagnosis not present

## 2019-03-27 DIAGNOSIS — Z96659 Presence of unspecified artificial knee joint: Secondary | ICD-10-CM | POA: Diagnosis not present

## 2019-03-27 DIAGNOSIS — J45909 Unspecified asthma, uncomplicated: Secondary | ICD-10-CM | POA: Diagnosis not present

## 2019-03-27 DIAGNOSIS — K219 Gastro-esophageal reflux disease without esophagitis: Secondary | ICD-10-CM | POA: Diagnosis not present

## 2019-03-27 DIAGNOSIS — E785 Hyperlipidemia, unspecified: Secondary | ICD-10-CM | POA: Diagnosis not present

## 2019-03-27 DIAGNOSIS — D631 Anemia in chronic kidney disease: Secondary | ICD-10-CM | POA: Diagnosis not present

## 2019-03-27 DIAGNOSIS — M1990 Unspecified osteoarthritis, unspecified site: Secondary | ICD-10-CM | POA: Diagnosis not present

## 2019-03-27 DIAGNOSIS — I951 Orthostatic hypotension: Secondary | ICD-10-CM | POA: Diagnosis not present

## 2019-03-28 DIAGNOSIS — M940 Chondrocostal junction syndrome [Tietze]: Secondary | ICD-10-CM | POA: Diagnosis not present

## 2019-03-28 DIAGNOSIS — G8929 Other chronic pain: Secondary | ICD-10-CM | POA: Diagnosis not present

## 2019-03-28 DIAGNOSIS — I2510 Atherosclerotic heart disease of native coronary artery without angina pectoris: Secondary | ICD-10-CM | POA: Diagnosis not present

## 2019-03-28 DIAGNOSIS — I089 Rheumatic multiple valve disease, unspecified: Secondary | ICD-10-CM | POA: Diagnosis not present

## 2019-03-28 DIAGNOSIS — Z96659 Presence of unspecified artificial knee joint: Secondary | ICD-10-CM | POA: Diagnosis not present

## 2019-03-28 DIAGNOSIS — Z9181 History of falling: Secondary | ICD-10-CM | POA: Diagnosis not present

## 2019-03-28 DIAGNOSIS — E785 Hyperlipidemia, unspecified: Secondary | ICD-10-CM | POA: Diagnosis not present

## 2019-03-28 DIAGNOSIS — I129 Hypertensive chronic kidney disease with stage 1 through stage 4 chronic kidney disease, or unspecified chronic kidney disease: Secondary | ICD-10-CM | POA: Diagnosis not present

## 2019-03-28 DIAGNOSIS — I951 Orthostatic hypotension: Secondary | ICD-10-CM | POA: Diagnosis not present

## 2019-03-28 DIAGNOSIS — N183 Chronic kidney disease, stage 3 unspecified: Secondary | ICD-10-CM | POA: Diagnosis not present

## 2019-03-28 DIAGNOSIS — K219 Gastro-esophageal reflux disease without esophagitis: Secondary | ICD-10-CM | POA: Diagnosis not present

## 2019-03-28 DIAGNOSIS — M1990 Unspecified osteoarthritis, unspecified site: Secondary | ICD-10-CM | POA: Diagnosis not present

## 2019-03-28 DIAGNOSIS — K859 Acute pancreatitis without necrosis or infection, unspecified: Secondary | ICD-10-CM | POA: Diagnosis not present

## 2019-03-28 DIAGNOSIS — D631 Anemia in chronic kidney disease: Secondary | ICD-10-CM | POA: Diagnosis not present

## 2019-03-28 DIAGNOSIS — J45909 Unspecified asthma, uncomplicated: Secondary | ICD-10-CM | POA: Diagnosis not present

## 2019-03-29 DIAGNOSIS — Z96659 Presence of unspecified artificial knee joint: Secondary | ICD-10-CM | POA: Diagnosis not present

## 2019-03-29 DIAGNOSIS — D631 Anemia in chronic kidney disease: Secondary | ICD-10-CM | POA: Diagnosis not present

## 2019-03-29 DIAGNOSIS — G8929 Other chronic pain: Secondary | ICD-10-CM | POA: Diagnosis not present

## 2019-03-29 DIAGNOSIS — I951 Orthostatic hypotension: Secondary | ICD-10-CM | POA: Diagnosis not present

## 2019-03-29 DIAGNOSIS — K859 Acute pancreatitis without necrosis or infection, unspecified: Secondary | ICD-10-CM | POA: Diagnosis not present

## 2019-03-29 DIAGNOSIS — E785 Hyperlipidemia, unspecified: Secondary | ICD-10-CM | POA: Diagnosis not present

## 2019-03-29 DIAGNOSIS — J45909 Unspecified asthma, uncomplicated: Secondary | ICD-10-CM | POA: Diagnosis not present

## 2019-03-29 DIAGNOSIS — I2510 Atherosclerotic heart disease of native coronary artery without angina pectoris: Secondary | ICD-10-CM | POA: Diagnosis not present

## 2019-03-29 DIAGNOSIS — N183 Chronic kidney disease, stage 3 unspecified: Secondary | ICD-10-CM | POA: Diagnosis not present

## 2019-03-29 DIAGNOSIS — I089 Rheumatic multiple valve disease, unspecified: Secondary | ICD-10-CM | POA: Diagnosis not present

## 2019-03-29 DIAGNOSIS — Z9181 History of falling: Secondary | ICD-10-CM | POA: Diagnosis not present

## 2019-03-29 DIAGNOSIS — M1990 Unspecified osteoarthritis, unspecified site: Secondary | ICD-10-CM | POA: Diagnosis not present

## 2019-03-29 DIAGNOSIS — I129 Hypertensive chronic kidney disease with stage 1 through stage 4 chronic kidney disease, or unspecified chronic kidney disease: Secondary | ICD-10-CM | POA: Diagnosis not present

## 2019-03-29 DIAGNOSIS — K219 Gastro-esophageal reflux disease without esophagitis: Secondary | ICD-10-CM | POA: Diagnosis not present

## 2019-03-29 DIAGNOSIS — M940 Chondrocostal junction syndrome [Tietze]: Secondary | ICD-10-CM | POA: Diagnosis not present

## 2019-03-31 DIAGNOSIS — E785 Hyperlipidemia, unspecified: Secondary | ICD-10-CM | POA: Diagnosis not present

## 2019-03-31 DIAGNOSIS — Z9181 History of falling: Secondary | ICD-10-CM | POA: Diagnosis not present

## 2019-03-31 DIAGNOSIS — D631 Anemia in chronic kidney disease: Secondary | ICD-10-CM | POA: Diagnosis not present

## 2019-03-31 DIAGNOSIS — K219 Gastro-esophageal reflux disease without esophagitis: Secondary | ICD-10-CM | POA: Diagnosis not present

## 2019-03-31 DIAGNOSIS — I089 Rheumatic multiple valve disease, unspecified: Secondary | ICD-10-CM | POA: Diagnosis not present

## 2019-03-31 DIAGNOSIS — I129 Hypertensive chronic kidney disease with stage 1 through stage 4 chronic kidney disease, or unspecified chronic kidney disease: Secondary | ICD-10-CM | POA: Diagnosis not present

## 2019-03-31 DIAGNOSIS — J45909 Unspecified asthma, uncomplicated: Secondary | ICD-10-CM | POA: Diagnosis not present

## 2019-03-31 DIAGNOSIS — Z96659 Presence of unspecified artificial knee joint: Secondary | ICD-10-CM | POA: Diagnosis not present

## 2019-03-31 DIAGNOSIS — I951 Orthostatic hypotension: Secondary | ICD-10-CM | POA: Diagnosis not present

## 2019-03-31 DIAGNOSIS — I2510 Atherosclerotic heart disease of native coronary artery without angina pectoris: Secondary | ICD-10-CM | POA: Diagnosis not present

## 2019-03-31 DIAGNOSIS — K859 Acute pancreatitis without necrosis or infection, unspecified: Secondary | ICD-10-CM | POA: Diagnosis not present

## 2019-03-31 DIAGNOSIS — N183 Chronic kidney disease, stage 3 unspecified: Secondary | ICD-10-CM | POA: Diagnosis not present

## 2019-03-31 DIAGNOSIS — M940 Chondrocostal junction syndrome [Tietze]: Secondary | ICD-10-CM | POA: Diagnosis not present

## 2019-03-31 DIAGNOSIS — M1990 Unspecified osteoarthritis, unspecified site: Secondary | ICD-10-CM | POA: Diagnosis not present

## 2019-03-31 DIAGNOSIS — G8929 Other chronic pain: Secondary | ICD-10-CM | POA: Diagnosis not present

## 2019-04-04 DIAGNOSIS — M1990 Unspecified osteoarthritis, unspecified site: Secondary | ICD-10-CM | POA: Diagnosis not present

## 2019-04-04 DIAGNOSIS — G8929 Other chronic pain: Secondary | ICD-10-CM | POA: Diagnosis not present

## 2019-04-04 DIAGNOSIS — M940 Chondrocostal junction syndrome [Tietze]: Secondary | ICD-10-CM | POA: Diagnosis not present

## 2019-04-04 DIAGNOSIS — I951 Orthostatic hypotension: Secondary | ICD-10-CM | POA: Diagnosis not present

## 2019-04-04 DIAGNOSIS — K859 Acute pancreatitis without necrosis or infection, unspecified: Secondary | ICD-10-CM | POA: Diagnosis not present

## 2019-04-04 DIAGNOSIS — Z96659 Presence of unspecified artificial knee joint: Secondary | ICD-10-CM | POA: Diagnosis not present

## 2019-04-04 DIAGNOSIS — I129 Hypertensive chronic kidney disease with stage 1 through stage 4 chronic kidney disease, or unspecified chronic kidney disease: Secondary | ICD-10-CM | POA: Diagnosis not present

## 2019-04-04 DIAGNOSIS — K219 Gastro-esophageal reflux disease without esophagitis: Secondary | ICD-10-CM | POA: Diagnosis not present

## 2019-04-04 DIAGNOSIS — I2510 Atherosclerotic heart disease of native coronary artery without angina pectoris: Secondary | ICD-10-CM | POA: Diagnosis not present

## 2019-04-04 DIAGNOSIS — D631 Anemia in chronic kidney disease: Secondary | ICD-10-CM | POA: Diagnosis not present

## 2019-04-04 DIAGNOSIS — N183 Chronic kidney disease, stage 3 unspecified: Secondary | ICD-10-CM | POA: Diagnosis not present

## 2019-04-04 DIAGNOSIS — E785 Hyperlipidemia, unspecified: Secondary | ICD-10-CM | POA: Diagnosis not present

## 2019-04-04 DIAGNOSIS — Z9181 History of falling: Secondary | ICD-10-CM | POA: Diagnosis not present

## 2019-04-04 DIAGNOSIS — I089 Rheumatic multiple valve disease, unspecified: Secondary | ICD-10-CM | POA: Diagnosis not present

## 2019-04-04 DIAGNOSIS — J45909 Unspecified asthma, uncomplicated: Secondary | ICD-10-CM | POA: Diagnosis not present

## 2019-04-05 DIAGNOSIS — I089 Rheumatic multiple valve disease, unspecified: Secondary | ICD-10-CM | POA: Diagnosis not present

## 2019-04-05 DIAGNOSIS — I2510 Atherosclerotic heart disease of native coronary artery without angina pectoris: Secondary | ICD-10-CM | POA: Diagnosis not present

## 2019-04-05 DIAGNOSIS — K219 Gastro-esophageal reflux disease without esophagitis: Secondary | ICD-10-CM | POA: Diagnosis not present

## 2019-04-05 DIAGNOSIS — M940 Chondrocostal junction syndrome [Tietze]: Secondary | ICD-10-CM | POA: Diagnosis not present

## 2019-04-05 DIAGNOSIS — D631 Anemia in chronic kidney disease: Secondary | ICD-10-CM | POA: Diagnosis not present

## 2019-04-05 DIAGNOSIS — E785 Hyperlipidemia, unspecified: Secondary | ICD-10-CM | POA: Diagnosis not present

## 2019-04-05 DIAGNOSIS — K859 Acute pancreatitis without necrosis or infection, unspecified: Secondary | ICD-10-CM | POA: Diagnosis not present

## 2019-04-05 DIAGNOSIS — I129 Hypertensive chronic kidney disease with stage 1 through stage 4 chronic kidney disease, or unspecified chronic kidney disease: Secondary | ICD-10-CM | POA: Diagnosis not present

## 2019-04-05 DIAGNOSIS — N183 Chronic kidney disease, stage 3 unspecified: Secondary | ICD-10-CM | POA: Diagnosis not present

## 2019-04-05 DIAGNOSIS — Z9181 History of falling: Secondary | ICD-10-CM | POA: Diagnosis not present

## 2019-04-05 DIAGNOSIS — M1990 Unspecified osteoarthritis, unspecified site: Secondary | ICD-10-CM | POA: Diagnosis not present

## 2019-04-05 DIAGNOSIS — J45909 Unspecified asthma, uncomplicated: Secondary | ICD-10-CM | POA: Diagnosis not present

## 2019-04-05 DIAGNOSIS — Z96659 Presence of unspecified artificial knee joint: Secondary | ICD-10-CM | POA: Diagnosis not present

## 2019-04-05 DIAGNOSIS — I951 Orthostatic hypotension: Secondary | ICD-10-CM | POA: Diagnosis not present

## 2019-04-05 DIAGNOSIS — G8929 Other chronic pain: Secondary | ICD-10-CM | POA: Diagnosis not present

## 2019-04-07 DIAGNOSIS — G8929 Other chronic pain: Secondary | ICD-10-CM | POA: Diagnosis not present

## 2019-04-07 DIAGNOSIS — J45909 Unspecified asthma, uncomplicated: Secondary | ICD-10-CM | POA: Diagnosis not present

## 2019-04-07 DIAGNOSIS — K219 Gastro-esophageal reflux disease without esophagitis: Secondary | ICD-10-CM | POA: Diagnosis not present

## 2019-04-07 DIAGNOSIS — M940 Chondrocostal junction syndrome [Tietze]: Secondary | ICD-10-CM | POA: Diagnosis not present

## 2019-04-07 DIAGNOSIS — D631 Anemia in chronic kidney disease: Secondary | ICD-10-CM | POA: Diagnosis not present

## 2019-04-07 DIAGNOSIS — Z96659 Presence of unspecified artificial knee joint: Secondary | ICD-10-CM | POA: Diagnosis not present

## 2019-04-07 DIAGNOSIS — I089 Rheumatic multiple valve disease, unspecified: Secondary | ICD-10-CM | POA: Diagnosis not present

## 2019-04-07 DIAGNOSIS — I129 Hypertensive chronic kidney disease with stage 1 through stage 4 chronic kidney disease, or unspecified chronic kidney disease: Secondary | ICD-10-CM | POA: Diagnosis not present

## 2019-04-07 DIAGNOSIS — K859 Acute pancreatitis without necrosis or infection, unspecified: Secondary | ICD-10-CM | POA: Diagnosis not present

## 2019-04-07 DIAGNOSIS — Z9181 History of falling: Secondary | ICD-10-CM | POA: Diagnosis not present

## 2019-04-07 DIAGNOSIS — N183 Chronic kidney disease, stage 3 unspecified: Secondary | ICD-10-CM | POA: Diagnosis not present

## 2019-04-07 DIAGNOSIS — M1990 Unspecified osteoarthritis, unspecified site: Secondary | ICD-10-CM | POA: Diagnosis not present

## 2019-04-07 DIAGNOSIS — E785 Hyperlipidemia, unspecified: Secondary | ICD-10-CM | POA: Diagnosis not present

## 2019-04-07 DIAGNOSIS — I951 Orthostatic hypotension: Secondary | ICD-10-CM | POA: Diagnosis not present

## 2019-04-07 DIAGNOSIS — I2510 Atherosclerotic heart disease of native coronary artery without angina pectoris: Secondary | ICD-10-CM | POA: Diagnosis not present

## 2019-04-10 DIAGNOSIS — K219 Gastro-esophageal reflux disease without esophagitis: Secondary | ICD-10-CM | POA: Diagnosis not present

## 2019-04-10 DIAGNOSIS — N183 Chronic kidney disease, stage 3 unspecified: Secondary | ICD-10-CM | POA: Diagnosis not present

## 2019-04-10 DIAGNOSIS — K859 Acute pancreatitis without necrosis or infection, unspecified: Secondary | ICD-10-CM | POA: Diagnosis not present

## 2019-04-10 DIAGNOSIS — Z96659 Presence of unspecified artificial knee joint: Secondary | ICD-10-CM | POA: Diagnosis not present

## 2019-04-10 DIAGNOSIS — D631 Anemia in chronic kidney disease: Secondary | ICD-10-CM | POA: Diagnosis not present

## 2019-04-10 DIAGNOSIS — I2510 Atherosclerotic heart disease of native coronary artery without angina pectoris: Secondary | ICD-10-CM | POA: Diagnosis not present

## 2019-04-10 DIAGNOSIS — J45909 Unspecified asthma, uncomplicated: Secondary | ICD-10-CM | POA: Diagnosis not present

## 2019-04-10 DIAGNOSIS — Z9181 History of falling: Secondary | ICD-10-CM | POA: Diagnosis not present

## 2019-04-10 DIAGNOSIS — I951 Orthostatic hypotension: Secondary | ICD-10-CM | POA: Diagnosis not present

## 2019-04-10 DIAGNOSIS — E785 Hyperlipidemia, unspecified: Secondary | ICD-10-CM | POA: Diagnosis not present

## 2019-04-10 DIAGNOSIS — G8929 Other chronic pain: Secondary | ICD-10-CM | POA: Diagnosis not present

## 2019-04-10 DIAGNOSIS — M1990 Unspecified osteoarthritis, unspecified site: Secondary | ICD-10-CM | POA: Diagnosis not present

## 2019-04-10 DIAGNOSIS — I129 Hypertensive chronic kidney disease with stage 1 through stage 4 chronic kidney disease, or unspecified chronic kidney disease: Secondary | ICD-10-CM | POA: Diagnosis not present

## 2019-04-10 DIAGNOSIS — M940 Chondrocostal junction syndrome [Tietze]: Secondary | ICD-10-CM | POA: Diagnosis not present

## 2019-04-10 DIAGNOSIS — I089 Rheumatic multiple valve disease, unspecified: Secondary | ICD-10-CM | POA: Diagnosis not present

## 2019-04-11 DIAGNOSIS — K859 Acute pancreatitis without necrosis or infection, unspecified: Secondary | ICD-10-CM | POA: Diagnosis not present

## 2019-04-11 DIAGNOSIS — Z9181 History of falling: Secondary | ICD-10-CM | POA: Diagnosis not present

## 2019-04-11 DIAGNOSIS — J45909 Unspecified asthma, uncomplicated: Secondary | ICD-10-CM | POA: Diagnosis not present

## 2019-04-11 DIAGNOSIS — M940 Chondrocostal junction syndrome [Tietze]: Secondary | ICD-10-CM | POA: Diagnosis not present

## 2019-04-11 DIAGNOSIS — I129 Hypertensive chronic kidney disease with stage 1 through stage 4 chronic kidney disease, or unspecified chronic kidney disease: Secondary | ICD-10-CM | POA: Diagnosis not present

## 2019-04-11 DIAGNOSIS — E785 Hyperlipidemia, unspecified: Secondary | ICD-10-CM | POA: Diagnosis not present

## 2019-04-11 DIAGNOSIS — Z96659 Presence of unspecified artificial knee joint: Secondary | ICD-10-CM | POA: Diagnosis not present

## 2019-04-11 DIAGNOSIS — M1990 Unspecified osteoarthritis, unspecified site: Secondary | ICD-10-CM | POA: Diagnosis not present

## 2019-04-11 DIAGNOSIS — I2510 Atherosclerotic heart disease of native coronary artery without angina pectoris: Secondary | ICD-10-CM | POA: Diagnosis not present

## 2019-04-11 DIAGNOSIS — I951 Orthostatic hypotension: Secondary | ICD-10-CM | POA: Diagnosis not present

## 2019-04-11 DIAGNOSIS — N183 Chronic kidney disease, stage 3 unspecified: Secondary | ICD-10-CM | POA: Diagnosis not present

## 2019-04-11 DIAGNOSIS — G8929 Other chronic pain: Secondary | ICD-10-CM | POA: Diagnosis not present

## 2019-04-11 DIAGNOSIS — K219 Gastro-esophageal reflux disease without esophagitis: Secondary | ICD-10-CM | POA: Diagnosis not present

## 2019-04-11 DIAGNOSIS — D631 Anemia in chronic kidney disease: Secondary | ICD-10-CM | POA: Diagnosis not present

## 2019-04-11 DIAGNOSIS — I089 Rheumatic multiple valve disease, unspecified: Secondary | ICD-10-CM | POA: Diagnosis not present

## 2019-04-12 DIAGNOSIS — K859 Acute pancreatitis without necrosis or infection, unspecified: Secondary | ICD-10-CM | POA: Diagnosis not present

## 2019-04-12 DIAGNOSIS — I089 Rheumatic multiple valve disease, unspecified: Secondary | ICD-10-CM | POA: Diagnosis not present

## 2019-04-12 DIAGNOSIS — K219 Gastro-esophageal reflux disease without esophagitis: Secondary | ICD-10-CM | POA: Diagnosis not present

## 2019-04-12 DIAGNOSIS — E785 Hyperlipidemia, unspecified: Secondary | ICD-10-CM | POA: Diagnosis not present

## 2019-04-12 DIAGNOSIS — D631 Anemia in chronic kidney disease: Secondary | ICD-10-CM | POA: Diagnosis not present

## 2019-04-12 DIAGNOSIS — N183 Chronic kidney disease, stage 3 unspecified: Secondary | ICD-10-CM | POA: Diagnosis not present

## 2019-04-12 DIAGNOSIS — J45909 Unspecified asthma, uncomplicated: Secondary | ICD-10-CM | POA: Diagnosis not present

## 2019-04-12 DIAGNOSIS — Z9181 History of falling: Secondary | ICD-10-CM | POA: Diagnosis not present

## 2019-04-12 DIAGNOSIS — Z96659 Presence of unspecified artificial knee joint: Secondary | ICD-10-CM | POA: Diagnosis not present

## 2019-04-12 DIAGNOSIS — I2510 Atherosclerotic heart disease of native coronary artery without angina pectoris: Secondary | ICD-10-CM | POA: Diagnosis not present

## 2019-04-12 DIAGNOSIS — I129 Hypertensive chronic kidney disease with stage 1 through stage 4 chronic kidney disease, or unspecified chronic kidney disease: Secondary | ICD-10-CM | POA: Diagnosis not present

## 2019-04-12 DIAGNOSIS — I951 Orthostatic hypotension: Secondary | ICD-10-CM | POA: Diagnosis not present

## 2019-04-12 DIAGNOSIS — M1990 Unspecified osteoarthritis, unspecified site: Secondary | ICD-10-CM | POA: Diagnosis not present

## 2019-04-12 DIAGNOSIS — G8929 Other chronic pain: Secondary | ICD-10-CM | POA: Diagnosis not present

## 2019-04-12 DIAGNOSIS — N390 Urinary tract infection, site not specified: Secondary | ICD-10-CM | POA: Diagnosis not present

## 2019-04-12 DIAGNOSIS — M940 Chondrocostal junction syndrome [Tietze]: Secondary | ICD-10-CM | POA: Diagnosis not present

## 2019-04-13 DIAGNOSIS — K219 Gastro-esophageal reflux disease without esophagitis: Secondary | ICD-10-CM | POA: Diagnosis not present

## 2019-04-13 DIAGNOSIS — Z96659 Presence of unspecified artificial knee joint: Secondary | ICD-10-CM | POA: Diagnosis not present

## 2019-04-13 DIAGNOSIS — N183 Chronic kidney disease, stage 3 unspecified: Secondary | ICD-10-CM | POA: Diagnosis not present

## 2019-04-13 DIAGNOSIS — Z9181 History of falling: Secondary | ICD-10-CM | POA: Diagnosis not present

## 2019-04-13 DIAGNOSIS — K859 Acute pancreatitis without necrosis or infection, unspecified: Secondary | ICD-10-CM | POA: Diagnosis not present

## 2019-04-13 DIAGNOSIS — M940 Chondrocostal junction syndrome [Tietze]: Secondary | ICD-10-CM | POA: Diagnosis not present

## 2019-04-13 DIAGNOSIS — D631 Anemia in chronic kidney disease: Secondary | ICD-10-CM | POA: Diagnosis not present

## 2019-04-13 DIAGNOSIS — I2510 Atherosclerotic heart disease of native coronary artery without angina pectoris: Secondary | ICD-10-CM | POA: Diagnosis not present

## 2019-04-13 DIAGNOSIS — M1990 Unspecified osteoarthritis, unspecified site: Secondary | ICD-10-CM | POA: Diagnosis not present

## 2019-04-13 DIAGNOSIS — I089 Rheumatic multiple valve disease, unspecified: Secondary | ICD-10-CM | POA: Diagnosis not present

## 2019-04-13 DIAGNOSIS — I129 Hypertensive chronic kidney disease with stage 1 through stage 4 chronic kidney disease, or unspecified chronic kidney disease: Secondary | ICD-10-CM | POA: Diagnosis not present

## 2019-04-13 DIAGNOSIS — I951 Orthostatic hypotension: Secondary | ICD-10-CM | POA: Diagnosis not present

## 2019-04-13 DIAGNOSIS — G8929 Other chronic pain: Secondary | ICD-10-CM | POA: Diagnosis not present

## 2019-04-13 DIAGNOSIS — E785 Hyperlipidemia, unspecified: Secondary | ICD-10-CM | POA: Diagnosis not present

## 2019-04-13 DIAGNOSIS — J45909 Unspecified asthma, uncomplicated: Secondary | ICD-10-CM | POA: Diagnosis not present

## 2019-04-17 DIAGNOSIS — I951 Orthostatic hypotension: Secondary | ICD-10-CM | POA: Diagnosis not present

## 2019-04-17 DIAGNOSIS — N183 Chronic kidney disease, stage 3 unspecified: Secondary | ICD-10-CM | POA: Diagnosis not present

## 2019-04-17 DIAGNOSIS — I129 Hypertensive chronic kidney disease with stage 1 through stage 4 chronic kidney disease, or unspecified chronic kidney disease: Secondary | ICD-10-CM | POA: Diagnosis not present

## 2019-04-17 DIAGNOSIS — K219 Gastro-esophageal reflux disease without esophagitis: Secondary | ICD-10-CM | POA: Diagnosis not present

## 2019-04-17 DIAGNOSIS — Z9181 History of falling: Secondary | ICD-10-CM | POA: Diagnosis not present

## 2019-04-17 DIAGNOSIS — D631 Anemia in chronic kidney disease: Secondary | ICD-10-CM | POA: Diagnosis not present

## 2019-04-17 DIAGNOSIS — I089 Rheumatic multiple valve disease, unspecified: Secondary | ICD-10-CM | POA: Diagnosis not present

## 2019-04-17 DIAGNOSIS — I2510 Atherosclerotic heart disease of native coronary artery without angina pectoris: Secondary | ICD-10-CM | POA: Diagnosis not present

## 2019-04-17 DIAGNOSIS — K859 Acute pancreatitis without necrosis or infection, unspecified: Secondary | ICD-10-CM | POA: Diagnosis not present

## 2019-04-17 DIAGNOSIS — M940 Chondrocostal junction syndrome [Tietze]: Secondary | ICD-10-CM | POA: Diagnosis not present

## 2019-04-17 DIAGNOSIS — J45909 Unspecified asthma, uncomplicated: Secondary | ICD-10-CM | POA: Diagnosis not present

## 2019-04-17 DIAGNOSIS — M1990 Unspecified osteoarthritis, unspecified site: Secondary | ICD-10-CM | POA: Diagnosis not present

## 2019-04-17 DIAGNOSIS — G8929 Other chronic pain: Secondary | ICD-10-CM | POA: Diagnosis not present

## 2019-04-17 DIAGNOSIS — Z96659 Presence of unspecified artificial knee joint: Secondary | ICD-10-CM | POA: Diagnosis not present

## 2019-04-17 DIAGNOSIS — E785 Hyperlipidemia, unspecified: Secondary | ICD-10-CM | POA: Diagnosis not present

## 2019-04-19 DIAGNOSIS — I129 Hypertensive chronic kidney disease with stage 1 through stage 4 chronic kidney disease, or unspecified chronic kidney disease: Secondary | ICD-10-CM | POA: Diagnosis not present

## 2019-04-19 DIAGNOSIS — E785 Hyperlipidemia, unspecified: Secondary | ICD-10-CM | POA: Diagnosis not present

## 2019-04-19 DIAGNOSIS — Z96659 Presence of unspecified artificial knee joint: Secondary | ICD-10-CM | POA: Diagnosis not present

## 2019-04-19 DIAGNOSIS — G8929 Other chronic pain: Secondary | ICD-10-CM | POA: Diagnosis not present

## 2019-04-19 DIAGNOSIS — M940 Chondrocostal junction syndrome [Tietze]: Secondary | ICD-10-CM | POA: Diagnosis not present

## 2019-04-19 DIAGNOSIS — I951 Orthostatic hypotension: Secondary | ICD-10-CM | POA: Diagnosis not present

## 2019-04-19 DIAGNOSIS — M1990 Unspecified osteoarthritis, unspecified site: Secondary | ICD-10-CM | POA: Diagnosis not present

## 2019-04-19 DIAGNOSIS — D631 Anemia in chronic kidney disease: Secondary | ICD-10-CM | POA: Diagnosis not present

## 2019-04-19 DIAGNOSIS — K859 Acute pancreatitis without necrosis or infection, unspecified: Secondary | ICD-10-CM | POA: Diagnosis not present

## 2019-04-19 DIAGNOSIS — Z9181 History of falling: Secondary | ICD-10-CM | POA: Diagnosis not present

## 2019-04-19 DIAGNOSIS — J45909 Unspecified asthma, uncomplicated: Secondary | ICD-10-CM | POA: Diagnosis not present

## 2019-04-19 DIAGNOSIS — K219 Gastro-esophageal reflux disease without esophagitis: Secondary | ICD-10-CM | POA: Diagnosis not present

## 2019-04-19 DIAGNOSIS — N183 Chronic kidney disease, stage 3 unspecified: Secondary | ICD-10-CM | POA: Diagnosis not present

## 2019-04-19 DIAGNOSIS — I2510 Atherosclerotic heart disease of native coronary artery without angina pectoris: Secondary | ICD-10-CM | POA: Diagnosis not present

## 2019-04-19 DIAGNOSIS — I089 Rheumatic multiple valve disease, unspecified: Secondary | ICD-10-CM | POA: Diagnosis not present

## 2019-04-20 DIAGNOSIS — D631 Anemia in chronic kidney disease: Secondary | ICD-10-CM | POA: Diagnosis not present

## 2019-04-20 DIAGNOSIS — M940 Chondrocostal junction syndrome [Tietze]: Secondary | ICD-10-CM | POA: Diagnosis not present

## 2019-04-20 DIAGNOSIS — I129 Hypertensive chronic kidney disease with stage 1 through stage 4 chronic kidney disease, or unspecified chronic kidney disease: Secondary | ICD-10-CM | POA: Diagnosis not present

## 2019-04-20 DIAGNOSIS — I089 Rheumatic multiple valve disease, unspecified: Secondary | ICD-10-CM | POA: Diagnosis not present

## 2019-04-20 DIAGNOSIS — K219 Gastro-esophageal reflux disease without esophagitis: Secondary | ICD-10-CM | POA: Diagnosis not present

## 2019-04-20 DIAGNOSIS — G8929 Other chronic pain: Secondary | ICD-10-CM | POA: Diagnosis not present

## 2019-04-20 DIAGNOSIS — M1990 Unspecified osteoarthritis, unspecified site: Secondary | ICD-10-CM | POA: Diagnosis not present

## 2019-04-20 DIAGNOSIS — Z96659 Presence of unspecified artificial knee joint: Secondary | ICD-10-CM | POA: Diagnosis not present

## 2019-04-20 DIAGNOSIS — Z9181 History of falling: Secondary | ICD-10-CM | POA: Diagnosis not present

## 2019-04-20 DIAGNOSIS — I951 Orthostatic hypotension: Secondary | ICD-10-CM | POA: Diagnosis not present

## 2019-04-20 DIAGNOSIS — N183 Chronic kidney disease, stage 3 unspecified: Secondary | ICD-10-CM | POA: Diagnosis not present

## 2019-04-20 DIAGNOSIS — E785 Hyperlipidemia, unspecified: Secondary | ICD-10-CM | POA: Diagnosis not present

## 2019-04-20 DIAGNOSIS — I2510 Atherosclerotic heart disease of native coronary artery without angina pectoris: Secondary | ICD-10-CM | POA: Diagnosis not present

## 2019-04-20 DIAGNOSIS — J45909 Unspecified asthma, uncomplicated: Secondary | ICD-10-CM | POA: Diagnosis not present

## 2019-04-20 DIAGNOSIS — K859 Acute pancreatitis without necrosis or infection, unspecified: Secondary | ICD-10-CM | POA: Diagnosis not present

## 2019-04-25 DIAGNOSIS — K859 Acute pancreatitis without necrosis or infection, unspecified: Secondary | ICD-10-CM | POA: Diagnosis not present

## 2019-04-25 DIAGNOSIS — K219 Gastro-esophageal reflux disease without esophagitis: Secondary | ICD-10-CM | POA: Diagnosis not present

## 2019-04-25 DIAGNOSIS — N183 Chronic kidney disease, stage 3 unspecified: Secondary | ICD-10-CM | POA: Diagnosis not present

## 2019-04-25 DIAGNOSIS — M940 Chondrocostal junction syndrome [Tietze]: Secondary | ICD-10-CM | POA: Diagnosis not present

## 2019-04-25 DIAGNOSIS — D631 Anemia in chronic kidney disease: Secondary | ICD-10-CM | POA: Diagnosis not present

## 2019-04-25 DIAGNOSIS — I2510 Atherosclerotic heart disease of native coronary artery without angina pectoris: Secondary | ICD-10-CM | POA: Diagnosis not present

## 2019-04-25 DIAGNOSIS — I089 Rheumatic multiple valve disease, unspecified: Secondary | ICD-10-CM | POA: Diagnosis not present

## 2019-04-25 DIAGNOSIS — Z96659 Presence of unspecified artificial knee joint: Secondary | ICD-10-CM | POA: Diagnosis not present

## 2019-04-25 DIAGNOSIS — G8929 Other chronic pain: Secondary | ICD-10-CM | POA: Diagnosis not present

## 2019-04-25 DIAGNOSIS — Z9181 History of falling: Secondary | ICD-10-CM | POA: Diagnosis not present

## 2019-04-25 DIAGNOSIS — E785 Hyperlipidemia, unspecified: Secondary | ICD-10-CM | POA: Diagnosis not present

## 2019-04-25 DIAGNOSIS — J45909 Unspecified asthma, uncomplicated: Secondary | ICD-10-CM | POA: Diagnosis not present

## 2019-04-25 DIAGNOSIS — M1990 Unspecified osteoarthritis, unspecified site: Secondary | ICD-10-CM | POA: Diagnosis not present

## 2019-04-25 DIAGNOSIS — I951 Orthostatic hypotension: Secondary | ICD-10-CM | POA: Diagnosis not present

## 2019-04-25 DIAGNOSIS — I129 Hypertensive chronic kidney disease with stage 1 through stage 4 chronic kidney disease, or unspecified chronic kidney disease: Secondary | ICD-10-CM | POA: Diagnosis not present

## 2019-04-26 DIAGNOSIS — N183 Chronic kidney disease, stage 3 unspecified: Secondary | ICD-10-CM | POA: Diagnosis not present

## 2019-04-26 DIAGNOSIS — I129 Hypertensive chronic kidney disease with stage 1 through stage 4 chronic kidney disease, or unspecified chronic kidney disease: Secondary | ICD-10-CM | POA: Diagnosis not present

## 2019-04-26 DIAGNOSIS — E785 Hyperlipidemia, unspecified: Secondary | ICD-10-CM | POA: Diagnosis not present

## 2019-04-26 DIAGNOSIS — K219 Gastro-esophageal reflux disease without esophagitis: Secondary | ICD-10-CM | POA: Diagnosis not present

## 2019-04-26 DIAGNOSIS — I951 Orthostatic hypotension: Secondary | ICD-10-CM | POA: Diagnosis not present

## 2019-04-26 DIAGNOSIS — I089 Rheumatic multiple valve disease, unspecified: Secondary | ICD-10-CM | POA: Diagnosis not present

## 2019-04-26 DIAGNOSIS — D631 Anemia in chronic kidney disease: Secondary | ICD-10-CM | POA: Diagnosis not present

## 2019-04-26 DIAGNOSIS — J309 Allergic rhinitis, unspecified: Secondary | ICD-10-CM | POA: Diagnosis not present

## 2019-04-26 DIAGNOSIS — K859 Acute pancreatitis without necrosis or infection, unspecified: Secondary | ICD-10-CM | POA: Diagnosis not present

## 2019-04-26 DIAGNOSIS — Z9181 History of falling: Secondary | ICD-10-CM | POA: Diagnosis not present

## 2019-04-26 DIAGNOSIS — G8929 Other chronic pain: Secondary | ICD-10-CM | POA: Diagnosis not present

## 2019-04-26 DIAGNOSIS — H6590 Unspecified nonsuppurative otitis media, unspecified ear: Secondary | ICD-10-CM | POA: Diagnosis not present

## 2019-04-26 DIAGNOSIS — I2510 Atherosclerotic heart disease of native coronary artery without angina pectoris: Secondary | ICD-10-CM | POA: Diagnosis not present

## 2019-04-26 DIAGNOSIS — M940 Chondrocostal junction syndrome [Tietze]: Secondary | ICD-10-CM | POA: Diagnosis not present

## 2019-04-26 DIAGNOSIS — J45909 Unspecified asthma, uncomplicated: Secondary | ICD-10-CM | POA: Diagnosis not present

## 2019-04-26 DIAGNOSIS — Z96659 Presence of unspecified artificial knee joint: Secondary | ICD-10-CM | POA: Diagnosis not present

## 2019-04-26 DIAGNOSIS — J329 Chronic sinusitis, unspecified: Secondary | ICD-10-CM | POA: Diagnosis not present

## 2019-04-26 DIAGNOSIS — M1990 Unspecified osteoarthritis, unspecified site: Secondary | ICD-10-CM | POA: Diagnosis not present

## 2019-04-27 DIAGNOSIS — N183 Chronic kidney disease, stage 3 unspecified: Secondary | ICD-10-CM | POA: Diagnosis not present

## 2019-04-27 DIAGNOSIS — M940 Chondrocostal junction syndrome [Tietze]: Secondary | ICD-10-CM | POA: Diagnosis not present

## 2019-04-27 DIAGNOSIS — I951 Orthostatic hypotension: Secondary | ICD-10-CM | POA: Diagnosis not present

## 2019-04-27 DIAGNOSIS — Z9181 History of falling: Secondary | ICD-10-CM | POA: Diagnosis not present

## 2019-04-27 DIAGNOSIS — Z96659 Presence of unspecified artificial knee joint: Secondary | ICD-10-CM | POA: Diagnosis not present

## 2019-04-27 DIAGNOSIS — I089 Rheumatic multiple valve disease, unspecified: Secondary | ICD-10-CM | POA: Diagnosis not present

## 2019-04-27 DIAGNOSIS — I129 Hypertensive chronic kidney disease with stage 1 through stage 4 chronic kidney disease, or unspecified chronic kidney disease: Secondary | ICD-10-CM | POA: Diagnosis not present

## 2019-04-27 DIAGNOSIS — K859 Acute pancreatitis without necrosis or infection, unspecified: Secondary | ICD-10-CM | POA: Diagnosis not present

## 2019-04-27 DIAGNOSIS — G8929 Other chronic pain: Secondary | ICD-10-CM | POA: Diagnosis not present

## 2019-04-27 DIAGNOSIS — E785 Hyperlipidemia, unspecified: Secondary | ICD-10-CM | POA: Diagnosis not present

## 2019-04-27 DIAGNOSIS — D631 Anemia in chronic kidney disease: Secondary | ICD-10-CM | POA: Diagnosis not present

## 2019-04-27 DIAGNOSIS — J45909 Unspecified asthma, uncomplicated: Secondary | ICD-10-CM | POA: Diagnosis not present

## 2019-04-27 DIAGNOSIS — I2510 Atherosclerotic heart disease of native coronary artery without angina pectoris: Secondary | ICD-10-CM | POA: Diagnosis not present

## 2019-04-27 DIAGNOSIS — K219 Gastro-esophageal reflux disease without esophagitis: Secondary | ICD-10-CM | POA: Diagnosis not present

## 2019-04-27 DIAGNOSIS — M1990 Unspecified osteoarthritis, unspecified site: Secondary | ICD-10-CM | POA: Diagnosis not present

## 2019-05-02 DIAGNOSIS — K859 Acute pancreatitis without necrosis or infection, unspecified: Secondary | ICD-10-CM | POA: Diagnosis not present

## 2019-05-02 DIAGNOSIS — M940 Chondrocostal junction syndrome [Tietze]: Secondary | ICD-10-CM | POA: Diagnosis not present

## 2019-05-02 DIAGNOSIS — I2510 Atherosclerotic heart disease of native coronary artery without angina pectoris: Secondary | ICD-10-CM | POA: Diagnosis not present

## 2019-05-02 DIAGNOSIS — N183 Chronic kidney disease, stage 3 unspecified: Secondary | ICD-10-CM | POA: Diagnosis not present

## 2019-05-02 DIAGNOSIS — M1990 Unspecified osteoarthritis, unspecified site: Secondary | ICD-10-CM | POA: Diagnosis not present

## 2019-05-02 DIAGNOSIS — I129 Hypertensive chronic kidney disease with stage 1 through stage 4 chronic kidney disease, or unspecified chronic kidney disease: Secondary | ICD-10-CM | POA: Diagnosis not present

## 2019-05-02 DIAGNOSIS — J45909 Unspecified asthma, uncomplicated: Secondary | ICD-10-CM | POA: Diagnosis not present

## 2019-05-02 DIAGNOSIS — I951 Orthostatic hypotension: Secondary | ICD-10-CM | POA: Diagnosis not present

## 2019-05-02 DIAGNOSIS — E785 Hyperlipidemia, unspecified: Secondary | ICD-10-CM | POA: Diagnosis not present

## 2019-05-02 DIAGNOSIS — Z9181 History of falling: Secondary | ICD-10-CM | POA: Diagnosis not present

## 2019-05-02 DIAGNOSIS — I089 Rheumatic multiple valve disease, unspecified: Secondary | ICD-10-CM | POA: Diagnosis not present

## 2019-05-02 DIAGNOSIS — G8929 Other chronic pain: Secondary | ICD-10-CM | POA: Diagnosis not present

## 2019-05-02 DIAGNOSIS — D631 Anemia in chronic kidney disease: Secondary | ICD-10-CM | POA: Diagnosis not present

## 2019-05-02 DIAGNOSIS — K219 Gastro-esophageal reflux disease without esophagitis: Secondary | ICD-10-CM | POA: Diagnosis not present

## 2019-05-02 DIAGNOSIS — Z96659 Presence of unspecified artificial knee joint: Secondary | ICD-10-CM | POA: Diagnosis not present

## 2019-05-03 DIAGNOSIS — I951 Orthostatic hypotension: Secondary | ICD-10-CM | POA: Diagnosis not present

## 2019-05-03 DIAGNOSIS — J45909 Unspecified asthma, uncomplicated: Secondary | ICD-10-CM | POA: Diagnosis not present

## 2019-05-03 DIAGNOSIS — I129 Hypertensive chronic kidney disease with stage 1 through stage 4 chronic kidney disease, or unspecified chronic kidney disease: Secondary | ICD-10-CM | POA: Diagnosis not present

## 2019-05-03 DIAGNOSIS — M940 Chondrocostal junction syndrome [Tietze]: Secondary | ICD-10-CM | POA: Diagnosis not present

## 2019-05-03 DIAGNOSIS — K219 Gastro-esophageal reflux disease without esophagitis: Secondary | ICD-10-CM | POA: Diagnosis not present

## 2019-05-03 DIAGNOSIS — I089 Rheumatic multiple valve disease, unspecified: Secondary | ICD-10-CM | POA: Diagnosis not present

## 2019-05-03 DIAGNOSIS — E785 Hyperlipidemia, unspecified: Secondary | ICD-10-CM | POA: Diagnosis not present

## 2019-05-03 DIAGNOSIS — D631 Anemia in chronic kidney disease: Secondary | ICD-10-CM | POA: Diagnosis not present

## 2019-05-03 DIAGNOSIS — G8929 Other chronic pain: Secondary | ICD-10-CM | POA: Diagnosis not present

## 2019-05-03 DIAGNOSIS — N183 Chronic kidney disease, stage 3 unspecified: Secondary | ICD-10-CM | POA: Diagnosis not present

## 2019-05-03 DIAGNOSIS — Z96659 Presence of unspecified artificial knee joint: Secondary | ICD-10-CM | POA: Diagnosis not present

## 2019-05-03 DIAGNOSIS — M1990 Unspecified osteoarthritis, unspecified site: Secondary | ICD-10-CM | POA: Diagnosis not present

## 2019-05-03 DIAGNOSIS — K859 Acute pancreatitis without necrosis or infection, unspecified: Secondary | ICD-10-CM | POA: Diagnosis not present

## 2019-05-03 DIAGNOSIS — I2510 Atherosclerotic heart disease of native coronary artery without angina pectoris: Secondary | ICD-10-CM | POA: Diagnosis not present

## 2019-05-03 DIAGNOSIS — Z9181 History of falling: Secondary | ICD-10-CM | POA: Diagnosis not present

## 2019-05-05 DIAGNOSIS — Z9181 History of falling: Secondary | ICD-10-CM | POA: Diagnosis not present

## 2019-05-05 DIAGNOSIS — Z96659 Presence of unspecified artificial knee joint: Secondary | ICD-10-CM | POA: Diagnosis not present

## 2019-05-05 DIAGNOSIS — J45909 Unspecified asthma, uncomplicated: Secondary | ICD-10-CM | POA: Diagnosis not present

## 2019-05-05 DIAGNOSIS — I129 Hypertensive chronic kidney disease with stage 1 through stage 4 chronic kidney disease, or unspecified chronic kidney disease: Secondary | ICD-10-CM | POA: Diagnosis not present

## 2019-05-05 DIAGNOSIS — K219 Gastro-esophageal reflux disease without esophagitis: Secondary | ICD-10-CM | POA: Diagnosis not present

## 2019-05-05 DIAGNOSIS — D631 Anemia in chronic kidney disease: Secondary | ICD-10-CM | POA: Diagnosis not present

## 2019-05-05 DIAGNOSIS — E785 Hyperlipidemia, unspecified: Secondary | ICD-10-CM | POA: Diagnosis not present

## 2019-05-05 DIAGNOSIS — M940 Chondrocostal junction syndrome [Tietze]: Secondary | ICD-10-CM | POA: Diagnosis not present

## 2019-05-05 DIAGNOSIS — K859 Acute pancreatitis without necrosis or infection, unspecified: Secondary | ICD-10-CM | POA: Diagnosis not present

## 2019-05-05 DIAGNOSIS — I2510 Atherosclerotic heart disease of native coronary artery without angina pectoris: Secondary | ICD-10-CM | POA: Diagnosis not present

## 2019-05-05 DIAGNOSIS — N183 Chronic kidney disease, stage 3 unspecified: Secondary | ICD-10-CM | POA: Diagnosis not present

## 2019-05-05 DIAGNOSIS — I951 Orthostatic hypotension: Secondary | ICD-10-CM | POA: Diagnosis not present

## 2019-05-05 DIAGNOSIS — M1990 Unspecified osteoarthritis, unspecified site: Secondary | ICD-10-CM | POA: Diagnosis not present

## 2019-05-05 DIAGNOSIS — I089 Rheumatic multiple valve disease, unspecified: Secondary | ICD-10-CM | POA: Diagnosis not present

## 2019-05-05 DIAGNOSIS — G8929 Other chronic pain: Secondary | ICD-10-CM | POA: Diagnosis not present

## 2019-05-19 DIAGNOSIS — I129 Hypertensive chronic kidney disease with stage 1 through stage 4 chronic kidney disease, or unspecified chronic kidney disease: Secondary | ICD-10-CM | POA: Diagnosis not present

## 2019-05-19 DIAGNOSIS — E559 Vitamin D deficiency, unspecified: Secondary | ICD-10-CM | POA: Diagnosis not present

## 2019-05-19 DIAGNOSIS — G309 Alzheimer's disease, unspecified: Secondary | ICD-10-CM | POA: Diagnosis not present

## 2019-05-19 DIAGNOSIS — D649 Anemia, unspecified: Secondary | ICD-10-CM | POA: Diagnosis not present

## 2019-05-19 DIAGNOSIS — R7303 Prediabetes: Secondary | ICD-10-CM | POA: Diagnosis not present

## 2019-05-19 DIAGNOSIS — E7800 Pure hypercholesterolemia, unspecified: Secondary | ICD-10-CM | POA: Diagnosis not present

## 2019-06-07 DIAGNOSIS — D649 Anemia, unspecified: Secondary | ICD-10-CM | POA: Diagnosis not present

## 2019-06-13 DIAGNOSIS — R1310 Dysphagia, unspecified: Secondary | ICD-10-CM | POA: Diagnosis not present

## 2019-06-13 DIAGNOSIS — D649 Anemia, unspecified: Secondary | ICD-10-CM | POA: Diagnosis not present

## 2019-06-13 DIAGNOSIS — R195 Other fecal abnormalities: Secondary | ICD-10-CM | POA: Diagnosis not present

## 2019-06-13 DIAGNOSIS — K219 Gastro-esophageal reflux disease without esophagitis: Secondary | ICD-10-CM | POA: Diagnosis not present

## 2019-06-13 NOTE — Progress Notes (Signed)
Cardiology Office Note:    Date:  06/14/2019   ID:  Sharon Wise, DOB Sep 03, 1950, MRN [id number]  PCP:  Nicoletta Dress, MD  Cardiologist:  Shirlee More, MD    Referring MD: Nicoletta Dress, MD    ASSESSMENT:    1. Atherosclerosis of native coronary artery of native heart without angina pectoris   2. 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 Mixed hyperlipidemia   4. Shortness of breath    PLAN:    In order of problems listed above:  1. I do not think that her shortness of breath is related to heart disease.  Further evaluation chest x-ray full PFTs blood gas diffusion capacity follow-up with the results and likely have her follow-up with her PCP unless is distinctly abnormal then she would benefit from pulmonary consultation.  She has atrial premature contractions asymptomatic.  I would ask her to continue her statin. 2. Stable continue current antihypertensives 3. Continue her current statin.  Her most recent lipid profile 05/19/2019 shows lipids at target cholesterol 174 HDL 76 LDL 81 and creatinine at that time was 1.09 mildly elevated. 4. See above x-ray blood gas PFTs ordered   Next appointment: As needed   Medication Adjustments/Labs and Tests Ordered: Current medicines are reviewed at length with the patient today.  Concerns regarding medicines are outlined above.  No orders of the defined types were placed in this encounter.  No orders of the defined types were placed in this encounter.   Chief Complaint  Patient presents with  . Follow-up    3 MO FU     History of Present Illness:    Sharon Wise is a 69 y.o. female with a hx of HTN, CKDIII, previous chest pain s/p normal pharmacologic myocardial perfusion study Aug 2019 last seen 03/17/2019.  Cardiac CTA showed a calcium score of 0.4 and no obstructive CAD with minimal plaque in the proximal right coronary artery. Compliance  with diet, lifestyle and medications: Yes  I reviewed her cardiac CTA with her score approaches 0 and she has minimal atherosclerosis single coronary artery. She asked me why she still was so short of breath.  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  Ordered chest x-ray full PFTs with diffusion capacity arterial blood gas and I told her I will look at the results and make a decision if she needs to come back with me or follow-up with her PCP.  She is not having angina edema orthopnea palpitation or syncope. She surprised me by asking if I spoke with Dr. Melina Copa.  She was under the impression that we had a discussion yesterday. Past Medical History:  Diagnosis Date  . Adhesive capsulitis of right shoulder 03/31/2016  . AKI (acute kidney injury) ([place]) 03/28/2015  . 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 Angina pectoris ([place]) 03/24/2018   She was seen at [facility] cardiology in [place] in 2019 for chest pain she had a myocardial perfusion study done pharmacologically I cannot obtain the report but there is a notation in the office visit 03/24/2018 that was normal.  . Anxiety disorder 12/09/2015  . Arthritis   . Carpal tunnel syndrome of right wrist 06/25/2017  . Chronic bilateral low back pain without sciatica 04/03/2015  . Chronic kidney disease   .  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 Hyperlipidemia 05/13/2018  . Hypertension   . Hypertensive chronic kidney disease 05/12/2016  . Impingement syndrome of right shoulder 03/31/2016  . Late onset Alzheimer's disease without behavioral disturbance ([place]) 04/03/2015  . Lumbar spondylosis 11/09/2018  . Metabolic bone disease 01/24/1094  . Pain due to internal orthopedic prosthetic devices, implants and grafts, subsequent encounter 07/09/2017  . Pain due to total left knee replacement ([place]) 12/04/2015  . Presence of left artificial  knee joint 07/09/2017  . Primary osteoarthritis of both knees 12/03/2015  . Primary osteoarthritis of right knee 12/04/2015  . Tremor   . Vitamin D deficiency 03/28/2015    Past Surgical History:  Procedure Laterality Date  . CARPAL TUNNEL RELEASE    . FOOT SURGERY    . JOINT REPLACEMENT    . REPLACEMENT UNICONDYLAR JOINT KNEE    . VAGINAL HYSTERECTOMY      Current Medications: Current Meds  Medication Sig  . ADVAIR HFA 230-21 MCG/ACT inhaler Inhale 1 puff into the lungs every 12 (twelve) hours.  Marland Kitchen allopurinol (ZYLOPRIM) 100 MG tablet Take 100 mg by mouth daily.  . celecoxib (CELEBREX) 100 MG capsule Take 100 mg by mouth 2 (two) times daily.  . cetirizine (ZYRTEC) 10 MG tablet Take 10 mg by mouth daily.  . citalopram (CELEXA) 20 MG tablet Take 20 mg by mouth daily.  . clonazePAM (KLONOPIN) 0.5 MG tablet Take 0.5 mg by mouth 2 (two) times daily.   Marland Kitchen donepezil (ARICEPT ODT) 10 MG disintegrating tablet Take 10 mg by mouth daily.  . famotidine (PEPCID) 40 MG tablet Take 40 mg by mouth daily.  . hydrALAZINE (APRESOLINE) 25 MG tablet Take 12.5 mg by mouth 3 (three) times daily.  . isosorbide mononitrate (IMDUR) 30 MG 24 hr tablet Take 1 tablet (30 mg total) by mouth daily.  . memantine (NAMENDA) 10 MG tablet Take 10 mg by mouth 2 (two) times daily.  . memantine (NAMENDA) 5 MG tablet Take 5 mg by mouth 2 (two) times daily.  . metoprolol tartrate (LOPRESSOR) 25 MG tablet TAKE ONE TABLET BY MOUTH TWICE DAILY  . nitrofurantoin, macrocrystal-monohydrate, (MACROBID) 100 MG capsule Take 100 mg by mouth 2 (two) times daily.  . nitroGLYCERIN (NITROSTAT) 0.4 MG SL tablet Place 1 tablet (0.4 mg total) under the tongue every 5 (five) minutes as needed for up to 30 days for chest pain.  Marland Kitchen omeprazole (PRILOSEC) 20 MG capsule Take 20 mg by mouth daily.  Marland Kitchen oxybutynin (DITROPAN-XL) 5 MG 24 hr tablet Take 5 mg by mouth daily.  . pantoprazole (PROTONIX) 40 MG tablet Take 40 mg by mouth daily.  . potassium  chloride SA (K-DUR) 20 MEQ tablet Take 20 mEq by mouth every evening.  . pravastatin (PRAVACHOL) 40 MG tablet Take 20 mg by mouth daily.   . traZODone (DESYREL) 50 MG tablet Take 50 mg by mouth at bedtime.  . Vitamin D, Ergocalciferol, 50 MCG (2000 UT) CAPS Take 50 mcg by mouth daily.  . Wheat Dextrin (BENEFIBER) POWD Take 1 (one) tablespoon(s) daily     Allergies:   Meloxicam   Social History   Socioeconomic History  . Marital status: Divorced    Spouse name: Not on file  . Number of children: Not on file  . Years of education: Not on file  . Highest education level: Not on file  Occupational History  . Not on file  Tobacco Use  . Smoking status: Never Smoker  .  Smokeless tobacco: Never Used  Substance and Sexual Activity  . Alcohol use: Never  . Drug use: Never  . Sexual activity: Not Currently  Other Topics Concern  . Not on file  Social History Narrative  . Not on file   Social Determinants of Health   Financial Resource Strain:   . Difficulty of Paying Living Expenses:   Food Insecurity:   . Worried About Charity fundraiser in the Last Year:   . Arboriculturist in the Last Year:   Transportation Needs:   . Film/video editor (Medical):   Marland Kitchen Lack of Transportation (Non-Medical):   Physical Activity:   . Days of Exercise per Week:   . Minutes of Exercise per Session:   Stress:   . Feeling of Stress :   Social Connections:   . Frequency of Communication with Friends and Family:   . Frequency of Social Gatherings with Friends and Family:   . Attends Religious Services:   . Active Member of Clubs or Organizations:   . Attends Archivist Meetings:   Marland Kitchen Marital Status:      Family History: The patient's family history includes Cancer in her sister and sister; Heart disease in her father and mother; Hypertension in her mother. ROS:   Please see the history of present illness.    All other systems reviewed and are negative.  EKGs/Labs/Other  Studies Reviewed:    The following studies were reviewed today:  EKG:  EKG ordered today and personally reviewed.  The ekg ordered today demonstrates sinus rhythm with APCs  Recent Labs: 07/25/2018: Creatinine, Ser 1.30  Recent Lipid Panel    Component Value Date/Time   CHOL  05/01/2008 0810    155        ATP III CLASSIFICATION:  <200     mg/dL   Desirable  200-239  mg/dL   Borderline High  >=240    mg/dL   High          TRIG 115 05/01/2008 1826   HDL 55 05/01/2008 0810   CHOLHDL 2.8 05/01/2008 0810   VLDL 22 05/01/2008 0810   LDLCALC  05/01/2008 0810    78        Total Cholesterol/HDL:CHD Risk Coronary Heart Disease Risk Table                     Men   Women  1/2 Average Risk   3.4   3.3  Average Risk       5.0   4.4  2 X Average Risk   9.6   7.1  3 X Average Risk  23.4   11.0        Use the calculated Patient Ratio above and the CHD Risk Table to determine the patient's CHD Risk.        ATP III CLASSIFICATION (LDL):  <100     mg/dL   Optimal  100-129  mg/dL   Near or Above                    Optimal  130-159  mg/dL   Borderline  160-189  mg/dL   High  >190     mg/dL   Very High    Physical Exam:    VS:  BP 126/68   Pulse (!) 102   Ht 5\' 2"  (1.575 m)   Wt 169 lb 3.2 oz (76.7 kg)   LMP  (LMP Unknown)  SpO2 96%   BMI 30.95 kg/m     Wt Readings from Last 3 Encounters:  06/14/19 169 lb 3.2 oz (76.7 kg)  03/17/19 168 lb (76.2 kg)  08/25/18 164 lb (74.4 kg)     GEN:  Well nourished, well developed in no acute distress HEENT: Normal NECK: No JVD; No carotid bruits LYMPHATICS: No lymphadenopathy CARDIAC: RRR, no murmurs, rubs, gallops RESPIRATORY:  Clear to auscultation without rales, wheezing or rhonchi  ABDOMEN: Soft, non-tender, non-distended MUSCULOSKELETAL:  No edema; No deformity  SKIN: Warm and dry NEUROLOGIC:  Alert and oriented x 3 PSYCHIATRIC:  Normal affect    Signed, Shirlee More, MD  06/14/2019 8:30 AM    [place]

## 2019-06-14 ENCOUNTER — Ambulatory Visit: Payer: Medicare Other | Admitting: Cardiology

## 2019-06-14 ENCOUNTER — Telehealth: Payer: Self-pay | Admitting: *Deleted

## 2019-06-14 ENCOUNTER — Other Ambulatory Visit: Payer: Self-pay

## 2019-06-14 ENCOUNTER — Encounter: Payer: Self-pay | Admitting: Cardiology

## 2019-06-14 VITALS — BP 126/68 | HR 102 | Ht 62.0 in | Wt 169.2 lb

## 2019-06-14 DIAGNOSIS — I2510 Atherosclerotic heart disease of native coronary artery without angina pectoris: Secondary | ICD-10-CM

## 2019-06-14 DIAGNOSIS — R0602 Shortness of breath: Secondary | ICD-10-CM | POA: Diagnosis not present

## 2019-06-14 DIAGNOSIS — I129 Hypertensive chronic kidney disease with stage 1 through stage 4 chronic kidney disease, or unspecified chronic kidney disease: Secondary | ICD-10-CM

## 2019-06-14 DIAGNOSIS — N183 Chronic kidney disease, stage 3 unspecified: Secondary | ICD-10-CM

## 2019-06-14 DIAGNOSIS — E782 Mixed hyperlipidemia: Secondary | ICD-10-CM

## 2019-06-14 NOTE — Telephone Encounter (Signed)
Made appt for pt to get blood gas and PFT at [facility] on June 1st at 10:00, be there at 9:30. Pt cannot have any caffeine, smoking or inhalers 6 hours prior.Left message on pt voicemail.

## 2019-06-14 NOTE — Patient Instructions (Signed)
Medication Instructions:  Your physician recommends that you continue on your current medications as directed. Please refer to the Current Medication list given to you today.  *If you need a refill on your cardiac medications before your next appointment, please call your pharmacy*   Lab Work: None If you have labs (blood work) drawn today and your tests are completely normal, you will receive your results only by: Marland Kitchen MyChart Message (if you have MyChart) OR . A paper copy in the mail If you have any lab test that is abnormal or we need to change your treatment, we will call you to review the results.   Testing/Procedures: We have ordered for you to have a PFT and a ABG done at [facility] will get a call with these tests scheduled for you. We have also given you the order for you to get a chest x-ray done at [place] can go there today to get this done.    Follow-Up: At [facility], you and your health needs are our priority.  As part of our continuing mission to provide you with exceptional heart care, we have created designated Provider Care Teams.  These Care Teams include your primary Cardiologist (physician) and Advanced Practice Providers (APPs -  Physician Assistants and Nurse Practitioners) who all work together to provide you with the care you need, when you need it.  We recommend signing up for the patient portal called "MyChart".  Sign up information is provided on this After Visit Summary.  MyChart is used to connect with patients for Virtual Visits (Telemedicine).  Patients are able to view lab/test results, encounter notes, upcoming appointments, etc.  Non-urgent messages can be sent to your provider as well.   To learn more about what you can do with MyChart, go to [email or web address].    Your next appointment:   As needed  The format for your next appointment:   In Person  Provider:   Shirlee More, MD   Other Instructions

## 2019-06-15 ENCOUNTER — Telehealth: Payer: Self-pay | Admitting: *Deleted

## 2019-06-15 DIAGNOSIS — R1310 Dysphagia, unspecified: Secondary | ICD-10-CM | POA: Diagnosis not present

## 2019-06-15 DIAGNOSIS — R0602 Shortness of breath: Secondary | ICD-10-CM | POA: Diagnosis not present

## 2019-06-15 DIAGNOSIS — K219 Gastro-esophageal reflux disease without esophagitis: Secondary | ICD-10-CM | POA: Diagnosis not present

## 2019-06-15 NOTE — Telephone Encounter (Signed)
Talked with pt's sister and informed her of pt's appt at [facility] for Arterial Blood Gas and Pulmonary Function test on 06/20/19 at 10.00 AM. Will fax order over now.

## 2019-06-20 DIAGNOSIS — R0602 Shortness of breath: Secondary | ICD-10-CM | POA: Diagnosis not present

## 2019-06-20 LAB — PULMONARY FUNCTION TEST

## 2019-06-23 ENCOUNTER — Telehealth: Payer: Self-pay | Admitting: Cardiology

## 2019-06-23 NOTE — Telephone Encounter (Signed)
Sharon Wise is calling wanting to know if her results have came back from her tests that she had performed at [facility] on 06/20/19. Please advise.

## 2019-06-23 NOTE — Telephone Encounter (Signed)
Spoke to the patient just now and let her know that the results for this test are sitting on Dr. Joya Gaskins desk but he is out of town this week so he will not be able to review them until at least Monday morning. She verbalizes understanding and is thankful for the call. No other issues or concerns were noted at this time.    Encouraged patient to call back with any questions or concerns.

## 2019-07-03 ENCOUNTER — Telehealth: Payer: Self-pay

## 2019-07-03 NOTE — Telephone Encounter (Signed)
Spoke with patient regarding PFT and Blood gas results and recommendation. These tests all came back normal.  Patient verbalizes understanding and is agreeable to plan of care. Advised patient to call back with any issues or concerns.

## 2019-07-05 DIAGNOSIS — G301 Alzheimer's disease with late onset: Secondary | ICD-10-CM | POA: Diagnosis not present

## 2019-07-05 DIAGNOSIS — M47816 Spondylosis without myelopathy or radiculopathy, lumbar region: Secondary | ICD-10-CM | POA: Diagnosis not present

## 2019-07-05 DIAGNOSIS — M6281 Muscle weakness (generalized): Secondary | ICD-10-CM | POA: Diagnosis not present

## 2019-07-05 DIAGNOSIS — M4802 Spinal stenosis, cervical region: Secondary | ICD-10-CM | POA: Diagnosis not present

## 2019-07-18 DIAGNOSIS — M47816 Spondylosis without myelopathy or radiculopathy, lumbar region: Secondary | ICD-10-CM | POA: Diagnosis not present

## 2019-07-21 DIAGNOSIS — M6281 Muscle weakness (generalized): Secondary | ICD-10-CM | POA: Diagnosis not present

## 2019-07-21 DIAGNOSIS — M4722 Other spondylosis with radiculopathy, cervical region: Secondary | ICD-10-CM | POA: Diagnosis not present

## 2019-07-21 DIAGNOSIS — M5011 Cervical disc disorder with radiculopathy,  high cervical region: Secondary | ICD-10-CM | POA: Diagnosis not present

## 2019-07-21 DIAGNOSIS — M50121 Cervical disc disorder at C4-C5 level with radiculopathy: Secondary | ICD-10-CM | POA: Diagnosis not present

## 2019-07-21 DIAGNOSIS — M4802 Spinal stenosis, cervical region: Secondary | ICD-10-CM | POA: Diagnosis not present

## 2019-07-21 DIAGNOSIS — G301 Alzheimer's disease with late onset: Secondary | ICD-10-CM | POA: Diagnosis not present

## 2019-07-21 DIAGNOSIS — M47816 Spondylosis without myelopathy or radiculopathy, lumbar region: Secondary | ICD-10-CM | POA: Diagnosis not present

## 2019-08-17 DIAGNOSIS — J329 Chronic sinusitis, unspecified: Secondary | ICD-10-CM | POA: Diagnosis not present

## 2019-08-17 DIAGNOSIS — Z20822 Contact with and (suspected) exposure to covid-19: Secondary | ICD-10-CM | POA: Diagnosis not present

## 2019-08-17 DIAGNOSIS — R6889 Other general symptoms and signs: Secondary | ICD-10-CM | POA: Diagnosis not present

## 2019-09-04 DIAGNOSIS — K219 Gastro-esophageal reflux disease without esophagitis: Secondary | ICD-10-CM | POA: Diagnosis not present

## 2019-09-04 DIAGNOSIS — R195 Other fecal abnormalities: Secondary | ICD-10-CM | POA: Diagnosis not present

## 2019-09-04 DIAGNOSIS — D649 Anemia, unspecified: Secondary | ICD-10-CM | POA: Diagnosis not present

## 2019-09-04 DIAGNOSIS — K224 Dyskinesia of esophagus: Secondary | ICD-10-CM | POA: Diagnosis not present

## 2019-09-21 DIAGNOSIS — Z139 Encounter for screening, unspecified: Secondary | ICD-10-CM | POA: Diagnosis not present

## 2019-09-21 DIAGNOSIS — Z9181 History of falling: Secondary | ICD-10-CM | POA: Diagnosis not present

## 2019-09-21 DIAGNOSIS — E785 Hyperlipidemia, unspecified: Secondary | ICD-10-CM | POA: Diagnosis not present

## 2019-09-21 DIAGNOSIS — Z0000 Encounter for general adult medical examination without abnormal findings: Secondary | ICD-10-CM | POA: Diagnosis not present

## 2019-10-02 ENCOUNTER — Other Ambulatory Visit: Payer: Self-pay | Admitting: Cardiology

## 2019-10-24 ENCOUNTER — Other Ambulatory Visit: Payer: Self-pay | Admitting: Cardiology

## 2019-10-24 MED ORDER — HYDRALAZINE HCL 25 MG PO TABS: 12.5000 mg | ORAL_TABLET | Freq: Three times a day (TID) | ORAL | 3 refills | Status: DC

## 2019-10-24 NOTE — Telephone Encounter (Signed)
° ° ° °*  STAT* If patient is at the pharmacy, call can be transferred to refill team.   1. Which medications need to be refilled? (please list name of each medication and dose if known) hydrALAZINE (APRESOLINE) 25 MG tablet  2. Which pharmacy/location (including street and city if local pharmacy) is medication to be sent to? [place], [place]  3. Do they need a 30 day or 90 day supply? 90 days

## 2019-10-24 NOTE — Telephone Encounter (Signed)
Medication filled.  

## 2019-11-06 DIAGNOSIS — R413 Other amnesia: Secondary | ICD-10-CM | POA: Diagnosis not present

## 2019-11-06 DIAGNOSIS — F0150 Vascular dementia without behavioral disturbance: Secondary | ICD-10-CM | POA: Insufficient documentation

## 2019-11-06 DIAGNOSIS — G252 Other specified forms of tremor: Secondary | ICD-10-CM | POA: Diagnosis not present

## 2019-11-06 DIAGNOSIS — M47816 Spondylosis without myelopathy or radiculopathy, lumbar region: Secondary | ICD-10-CM | POA: Diagnosis not present

## 2019-11-06 DIAGNOSIS — M545 Low back pain, unspecified: Secondary | ICD-10-CM | POA: Diagnosis not present

## 2019-11-06 DIAGNOSIS — M4722 Other spondylosis with radiculopathy, cervical region: Secondary | ICD-10-CM | POA: Diagnosis not present

## 2019-11-06 HISTORY — DX: Vascular dementia, unspecified severity, without behavioral disturbance, psychotic disturbance, mood disturbance, and anxiety: F01.50

## 2019-11-07 ENCOUNTER — Telehealth: Payer: Self-pay | Admitting: Cardiology

## 2019-11-07 NOTE — Telephone Encounter (Signed)
Patient dropped DMV forms off at the office on 11/07/2019 to be completed by Dr. Bettina Gavia. Patient authorization complete/signed. Placed in Dr. Joya Gaskins box./jsa 11/07/19

## 2019-11-09 NOTE — Telephone Encounter (Signed)
Left message for patient to call office.  

## 2019-11-09 NOTE — Telephone Encounter (Signed)
Per Dr. Bettina Gavia the patient needs to take these papers to her PCP to have them filled out.

## 2019-11-17 DIAGNOSIS — G309 Alzheimer's disease, unspecified: Secondary | ICD-10-CM | POA: Diagnosis not present

## 2019-11-17 DIAGNOSIS — R7303 Prediabetes: Secondary | ICD-10-CM | POA: Diagnosis not present

## 2019-11-17 DIAGNOSIS — E7800 Pure hypercholesterolemia, unspecified: Secondary | ICD-10-CM | POA: Diagnosis not present

## 2019-11-17 DIAGNOSIS — D649 Anemia, unspecified: Secondary | ICD-10-CM | POA: Diagnosis not present

## 2019-11-17 DIAGNOSIS — I129 Hypertensive chronic kidney disease with stage 1 through stage 4 chronic kidney disease, or unspecified chronic kidney disease: Secondary | ICD-10-CM | POA: Diagnosis not present

## 2019-11-21 DIAGNOSIS — M4722 Other spondylosis with radiculopathy, cervical region: Secondary | ICD-10-CM | POA: Diagnosis not present

## 2019-11-29 DIAGNOSIS — D649 Anemia, unspecified: Secondary | ICD-10-CM | POA: Diagnosis not present

## 2019-12-07 DIAGNOSIS — R195 Other fecal abnormalities: Secondary | ICD-10-CM | POA: Diagnosis not present

## 2019-12-07 DIAGNOSIS — D638 Anemia in other chronic diseases classified elsewhere: Secondary | ICD-10-CM | POA: Diagnosis not present

## 2019-12-18 ENCOUNTER — Telehealth (INDEPENDENT_AMBULATORY_CARE_PROVIDER_SITE_OTHER): Payer: Self-pay | Admitting: Cardiology

## 2019-12-18 DIAGNOSIS — Z0279 Encounter for issue of other medical certificate: Secondary | ICD-10-CM

## 2019-12-18 NOTE — Telephone Encounter (Signed)
DMV paperwork/Forms from Sharon Wise received on 12/18/2019. Completed patient auth attached. Took form to MD box for completion.  kbl 12/18/19

## 2019-12-26 DIAGNOSIS — Z1231 Encounter for screening mammogram for malignant neoplasm of breast: Secondary | ICD-10-CM | POA: Diagnosis not present

## 2019-12-29 NOTE — Telephone Encounter (Signed)
Patient request a letter for [facility]  She has very mild CAD minimal atherosclerosis on cardiac CTA  She has shortness of breath and echocardiogram in April 2020 showed normal left ventricular function and no findings of heart failure.  Most recently she had PFTs performed 06/20/2019 that showed minimal obstructive and mild restrictive lung disease.  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

## 2019-12-29 NOTE — Telephone Encounter (Signed)
Pt picked up forms today 12/29/19 and payment was posted and collected.   Kbl 12/29/19

## 2020-01-16 ENCOUNTER — Other Ambulatory Visit: Payer: Self-pay | Admitting: Cardiology

## 2020-01-16 NOTE — Telephone Encounter (Signed)
Refill sent to pharmacy.   

## 2020-01-25 DIAGNOSIS — R519 Headache, unspecified: Secondary | ICD-10-CM | POA: Diagnosis not present

## 2020-01-25 DIAGNOSIS — J329 Chronic sinusitis, unspecified: Secondary | ICD-10-CM | POA: Diagnosis not present

## 2020-01-25 DIAGNOSIS — R432 Parageusia: Secondary | ICD-10-CM | POA: Diagnosis not present

## 2020-01-25 DIAGNOSIS — R5383 Other fatigue: Secondary | ICD-10-CM | POA: Diagnosis not present

## 2020-01-25 DIAGNOSIS — R430 Anosmia: Secondary | ICD-10-CM | POA: Diagnosis not present

## 2020-02-02 DIAGNOSIS — J80 Acute respiratory distress syndrome: Secondary | ICD-10-CM | POA: Diagnosis not present

## 2020-02-02 DIAGNOSIS — J811 Chronic pulmonary edema: Secondary | ICD-10-CM | POA: Diagnosis not present

## 2020-02-02 DIAGNOSIS — I4891 Unspecified atrial fibrillation: Secondary | ICD-10-CM | POA: Diagnosis not present

## 2020-02-02 DIAGNOSIS — I119 Hypertensive heart disease without heart failure: Secondary | ICD-10-CM | POA: Diagnosis not present

## 2020-02-02 DIAGNOSIS — R519 Headache, unspecified: Secondary | ICD-10-CM | POA: Diagnosis not present

## 2020-02-02 DIAGNOSIS — Z743 Need for continuous supervision: Secondary | ICD-10-CM | POA: Diagnosis not present

## 2020-02-02 DIAGNOSIS — R0789 Other chest pain: Secondary | ICD-10-CM | POA: Diagnosis not present

## 2020-02-02 DIAGNOSIS — R079 Chest pain, unspecified: Secondary | ICD-10-CM | POA: Diagnosis not present

## 2020-02-02 DIAGNOSIS — R059 Cough, unspecified: Secondary | ICD-10-CM | POA: Diagnosis not present

## 2020-02-02 DIAGNOSIS — I491 Atrial premature depolarization: Secondary | ICD-10-CM | POA: Diagnosis not present

## 2020-02-02 DIAGNOSIS — R001 Bradycardia, unspecified: Secondary | ICD-10-CM | POA: Diagnosis not present

## 2020-02-02 DIAGNOSIS — I6789 Other cerebrovascular disease: Secondary | ICD-10-CM | POA: Diagnosis not present

## 2020-02-02 DIAGNOSIS — R0602 Shortness of breath: Secondary | ICD-10-CM | POA: Diagnosis not present

## 2020-02-02 DIAGNOSIS — Z20822 Contact with and (suspected) exposure to covid-19: Secondary | ICD-10-CM | POA: Diagnosis not present

## 2020-02-02 DIAGNOSIS — D649 Anemia, unspecified: Secondary | ICD-10-CM | POA: Diagnosis not present

## 2020-02-02 DIAGNOSIS — I517 Cardiomegaly: Secondary | ICD-10-CM | POA: Diagnosis not present

## 2020-02-02 DIAGNOSIS — I10 Essential (primary) hypertension: Secondary | ICD-10-CM | POA: Diagnosis not present

## 2020-02-03 DIAGNOSIS — R519 Headache, unspecified: Secondary | ICD-10-CM | POA: Diagnosis not present

## 2020-02-08 DIAGNOSIS — R921 Mammographic calcification found on diagnostic imaging of breast: Secondary | ICD-10-CM | POA: Diagnosis not present

## 2020-02-08 DIAGNOSIS — R922 Inconclusive mammogram: Secondary | ICD-10-CM | POA: Diagnosis not present

## 2020-02-08 DIAGNOSIS — R928 Other abnormal and inconclusive findings on diagnostic imaging of breast: Secondary | ICD-10-CM | POA: Diagnosis not present

## 2020-03-04 DIAGNOSIS — N182 Chronic kidney disease, stage 2 (mild): Secondary | ICD-10-CM | POA: Diagnosis not present

## 2020-03-06 DIAGNOSIS — I129 Hypertensive chronic kidney disease with stage 1 through stage 4 chronic kidney disease, or unspecified chronic kidney disease: Secondary | ICD-10-CM | POA: Diagnosis not present

## 2020-03-06 DIAGNOSIS — M9080 Osteopathy in diseases classified elsewhere, unspecified site: Secondary | ICD-10-CM | POA: Diagnosis not present

## 2020-03-06 DIAGNOSIS — N1832 Chronic kidney disease, stage 3b: Secondary | ICD-10-CM | POA: Diagnosis not present

## 2020-03-06 DIAGNOSIS — D631 Anemia in chronic kidney disease: Secondary | ICD-10-CM | POA: Diagnosis not present

## 2020-03-06 DIAGNOSIS — E889 Metabolic disorder, unspecified: Secondary | ICD-10-CM | POA: Diagnosis not present

## 2020-03-06 DIAGNOSIS — N183 Chronic kidney disease, stage 3 unspecified: Secondary | ICD-10-CM | POA: Diagnosis not present

## 2020-03-06 DIAGNOSIS — E559 Vitamin D deficiency, unspecified: Secondary | ICD-10-CM | POA: Diagnosis not present

## 2020-04-17 ENCOUNTER — Other Ambulatory Visit: Payer: Self-pay | Admitting: Cardiology

## 2020-04-17 NOTE — Telephone Encounter (Signed)
Refill sent to pharmacy.   

## 2020-04-18 ENCOUNTER — Other Ambulatory Visit: Payer: Self-pay

## 2020-04-18 ENCOUNTER — Encounter: Payer: Self-pay | Admitting: Podiatry

## 2020-04-18 ENCOUNTER — Other Ambulatory Visit: Payer: Self-pay | Admitting: *Deleted

## 2020-04-18 ENCOUNTER — Ambulatory Visit (INDEPENDENT_AMBULATORY_CARE_PROVIDER_SITE_OTHER): Payer: Medicare Other

## 2020-04-18 ENCOUNTER — Ambulatory Visit (INDEPENDENT_AMBULATORY_CARE_PROVIDER_SITE_OTHER): Payer: Medicare Other | Admitting: Podiatry

## 2020-04-18 DIAGNOSIS — M2141 Flat foot [pes planus] (acquired), right foot: Secondary | ICD-10-CM | POA: Diagnosis not present

## 2020-04-18 DIAGNOSIS — M25572 Pain in left ankle and joints of left foot: Secondary | ICD-10-CM | POA: Diagnosis not present

## 2020-04-18 DIAGNOSIS — M2060 Acquired deformities of toe(s), unspecified, unspecified foot: Secondary | ICD-10-CM

## 2020-04-18 DIAGNOSIS — M25571 Pain in right ankle and joints of right foot: Secondary | ICD-10-CM

## 2020-04-18 DIAGNOSIS — M2142 Flat foot [pes planus] (acquired), left foot: Secondary | ICD-10-CM

## 2020-04-18 MED ORDER — BETAMETHASONE SOD PHOS & ACET 6 (3-3) MG/ML IJ SUSP
12.0000 mg | Freq: Once | INTRAMUSCULAR | Status: AC
  Administered 2020-05-16: 12 mg

## 2020-04-18 NOTE — Progress Notes (Signed)
  Subjective:  Patient ID: Sharon Wise, female    DOB: 11-25-1950,  MRN: [id number]  Chief Complaint  Patient presents with  . Foot Problem    I am hurting on both feet and the left foot is the worst    70 y.o. female presents with the above complaint. History confirmed with patient. States she has pain in her feet preventing her from walking and doing activities. Pain in both ankle area and in the bunion area. Left is worse. Can't find shoes to comfortably wear.  Underwent surgery on left foot 15 years ago. Objective:  Physical Exam: warm, good capillary refill, no trophic changes or ulcerative lesions, normal DP and PT pulses and normal sensory exam. Pes planus bilat, POP both sinus tarsi POP 1st MPJ bilat with large bunion deformity  No images are attached to the encounter.  Radiographs: X-ray of both feet: pes planus, midfoot and hindfoot degenerative changes. Moderate HAV deformity bilat, L>R Assessment:   1. Sinus tarsi syndrome of both ankles   2. Pes planus of both feet   3. Arthralgia of left hindfoot   4. Arthralgia of right hindfoot     Plan:  Patient was evaluated and treated and all questions answered.  Sinus Tarsitis -X-rays reviewed as above -Injection delivered to the sinus tarsi  Procedure: Injection Intermediate Joint Consent: Verbal consent obtained. Location: Bilateral sinus tarsi. Skin Prep: alcohol. Injectate: 1 cc 0.5% marcaine plain, 1 cc betamethasone acetate-betamethasone sodium phosphate Disposition: Patient tolerated procedure well. Injection site dressed with a band-aid.  Hallux Valgus bilat -Discussed etiology -Bunion pads dispensed. -May benefit from correction  Return in about 1 month (around 05/18/2020) for Flatfoot with arthritis f/u.

## 2020-05-16 ENCOUNTER — Other Ambulatory Visit: Payer: Self-pay

## 2020-05-16 ENCOUNTER — Ambulatory Visit (INDEPENDENT_AMBULATORY_CARE_PROVIDER_SITE_OTHER): Payer: Medicare Other | Admitting: Podiatry

## 2020-05-16 ENCOUNTER — Encounter: Payer: Self-pay | Admitting: Podiatry

## 2020-05-16 DIAGNOSIS — M25571 Pain in right ankle and joints of right foot: Secondary | ICD-10-CM

## 2020-05-16 DIAGNOSIS — M25572 Pain in left ankle and joints of left foot: Secondary | ICD-10-CM

## 2020-05-16 MED ORDER — BETAMETHASONE SOD PHOS & ACET 6 (3-3) MG/ML IJ SUSP
12.0000 mg | Freq: Once | INTRAMUSCULAR | Status: AC
  Administered 2020-05-16: 12 mg

## 2020-05-16 NOTE — Progress Notes (Signed)
  Subjective:  Patient ID: Sharon Wise, female    DOB: November 29, 1950,  MRN: [id number]  Chief Complaint  Patient presents with  . Foot Problem    I am about the same but maybe a little better and the shot did help and the padding did help alot    70 y.o. female presents with the above complaint. History confirmed with patient. States the shots helped a lot. Objective:  Physical Exam: warm, good capillary refill, no trophic changes or ulcerative lesions, normal DP and PT pulses and normal sensory exam. Pes planus bilat, POP both sinus tarsi POP 1st MPJ bilat with large bunion deformity Assessment:   1. Sinus tarsi syndrome of both ankles     Plan:  Patient was evaluated and treated and all questions answered.  Sinus Tarsitis -Repeat injection as below.  Procedure: Injection Intermediate Joint Consent: Verbal consent obtained. Location: Bilateral sinus tarsi. Skin Prep: alcohol. Injectate: 1 cc 0.5% marcaine plain, 1 cc betamethasone acetate-betamethasone sodium phosphate Disposition: Patient tolerated procedure well. Injection site dressed with a band-aid.  Hallux Valgus bilat -May benefit from correction  Return in about 3 weeks (around 06/06/2020) for sinus tarsitis.

## 2020-06-03 ENCOUNTER — Ambulatory Visit (INDEPENDENT_AMBULATORY_CARE_PROVIDER_SITE_OTHER): Payer: Medicare Other | Admitting: Cardiology

## 2020-06-03 ENCOUNTER — Encounter: Payer: Self-pay | Admitting: Cardiology

## 2020-06-03 ENCOUNTER — Telehealth: Payer: Self-pay | Admitting: Cardiology

## 2020-06-03 ENCOUNTER — Other Ambulatory Visit: Payer: Self-pay

## 2020-06-03 VITALS — BP 124/72 | HR 71 | Ht 62.0 in | Wt 166.8 lb

## 2020-06-03 DIAGNOSIS — E782 Mixed hyperlipidemia: Secondary | ICD-10-CM

## 2020-06-03 DIAGNOSIS — I491 Atrial premature depolarization: Secondary | ICD-10-CM

## 2020-06-03 DIAGNOSIS — N289 Disorder of kidney and ureter, unspecified: Secondary | ICD-10-CM

## 2020-06-03 HISTORY — DX: Disorder of kidney and ureter, unspecified: N28.9

## 2020-06-03 HISTORY — DX: Atrial premature depolarization: I49.1

## 2020-06-03 NOTE — Telephone Encounter (Signed)
Pt came into the office. The pt is now scheduled for 05/25 at 8. If need to be seen sooner please call/kbl

## 2020-06-03 NOTE — Progress Notes (Signed)
Cardiology Office Note:    Date:  06/03/2020   ID:  Sharon Wise, DOB September 26, 1950, MRN [id number]  PCP:  Sharon Dress, MD  Cardiologist:  Sharon Lindau, MD   Referring MD: Sharon Dress, MD    ASSESSMENT:    1. Mixed hyperlipidemia   2. Premature contractions, atrial   3. Renal insufficiency    PLAN:    In order of problems listed above:  1. Primary prevention stressed with the patient.  Importance of compliance with diet medication stressed and she vocalized understanding. 2. Frequent PACs: A suspicion for atrial fibrillation was raised.  I reassured her about my findings today I reviewed multiple EKGs including the one today and found them to be benign. 3. Essential hypertension: Blood pressure stable and diet was emphasized.  Lifestyle modification urged 4. Mixed dyslipidemia and renal insufficiency: Managed by primary care provider.  She will be seen in follow-up appointment on a as needed basis.  Patient had multiple questions which were answered to her satisfaction.   Medication Adjustments/Labs and Tests Ordered: Current medicines are reviewed at length with the patient today.  Concerns regarding medicines are outlined above.  Orders Placed This Encounter  Procedures  . EKG 12-Lead   No orders of the defined types were placed in this encounter.    No chief complaint on file.    History of Present Illness:    Sharon Wise is a 70 y.o. female.  Patient has past medical history of essential hypertension, dyslipidemia and history of PVCs.  She had mildly elevated calcium score managed by primary.  She has seen Dr. Lianne Cure in the past.  She takes care of activities of daily living.  She has renal insufficiency.  She saw primary care yesterday and she was found to have irregular heartbeat and suspicion for atrial fibrillation and therefore she was sent here.  At the time of my evaluation, the patient is alert awake oriented and in no distress.  Past  Medical History:  Diagnosis Date  . Adhesive capsulitis of right shoulder 03/31/2016  . AKI (acute kidney injury) ([place]) 03/28/2015  . 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 Angina pectoris ([place]) 03/24/2018   She was seen at [facility] cardiology in [place] in 2019 for chest pain she had a myocardial perfusion study done pharmacologically I cannot obtain the report but there is a notation in the office visit 03/24/2018 that was normal.  . Anxiety disorder 12/09/2015  . Arthritis   . Carpal tunnel syndrome of right wrist 06/25/2017  . Chronic bilateral low back pain without sciatica 04/03/2015  . Chronic kidney disease   . 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 Hyperlipidemia 05/13/2018  . Hypertension   . Hypertensive chronic kidney disease 05/12/2016  . Impingement syndrome of right shoulder 03/31/2016  . Late onset Alzheimer's disease without behavioral disturbance ([place]) 04/03/2015  . Lumbar spondylosis 11/09/2018  . Metabolic bone disease [id number]  . Pain due to internal orthopedic prosthetic devices, implants and grafts, subsequent encounter 07/09/2017  . Pain due to total left knee replacement ([place]) 12/04/2015  . Presence of left artificial knee joint 07/09/2017  . Primary osteoarthritis of both knees 12/03/2015  . Primary osteoarthritis of right knee 12/04/2015  . Tremor   . Vitamin D deficiency 03/28/2015    Past Surgical History:  Procedure Laterality Date  . CARPAL TUNNEL RELEASE    . FOOT SURGERY    . JOINT REPLACEMENT    .  REPLACEMENT UNICONDYLAR JOINT KNEE    . VAGINAL HYSTERECTOMY      Current Medications: Current Meds  Medication Sig  . ADVAIR HFA 230-21 MCG/ACT inhaler Inhale 1 puff into the lungs every 12 (twelve) hours.  Marland Kitchen allopurinol (ZYLOPRIM) 100 MG tablet Take 100 mg by mouth daily.  . Baclofen 5 MG TABS Take 1-2 tablets by mouth at bedtime.  . cetirizine (ZYRTEC) 10 MG tablet Take 10 mg by mouth daily.  . Cholecalciferol 50 MCG  (2000 UT) TABS Take 1 tablet by mouth daily.  . citalopram (CELEXA) 20 MG tablet Take 20 mg by mouth daily.  . clonazePAM (KLONOPIN) 0.5 MG tablet Take 0.5 mg by mouth 2 (two) times daily.   Marland Kitchen donepezil (ARICEPT ODT) 10 MG disintegrating tablet Take 10 mg by mouth daily.  Marland Kitchen doxycycline (VIBRAMYCIN) 100 MG capsule Take 100 mg by mouth 2 (two) times daily.  . famotidine (PEPCID) 40 MG tablet Take 40 mg by mouth daily.  . fluticasone (FLONASE) 50 MCG/ACT nasal spray Place 1 spray into both nostrils 2 (two) times daily.  . hydrALAZINE (APRESOLINE) 25 MG tablet TAKE 1/2 TABLET BY MOUTH 3 TIMES DAILY  . isosorbide mononitrate (IMDUR) 30 MG 24 hr tablet Take 1 tablet (30 mg total) by mouth daily.  . memantine (NAMENDA) 5 MG tablet Take 5 mg by mouth 2 (two) times daily.  . metoprolol tartrate (LOPRESSOR) 25 MG tablet TAKE ONE TABLET BY MOUTH TWICE DAILY  . nitroGLYCERIN (NITROSTAT) 0.4 MG SL tablet Place 0.4 mg under the tongue every 5 (five) minutes as needed for chest pain.  Marland Kitchen omeprazole (PRILOSEC) 20 MG capsule Take 20 mg by mouth daily.  Marland Kitchen oxybutynin (DITROPAN-XL) 5 MG 24 hr tablet Take 5 mg by mouth daily.  . pantoprazole (PROTONIX) 40 MG tablet Take 40 mg by mouth daily.  . potassium chloride SA (K-DUR) 20 MEQ tablet Take 20 mEq by mouth every evening.  . pravastatin (PRAVACHOL) 40 MG tablet Take 20 mg by mouth daily.   Marland Kitchen rOPINIRole (REQUIP) 0.25 MG tablet Take 1 tablet by mouth every 12 (twelve) hours.  . traZODone (DESYREL) 50 MG tablet Take 50 mg by mouth at bedtime.  . triamcinolone ointment (KENALOG) 0.1 % Apply 1 application topically as needed (rash).  . Wheat Dextrin (BENEFIBER) POWD Take 1 (one) tablespoon(s) daily     Allergies:   Meloxicam   Social History   Socioeconomic History  . Marital status: Divorced    Spouse name: Not on file  . Number of children: Not on file  . Years of education: Not on file  . Highest education level: Not on file  Occupational History  . Not  on file  Tobacco Use  . Smoking status: Never Smoker  . Smokeless tobacco: Never Used  Vaping Use  . Vaping Use: Never used  Substance and Sexual Activity  . Alcohol use: Never  . Drug use: Never  . Sexual activity: Not Currently  Other Topics Concern  . Not on file  Social History Narrative  . Not on file   Social Determinants of Health   Financial Resource Strain: Not on file  Food Insecurity: Not on file  Transportation Needs: Not on file  Physical Activity: Not on file  Stress: Not on file  Social Connections: Not on file     Family History: The patient's family history includes Cancer in her sister and sister; Heart disease in her father and mother; Hypertension in her mother.  ROS:   Please  see the history of present illness.    All other systems reviewed and are negative.  EKGs/Labs/Other Studies Reviewed:    The following studies were reviewed today: I reviewed records from primary care doctor's office including EKG and this reveals sinus rhythm with PACs.   Recent Labs: No results found for requested labs within last 8760 hours.  Recent Lipid Panel    Component Value Date/Time   CHOL  05/01/2008 0810    155        ATP III CLASSIFICATION:  <200     mg/dL   Desirable  200-239  mg/dL   Borderline High  >=240    mg/dL   High          TRIG 115 05/01/2008 1826   HDL 55 05/01/2008 0810   CHOLHDL 2.8 05/01/2008 0810   VLDL 22 05/01/2008 0810   LDLCALC  05/01/2008 0810    78        Total Cholesterol/HDL:CHD Risk Coronary Heart Disease Risk Table                     Men   Women  1/2 Average Risk   3.4   3.3  Average Risk       5.0   4.4  2 X Average Risk   9.6   7.1  3 X Average Risk  23.4   11.0        Use the calculated Patient Ratio above and the CHD Risk Table to determine the patient's CHD Risk.        ATP III CLASSIFICATION (LDL):  <100     mg/dL   Optimal  100-129  mg/dL   Near or Above                    Optimal  130-159  mg/dL    Borderline  160-189  mg/dL   High  >190     mg/dL   Very High    Physical Exam:    VS:  BP 124/72   Pulse 71   Ht '5\' 2"'$  (1.575 m)   Wt 166 lb 12.8 oz (75.7 kg)   LMP  (LMP Unknown)   SpO2 97%   BMI 30.51 kg/m     Wt Readings from Last 3 Encounters:  06/03/20 166 lb 12.8 oz (75.7 kg)  06/14/19 169 lb 3.2 oz (76.7 kg)  03/17/19 168 lb (76.2 kg)     GEN: Patient is in no acute distress HEENT: Normal NECK: No JVD; No carotid bruits LYMPHATICS: No lymphadenopathy CARDIAC: Hear sounds regular, 2/6 systolic murmur at the apex. RESPIRATORY:  Clear to auscultation without rales, wheezing or rhonchi  ABDOMEN: Soft, non-tender, non-distended MUSCULOSKELETAL:  No edema; No deformity  SKIN: Warm and dry NEUROLOGIC:  Alert and oriented x 3 PSYCHIATRIC:  Normal affect   Signed, Sharon Lindau, MD  06/03/2020 4:36 PM    [facility]

## 2020-06-03 NOTE — Telephone Encounter (Signed)
Spoke with Sharon Wise just now and she let me know that this patient has new onset atrial fibrillation per Dr. Delena Bali. She states that the patient needs to be seen ASAP for this.   I tried calling the patient to schedule her for an apointment but could not reach her and left a voicemail for her to call us back.

## 2020-06-03 NOTE — Telephone Encounter (Signed)
Per Becky with Dr. Delena Bali they would like for the patient to be seen today. I scheduled her at 3:40 with Dr. Geraldo Pitter.

## 2020-06-03 NOTE — Telephone Encounter (Signed)
Inez Catalina was calling to get an appt for the patient. Inez Catalina state that the patient has new on set on atrial fib and needs appt as soon as possible. Please give Inez Catalina a call back at [phone]

## 2020-06-03 NOTE — Patient Instructions (Signed)

## 2020-06-06 ENCOUNTER — Encounter: Payer: Self-pay | Admitting: Podiatry

## 2020-06-06 ENCOUNTER — Ambulatory Visit (INDEPENDENT_AMBULATORY_CARE_PROVIDER_SITE_OTHER): Payer: Medicare Other | Admitting: Podiatry

## 2020-06-06 ENCOUNTER — Other Ambulatory Visit: Payer: Self-pay

## 2020-06-06 DIAGNOSIS — M2011 Hallux valgus (acquired), right foot: Secondary | ICD-10-CM

## 2020-06-06 DIAGNOSIS — M21611 Bunion of right foot: Secondary | ICD-10-CM | POA: Diagnosis not present

## 2020-06-06 DIAGNOSIS — M25572 Pain in left ankle and joints of left foot: Secondary | ICD-10-CM | POA: Diagnosis not present

## 2020-06-06 DIAGNOSIS — M25571 Pain in right ankle and joints of right foot: Secondary | ICD-10-CM

## 2020-06-06 NOTE — Progress Notes (Signed)
  Subjective:  Patient ID: Sharon Wise, female    DOB: [birth year],  MRN: [id number]  Chief Complaint  Patient presents with  . Foot Problem    I am doing better and the shots did help but there is some stiffness and touchy at night on both feet    70 y.o. female presents with the above complaint. History confirmed with patient.  Objective:  Physical Exam: warm, good capillary refill, no trophic changes or ulcerative lesions, normal DP and PT pulses and normal sensory exam. Pes planus bilat, POP both sinus tarsi POP 1st MPJ bilat with large bunion deformity Assessment:   1. Sinus tarsi syndrome of both ankles   2. 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 Arthralgia of left hindfoot    Plan:  Patient was evaluated and treated and all questions answered.  Sinus Tarsitis, Hallux Valgus bilat -Patient has failed all conservative therapy and wishes to proceed with surgical intervention. All risks, benefits, and alternatives discussed with patient. No guarantees given. Consent reviewed and signed by patient. -Planned procedures: correction right foot bunion with cutting and shifting of bone, right subtalar joint arthroscopy -ASA 3 - Patient with moderate systemic disease with functional limitations; Risk factors: CKD -Post-op anticoagulation: chemoprophylaxis not indicated -DME dispensed for post-op use: CAM Boot  No follow-ups on file.

## 2020-06-07 ENCOUNTER — Encounter: Payer: Self-pay | Admitting: Podiatry

## 2020-06-12 ENCOUNTER — Telehealth: Payer: Self-pay | Admitting: Urology

## 2020-06-12 ENCOUNTER — Ambulatory Visit: Payer: Medicare Other | Admitting: Cardiology

## 2020-06-12 NOTE — Telephone Encounter (Signed)
DOS - 07/03/20  [place] BUNIONECTOMY RIGHT --- ID:[id number] SUBTARLOR  ARTHROSCOPY RIGHT --- 29906   [facility] EFFECTIVE DATE - 04/19/20   PLAN DEDUCTIBLE - $0.00  W/ $0.00  REMAINING OUT OF POCKET - $7,550.00  W/ $7,524.07 REMAINING COINSURANCE - 20% COPAY - $0.00   PER [employer] WEB SIT FOR CPT CODES 42595 AND 63875  Notification or Prior Authorization is not required for the requested services  Decision ID [id number]

## 2020-07-08 ENCOUNTER — Encounter: Payer: Medicare Other | Admitting: Podiatry

## 2020-07-10 ENCOUNTER — Other Ambulatory Visit: Payer: Self-pay | Admitting: Podiatry

## 2020-07-10 ENCOUNTER — Encounter: Payer: Self-pay | Admitting: Podiatry

## 2020-07-10 DIAGNOSIS — M2011 Hallux valgus (acquired), right foot: Secondary | ICD-10-CM

## 2020-07-10 MED ORDER — ONDANSETRON HCL 4 MG PO TABS: 4.0000 mg | ORAL_TABLET | Freq: Three times a day (TID) | ORAL | 0 refills | Status: DC | PRN

## 2020-07-10 MED ORDER — CEPHALEXIN 500 MG PO CAPS
500.0000 mg | ORAL_CAPSULE | Freq: Three times a day (TID) | ORAL | 0 refills | Status: DC
Start: 2020-07-10 — End: 2020-11-27

## 2020-07-10 MED ORDER — OXYCODONE-ACETAMINOPHEN 5-325 MG PO TABS: 1.0000 | ORAL_TABLET | ORAL | 0 refills | Status: DC | PRN

## 2020-07-10 NOTE — Progress Notes (Unsigned)
per

## 2020-07-15 ENCOUNTER — Ambulatory Visit (INDEPENDENT_AMBULATORY_CARE_PROVIDER_SITE_OTHER): Payer: Medicare Other

## 2020-07-15 ENCOUNTER — Other Ambulatory Visit: Payer: Self-pay

## 2020-07-15 ENCOUNTER — Encounter: Payer: Self-pay | Admitting: Podiatry

## 2020-07-15 ENCOUNTER — Ambulatory Visit (INDEPENDENT_AMBULATORY_CARE_PROVIDER_SITE_OTHER): Payer: Medicare Other | Admitting: Podiatry

## 2020-07-15 DIAGNOSIS — M21611 Bunion of right foot: Secondary | ICD-10-CM | POA: Diagnosis not present

## 2020-07-15 DIAGNOSIS — M2011 Hallux valgus (acquired), right foot: Secondary | ICD-10-CM

## 2020-07-15 DIAGNOSIS — M25572 Pain in left ankle and joints of left foot: Secondary | ICD-10-CM

## 2020-07-15 DIAGNOSIS — M25571 Pain in right ankle and joints of right foot: Secondary | ICD-10-CM

## 2020-07-15 MED ORDER — OXYCODONE-ACETAMINOPHEN 5-325 MG PO TABS: 1.0000 | ORAL_TABLET | ORAL | 0 refills | Status: DC | PRN

## 2020-07-15 NOTE — Progress Notes (Signed)
  Subjective:  Patient ID: Sharon Wise, female    DOB: 1950-04-27,  MRN: [id number]  Chief Complaint  Patient presents with   Routine Post Op    I am doing ok and it has been rough on the right foot    DOS: 07/10/20 Procedure: [place] bunionectomy right, STJ arthroscopy  70 y.o. female presents with the above complaint. History confirmed with patient.   Objective:  Physical Exam: tenderness at the surgical site, local edema noted, and calf supple, nontender. Incision: healing well, no significant drainage, no dehiscence  No images are attached to the encounter.  Radiographs: X-ray of the right foot: consistent with post-op state improved HAV deformity fixation intact.   Assessment:   1. 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 Sinus tarsi syndrome of both ankles     Plan:  Patient was evaluated and treated and all questions answered.  Post-operative State -XR reviewed with patient -Dressing applied consisting of betadine, sterile gauze, kerlix, and ACE bandage -WBAT in CAM boot -Pain medication refilled -XRs needed at follow-up: none   Return in about 1 week (around 07/22/2020) for Post-Op (No XRs).

## 2020-07-25 ENCOUNTER — Encounter: Payer: Medicare Other | Admitting: Podiatry

## 2020-07-29 ENCOUNTER — Ambulatory Visit (INDEPENDENT_AMBULATORY_CARE_PROVIDER_SITE_OTHER): Payer: Medicare Other | Admitting: Podiatry

## 2020-07-29 ENCOUNTER — Other Ambulatory Visit: Payer: Self-pay

## 2020-07-29 DIAGNOSIS — Z9889 Other specified postprocedural states: Secondary | ICD-10-CM

## 2020-07-29 DIAGNOSIS — M25571 Pain in right ankle and joints of right foot: Secondary | ICD-10-CM

## 2020-07-29 DIAGNOSIS — M2011 Hallux valgus (acquired), right foot: Secondary | ICD-10-CM

## 2020-07-29 DIAGNOSIS — M21611 Bunion of right foot: Secondary | ICD-10-CM

## 2020-07-29 DIAGNOSIS — M25572 Pain in left ankle and joints of left foot: Secondary | ICD-10-CM

## 2020-07-29 NOTE — Progress Notes (Signed)
  Subjective:  Patient ID: Sharon Wise, female    DOB: 24-Jun-1950,  MRN: [id number]  Chief Complaint  Patient presents with   Routine Post Op    POV #2 DOS 07/10/2020 RT FOOT CORRECTION OF BUNION W/CUTTING & SHIFTING OF BONE, RT SUBTALAR JOINT ARTHROSCOPY W/DEBRIDEMENT -pt deneis N/V/F/CH -pt states," pain is about the same, it's been throbbing; 5/10.constant pain" - dressing intact Tx: boot, cane, oxy,icing and elevation    DOS: 07/10/20 Procedure: [place] bunionectomy right, STJ arthroscopy  70 y.o. female presents with the above complaint. History confirmed with patient.   Objective:  Physical Exam: tenderness at the surgical site, local edema noted, and calf supple, nontender. Incision: healing well, no significant drainage, no dehiscence  Assessment:   1. 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 Sinus tarsi syndrome of both ankles   3. Post-operative state    Plan:  Patient was evaluated and treated and all questions answered.  Post-operative State -Sutures removed -Steri-strips applied to the incision -Ok to start showering at this time. Advised they cannot soak. -WBAT in CAM boot -XRs needed at follow-up: 3 view Foot   Return in about 2 weeks (around 08/12/2020) for Post-Op (with XRs).

## 2020-08-12 ENCOUNTER — Ambulatory Visit (INDEPENDENT_AMBULATORY_CARE_PROVIDER_SITE_OTHER): Payer: Medicare Other | Admitting: Podiatry

## 2020-08-12 ENCOUNTER — Other Ambulatory Visit: Payer: Self-pay

## 2020-08-12 ENCOUNTER — Ambulatory Visit (INDEPENDENT_AMBULATORY_CARE_PROVIDER_SITE_OTHER): Payer: Medicare Other

## 2020-08-12 DIAGNOSIS — Z9889 Other specified postprocedural states: Secondary | ICD-10-CM | POA: Diagnosis not present

## 2020-08-12 DIAGNOSIS — M2011 Hallux valgus (acquired), right foot: Secondary | ICD-10-CM

## 2020-08-12 DIAGNOSIS — M21611 Bunion of right foot: Secondary | ICD-10-CM | POA: Diagnosis not present

## 2020-08-12 NOTE — Progress Notes (Signed)
  Subjective:  Patient ID: Sharon Wise, female    DOB: 11/06/1950,  MRN: [id number]  Chief Complaint  Patient presents with   Routine Post Op    POV #3 -pt deneis N/V/f?Ch -pt stats," it's doing much better, foot is feeling good. Pain is less; 6/10 when walking." - no swelling tx: boot, elevation and cane   DOS: 07/10/20 Procedure: [place] bunionectomy right, STJ arthroscopy  70 y.o. female presents with the above complaint. History confirmed with patient.   Objective:  Physical Exam: tenderness at the surgical site, local edema noted, and calf supple, nontender. Incision: healing well, no significant drainage, no dehiscence  X-Rays: taken and reviewed. Healing of osteotomy noted with stable hardware. Assessment:   1. 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 Post-operative state    Plan:  Patient was evaluated and treated and all questions answered.  Post-operative State -XR reviewed with patient -WBAT in Surgical shoe -Surgical shoe dispensed -Wait at least 1 week to drive. -XRs needed at follow-up: 3 view Foot   No follow-ups on file.

## 2020-08-22 ENCOUNTER — Other Ambulatory Visit: Payer: Self-pay

## 2020-08-22 MED ORDER — HYDRALAZINE HCL 25 MG PO TABS: ORAL_TABLET | ORAL | 2 refills | Status: DC

## 2020-08-22 NOTE — Telephone Encounter (Signed)
Refill of Hydralazine 25 mg sent to [facility].

## 2020-08-26 ENCOUNTER — Other Ambulatory Visit: Payer: Self-pay

## 2020-08-26 ENCOUNTER — Ambulatory Visit (INDEPENDENT_AMBULATORY_CARE_PROVIDER_SITE_OTHER): Payer: Medicare Other | Admitting: Podiatry

## 2020-08-26 ENCOUNTER — Ambulatory Visit (INDEPENDENT_AMBULATORY_CARE_PROVIDER_SITE_OTHER): Payer: Medicare Other

## 2020-08-26 DIAGNOSIS — Z9889 Other specified postprocedural states: Secondary | ICD-10-CM | POA: Diagnosis not present

## 2020-08-26 DIAGNOSIS — M2142 Flat foot [pes planus] (acquired), left foot: Secondary | ICD-10-CM

## 2020-08-26 DIAGNOSIS — M2011 Hallux valgus (acquired), right foot: Secondary | ICD-10-CM

## 2020-08-26 DIAGNOSIS — M2141 Flat foot [pes planus] (acquired), right foot: Secondary | ICD-10-CM

## 2020-08-26 DIAGNOSIS — M21611 Bunion of right foot: Secondary | ICD-10-CM | POA: Diagnosis not present

## 2020-08-26 NOTE — Progress Notes (Signed)
  Subjective:  Patient ID: Sharon Wise, female    DOB: 11-18-1950,  MRN: [id number]  Chief Complaint  Patient presents with   Routine Post Op    POV #4 -pt deneis N/F/Ch -pt states," feel good, with some pain; 5/10 occaional pain." - no swelling/other complaints tx: sx shoe, cane, lotion and icing    DOS: 07/10/20 Procedure: [place] bunionectomy right, STJ arthroscopy  70 y.o. female presents with the above complaint. History confirmed with patient.   Objective:  Physical Exam: no tenderness at the surgical site, no edema noted, and calf supple, nontender. Incision: healing well, no significant drainage, no dehiscence  X-Rays: taken and reviewed.  Inferior aspect of osteotomy healed with slight lag dorsally Assessment:   1. Post-operative state   2. 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 Pes planus of both feet    Plan:  Patient was evaluated and treated and all questions answered.  Post-operative State -X-rays taken reviewed sufficient healing to progress weightbearing.  Patient to slowly transition into normal shoe gear and follow-up in 1 month for recheck with repeat x-rays -XRs needed at follow-up: 3 view Foot   No follow-ups on file.

## 2020-09-26 ENCOUNTER — Ambulatory Visit (INDEPENDENT_AMBULATORY_CARE_PROVIDER_SITE_OTHER): Payer: Medicare Other | Admitting: Podiatry

## 2020-09-26 ENCOUNTER — Encounter: Payer: Self-pay | Admitting: Podiatry

## 2020-09-26 ENCOUNTER — Other Ambulatory Visit: Payer: Self-pay

## 2020-09-26 ENCOUNTER — Ambulatory Visit (INDEPENDENT_AMBULATORY_CARE_PROVIDER_SITE_OTHER): Payer: Medicare Other

## 2020-09-26 DIAGNOSIS — M21611 Bunion of right foot: Secondary | ICD-10-CM

## 2020-09-26 DIAGNOSIS — M2011 Hallux valgus (acquired), right foot: Secondary | ICD-10-CM

## 2020-09-26 DIAGNOSIS — M2012 Hallux valgus (acquired), left foot: Secondary | ICD-10-CM

## 2020-09-26 DIAGNOSIS — M21612 Bunion of left foot: Secondary | ICD-10-CM

## 2020-09-26 DIAGNOSIS — M79676 Pain in unspecified toe(s): Secondary | ICD-10-CM | POA: Diagnosis not present

## 2020-09-26 DIAGNOSIS — B351 Tinea unguium: Secondary | ICD-10-CM

## 2020-09-26 NOTE — Progress Notes (Addendum)
  Subjective:  Patient ID: Sharon Wise, female    DOB: 05-Oct-1950,  MRN: [id number]  Chief Complaint  Patient presents with   Nail Problem    Trim nails and they do hurt   Routine Post Op    I am doing better on the right foot    DOS: 07/10/20 Procedure: [place] bunionectomy right, STJ arthroscopy  70 y.o. female presents with the above complaint. History confirmed with patient. Complains more of left foot bunion pain today, right foot is doing well. She is back in normal shoegear without issues, but the left still causes pain. She is interested in surgical discussion when appropriate.  Objective:  Physical Exam: R 1st MPJ with good ROM and no pain. No pain at STJ. Mild POP dorsal 1st met at area of likely pin. The pin itself is not prominent. Bilateral hallux nails with ingrowing aspects and POP. Other nails without ingrown nails. Left foot HAV deformity with POP. Incision: fully healed.  X-Rays: taken and reviewed.  Osteotomy appears well healed with slightly prominent pin dorsally. Assessment:   1. 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 Pain due to onychomycosis of toenail    Plan:  Patient was evaluated and treated and all questions answered.  Post-operative State -XR well healed osteotomy. Pin does appear slightly prominent. -She has some point tenderness at the pin site but otherwise no pain at the joint and good ROM.  -She would benefit from pin removal, will plan to do so concomitant with left foot bunion -Discuss bunionectomy left, ROH right at next visit. -XRs needed at follow-up: 3 view Foot  Onychomycosis with pain -Nails debrided x10, slant back of ingrowns bilateral hallux.  Return in about 6 weeks (around 11/07/2020) for Left foot bunion surgical eval.

## 2020-11-07 ENCOUNTER — Encounter: Payer: Self-pay | Admitting: Podiatry

## 2020-11-07 ENCOUNTER — Ambulatory Visit (INDEPENDENT_AMBULATORY_CARE_PROVIDER_SITE_OTHER): Payer: Medicare Other | Admitting: Podiatry

## 2020-11-07 ENCOUNTER — Ambulatory Visit (INDEPENDENT_AMBULATORY_CARE_PROVIDER_SITE_OTHER): Payer: Medicare Other

## 2020-11-07 ENCOUNTER — Other Ambulatory Visit: Payer: Self-pay

## 2020-11-07 DIAGNOSIS — Z969 Presence of functional implant, unspecified: Secondary | ICD-10-CM

## 2020-11-07 DIAGNOSIS — B353 Tinea pedis: Secondary | ICD-10-CM | POA: Diagnosis not present

## 2020-11-07 DIAGNOSIS — M21612 Bunion of left foot: Secondary | ICD-10-CM | POA: Diagnosis not present

## 2020-11-07 DIAGNOSIS — M2012 Hallux valgus (acquired), left foot: Secondary | ICD-10-CM

## 2020-11-07 MED ORDER — KETOCONAZOLE 2 % EX CREA: TOPICAL_CREAM | CUTANEOUS | 0 refills | Status: DC

## 2020-11-07 NOTE — Progress Notes (Signed)
  Subjective:  Patient ID: Sharon Wise, female    DOB: February 15, 1950,  MRN: [id number]  No chief complaint on file.  DOS: 07/10/20 Procedure: [place] bunionectomy right, STJ arthroscopy  70 y.o. female presents with the above complaint. History confirmed with patient.  Would like to discuss surgical correction previously discussed.  Her left foot is causing pain and she cannot wear shoes comfortably.  Her right foot is giving her no issues but she does have a little bit of pain when pressed on the area of the pain to the right foot  Objective:  Physical Exam: R 1st MPJ with good ROM and no pain. No pain at STJ. Mild POP dorsal 1st met at area of likely pin. The pin itself is not prominent. Left foot HAV deformity with POP.  Relatively preserved range of motion of the joint without crepitus or pain on range of motion Incision: fully healed. Xerosis with scaling to the plantar foot bilat  X-Rays: taken and reviewed left foot.  Prominent medial exostosis with cystic change, subchondral sclerosis of the joint Assessment:   1. Hallux valgus with bunions, left    Plan:  Patient was evaluated and treated and all questions answered.  Hallux valgus left; retained hardware right -X-rays reviewed with patient -Given large cyst as well as decreased bone stock noted on previous surgery we will plan for simple modified McBride bunionectomy.  I do not think she is a good candidate for fusion or replacement of the joint. Patient's goal is to wear shoes comfortably and I think this will achieve that.  We will plan for concomitant removal of the pin on the right foot. -Patient has failed conservative therapy and wishes to proceed with surgical intervention. All risks, benefits, and alternatives discussed with patient. No guarantees given. Consent reviewed and signed by patient. -Planned procedures: Removal of hardware right foot, modified McBride bunionectomy left   Tinea Pedis -Rx ketoconazole.  Educated on use.  No follow-ups on file.

## 2020-11-14 ENCOUNTER — Telehealth: Payer: Self-pay | Admitting: Urology

## 2020-11-14 NOTE — Telephone Encounter (Signed)
DOS - 11/27/20  [place] BUNIONECTOMY LEFT --- 99833 REMOVAL FIXATION DEEP LEFT --- 20680  [facility] EFFECTIVE DATE - 04/19/20  PLAN DEDUCTIBLE - $0.00 OUT OF POCKET - $7,550.00 W/ $8,250.53 REMAINING COINSURANCE - 20% COPAY - $0.00   PER [employer] WEB SITE FOR CPT CODE 97673 AND 41937 Notification or Prior Authorization is not required for the requested services  Decision ID #:[id number]

## 2020-11-27 ENCOUNTER — Other Ambulatory Visit: Payer: Self-pay | Admitting: Podiatry

## 2020-11-27 DIAGNOSIS — Z4889 Encounter for other specified surgical aftercare: Secondary | ICD-10-CM | POA: Diagnosis not present

## 2020-11-27 DIAGNOSIS — M2012 Hallux valgus (acquired), left foot: Secondary | ICD-10-CM | POA: Diagnosis not present

## 2020-11-27 MED ORDER — OXYCODONE-ACETAMINOPHEN 5-325 MG PO TABS: 1.0000 | ORAL_TABLET | ORAL | 0 refills | Status: DC | PRN

## 2020-11-27 MED ORDER — CEPHALEXIN 500 MG PO CAPS: ORAL_CAPSULE | ORAL | 0 refills | Status: DC

## 2020-12-02 ENCOUNTER — Ambulatory Visit (INDEPENDENT_AMBULATORY_CARE_PROVIDER_SITE_OTHER): Payer: Medicare Other

## 2020-12-02 ENCOUNTER — Ambulatory Visit: Payer: Medicare Other

## 2020-12-02 ENCOUNTER — Ambulatory Visit (INDEPENDENT_AMBULATORY_CARE_PROVIDER_SITE_OTHER): Payer: Medicare Other | Admitting: Podiatry

## 2020-12-02 ENCOUNTER — Other Ambulatory Visit: Payer: Self-pay

## 2020-12-02 DIAGNOSIS — M79671 Pain in right foot: Secondary | ICD-10-CM | POA: Diagnosis not present

## 2020-12-02 DIAGNOSIS — M2012 Hallux valgus (acquired), left foot: Secondary | ICD-10-CM

## 2020-12-02 DIAGNOSIS — M21612 Bunion of left foot: Secondary | ICD-10-CM

## 2020-12-02 MED ORDER — OXYCODONE-ACETAMINOPHEN 5-325 MG PO TABS: 1.0000 | ORAL_TABLET | ORAL | 0 refills | Status: DC | PRN

## 2020-12-02 NOTE — Progress Notes (Signed)
  Subjective:  Patient ID: Sharon Wise, female    DOB: 1950/05/30,  MRN: [id number]  Chief Complaint  Patient presents with   Routine Post Op    POV#1 -pt denies N/F/?Ch -dressing iontact -per pt pain is relieved w/ pain med    DOS: 11/27/20 Procedure: Removal of pin right foot, silver bunionectomy left foot   70 y.o. female presents with the above complaint. History confirmed with patient. Denies post-op issues or concerns; requests refill of pain medication today.  Objective:  Physical Exam: tenderness at the surgical site, local edema noted, and calf supple, nontender. Incision: healing well, no significant drainage, no dehiscence, no significant erythema  No images are attached to the encounter.  Radiographs: X-ray of both feet: consistent with post-op state, with good alignment and no evidence of hardware complication  Assessment:   1. Hallux valgus with bunions, left     Plan:  Patient was evaluated and treated and all questions answered.  Post-operative State -XR reviewed with patient -Dressing applied consisting of sterile gauze, kerlix, and ACE bandage -WBAT in Surgical shoe -Pain medication refilled -XRs needed at follow-up: none  Return in about 1 week (around 12/09/2020) for Post-Op (No XRs).

## 2020-12-09 ENCOUNTER — Ambulatory Visit (INDEPENDENT_AMBULATORY_CARE_PROVIDER_SITE_OTHER): Payer: Medicare Other | Admitting: Podiatry

## 2020-12-09 DIAGNOSIS — Z9889 Other specified postprocedural states: Secondary | ICD-10-CM

## 2020-12-09 DIAGNOSIS — Z969 Presence of functional implant, unspecified: Secondary | ICD-10-CM

## 2020-12-09 DIAGNOSIS — M21612 Bunion of left foot: Secondary | ICD-10-CM

## 2020-12-09 DIAGNOSIS — M2012 Hallux valgus (acquired), left foot: Secondary | ICD-10-CM

## 2020-12-09 NOTE — Progress Notes (Signed)
  Subjective:  Patient ID: Sharon Wise, female    DOB: 01/15/1951,  MRN: [id number]  Chief Complaint  Patient presents with   Routine Post Op    POV #2 -pt denies N/V/fCh -pt states," doing alright, they're still very sore ; 8/10 pain." - pt states she had to loosen Lt dressing  last night - Tx: sx shoe, tylenol, elevation and icing    DOS: 11/27/20 Procedure: Removal of pin right foot, silver bunionectomy left foot   70 y.o. female presents with the above complaint. History confirmed with patient.   Objective:  Physical Exam: tenderness at the surgical site, local edema noted, and calf supple, nontender. Incision: healing well, no significant drainage, no dehiscence, no significant erythema  Assessment:   1. Hallux valgus with bunions, left   2. Presence of retained hardware   3. Post-operative state     Plan:  Patient was evaluated and treated and all questions answered.  Post-operative State -Sutures removed -Ok to start showering at this time. Advised they cannot soak. -WBAT in Surgical shoe -No need for pain med refill today. -Ok to transition to normal shoes in 2 weeks -XRs needed at follow-up: none  No follow-ups on file.

## 2020-12-19 ENCOUNTER — Encounter: Payer: Medicare Other | Admitting: Podiatry

## 2020-12-23 DIAGNOSIS — E876 Hypokalemia: Secondary | ICD-10-CM | POA: Insufficient documentation

## 2020-12-23 HISTORY — DX: Hypokalemia: E87.6

## 2020-12-30 ENCOUNTER — Ambulatory Visit (INDEPENDENT_AMBULATORY_CARE_PROVIDER_SITE_OTHER): Payer: Medicare Other | Admitting: Podiatry

## 2020-12-30 ENCOUNTER — Encounter: Payer: Self-pay | Admitting: Podiatry

## 2020-12-30 DIAGNOSIS — Z969 Presence of functional implant, unspecified: Secondary | ICD-10-CM

## 2020-12-30 DIAGNOSIS — M2012 Hallux valgus (acquired), left foot: Secondary | ICD-10-CM

## 2020-12-30 DIAGNOSIS — M21612 Bunion of left foot: Secondary | ICD-10-CM

## 2020-12-30 DIAGNOSIS — Z9889 Other specified postprocedural states: Secondary | ICD-10-CM

## 2020-12-30 MED ORDER — KETOCONAZOLE 2 % EX CREA: TOPICAL_CREAM | CUTANEOUS | 0 refills | Status: DC

## 2020-12-30 NOTE — Progress Notes (Signed)
  Subjective:  Patient ID: Sharon Wise, female    DOB: Jul 31, 1950,  MRN: [id number]  Chief Complaint  Patient presents with   Routine Post Op    The right foot is doing good and the shoes don't feel like they got enough support and the left is ok and I am trying the regular shoe on and tender at times and the skin is peeling   DOS: 11/27/20 Procedure: Removal of pin right foot, silver bunionectomy left foot   70 y.o. female presents with the above complaint. History confirmed with patient.   Objective:  Physical Exam: mild tenderness left, no tenderness right. Mild edema left, mildly decreased ROM, reducible on manual stretch. Incision: healed  Assessment:   1. Hallux valgus with bunions, left   2. Presence of retained hardware   3. Post-operative state    Plan:  Patient was evaluated and treated and all questions answered.  Post-operative State -No need for pain med refill today. -Continue stretching exercises. -Continue normal shoegear. Dispense powerstep inserts to help with general foot support. -XRs needed at follow-up: none  Return in about 6 weeks (around 02/10/2021).

## 2020-12-30 NOTE — Patient Instructions (Signed)

## 2021-01-24 DIAGNOSIS — J208 Acute bronchitis due to other specified organisms: Secondary | ICD-10-CM | POA: Diagnosis not present

## 2021-01-24 DIAGNOSIS — J019 Acute sinusitis, unspecified: Secondary | ICD-10-CM | POA: Diagnosis not present

## 2021-01-24 DIAGNOSIS — K219 Gastro-esophageal reflux disease without esophagitis: Secondary | ICD-10-CM | POA: Diagnosis not present

## 2021-01-24 DIAGNOSIS — B9689 Other specified bacterial agents as the cause of diseases classified elsewhere: Secondary | ICD-10-CM | POA: Diagnosis not present

## 2021-02-06 DIAGNOSIS — G309 Alzheimer's disease, unspecified: Secondary | ICD-10-CM | POA: Diagnosis not present

## 2021-02-06 DIAGNOSIS — I4891 Unspecified atrial fibrillation: Secondary | ICD-10-CM | POA: Diagnosis not present

## 2021-02-06 DIAGNOSIS — E7800 Pure hypercholesterolemia, unspecified: Secondary | ICD-10-CM | POA: Diagnosis not present

## 2021-02-06 DIAGNOSIS — F0280 Dementia in other diseases classified elsewhere without behavioral disturbance: Secondary | ICD-10-CM | POA: Diagnosis not present

## 2021-02-06 DIAGNOSIS — Z6828 Body mass index (BMI) 28.0-28.9, adult: Secondary | ICD-10-CM | POA: Diagnosis not present

## 2021-02-06 DIAGNOSIS — M549 Dorsalgia, unspecified: Secondary | ICD-10-CM | POA: Diagnosis not present

## 2021-02-06 DIAGNOSIS — G8929 Other chronic pain: Secondary | ICD-10-CM | POA: Diagnosis not present

## 2021-02-06 DIAGNOSIS — Z79899 Other long term (current) drug therapy: Secondary | ICD-10-CM | POA: Diagnosis not present

## 2021-02-06 DIAGNOSIS — R7303 Prediabetes: Secondary | ICD-10-CM | POA: Diagnosis not present

## 2021-02-10 ENCOUNTER — Encounter: Payer: Self-pay | Admitting: Podiatry

## 2021-02-10 ENCOUNTER — Ambulatory Visit (INDEPENDENT_AMBULATORY_CARE_PROVIDER_SITE_OTHER): Payer: Medicare HMO | Admitting: Podiatry

## 2021-02-10 ENCOUNTER — Other Ambulatory Visit: Payer: Self-pay

## 2021-02-10 DIAGNOSIS — M21612 Bunion of left foot: Secondary | ICD-10-CM

## 2021-02-10 DIAGNOSIS — M2012 Hallux valgus (acquired), left foot: Secondary | ICD-10-CM

## 2021-02-10 DIAGNOSIS — Z9889 Other specified postprocedural states: Secondary | ICD-10-CM

## 2021-02-10 DIAGNOSIS — Z969 Presence of functional implant, unspecified: Secondary | ICD-10-CM

## 2021-02-10 MED ORDER — KETOCONAZOLE 2 % EX CREA: TOPICAL_CREAM | CUTANEOUS | 0 refills | Status: DC

## 2021-02-10 NOTE — Progress Notes (Signed)
°  Subjective:  Patient ID: SCHAE CANDO, female    DOB: [birth year],  MRN: [id number]  Chief Complaint  Patient presents with   Routine Post Op    The left foot hurts some with shoes and is sore at times but not as bad and there is some swelling   DOS: 11/27/20 Procedure: Removal of pin right foot, silver bunionectomy left foot   71 y.o. female presents with the above complaint. History confirmed with patient. Doing well overall has a little discomfort in some shoes but overall doing well.  Objective:  Physical Exam: mild tenderness left, no tenderness right. Mild edema left, mildly decreased ROM, reducible on manual stretch. Incision: healed  Assessment:   1. Hallux valgus with bunions, left   2. Presence of retained hardware   3. Post-operative state    Plan:  Patient was evaluated and treated and all questions answered.  Post-operative State -Doing very well overall. Discussed that the toe is still a little tight, educated on stretching of the 1st metatarsophalangeal joint. -Would benefit from toe alignment splint - unable to dispense today as out of stock. Will contact patient when available for pickup. -XRs needed at follow-up: none  Return in about 6 weeks (around 03/24/2021) for Post-Op (No XRs).

## 2021-02-12 DIAGNOSIS — F419 Anxiety disorder, unspecified: Secondary | ICD-10-CM | POA: Diagnosis not present

## 2021-02-12 DIAGNOSIS — Z79899 Other long term (current) drug therapy: Secondary | ICD-10-CM | POA: Diagnosis not present

## 2021-02-12 DIAGNOSIS — D649 Anemia, unspecified: Secondary | ICD-10-CM | POA: Diagnosis not present

## 2021-02-12 DIAGNOSIS — R7303 Prediabetes: Secondary | ICD-10-CM | POA: Diagnosis not present

## 2021-02-12 DIAGNOSIS — R252 Cramp and spasm: Secondary | ICD-10-CM | POA: Diagnosis not present

## 2021-02-12 DIAGNOSIS — E7800 Pure hypercholesterolemia, unspecified: Secondary | ICD-10-CM | POA: Diagnosis not present

## 2021-02-12 DIAGNOSIS — G309 Alzheimer's disease, unspecified: Secondary | ICD-10-CM | POA: Diagnosis not present

## 2021-02-12 DIAGNOSIS — M47816 Spondylosis without myelopathy or radiculopathy, lumbar region: Secondary | ICD-10-CM | POA: Diagnosis not present

## 2021-02-12 DIAGNOSIS — I129 Hypertensive chronic kidney disease with stage 1 through stage 4 chronic kidney disease, or unspecified chronic kidney disease: Secondary | ICD-10-CM | POA: Diagnosis not present

## 2021-02-12 DIAGNOSIS — Z6829 Body mass index (BMI) 29.0-29.9, adult: Secondary | ICD-10-CM | POA: Diagnosis not present

## 2021-02-12 DIAGNOSIS — F01A0 Vascular dementia, mild, without behavioral disturbance, psychotic disturbance, mood disturbance, and anxiety: Secondary | ICD-10-CM | POA: Diagnosis not present

## 2021-02-12 DIAGNOSIS — F0280 Dementia in other diseases classified elsewhere without behavioral disturbance: Secondary | ICD-10-CM | POA: Diagnosis not present

## 2021-02-12 DIAGNOSIS — E559 Vitamin D deficiency, unspecified: Secondary | ICD-10-CM | POA: Diagnosis not present

## 2021-03-24 ENCOUNTER — Encounter: Payer: Medicare HMO | Admitting: Podiatry

## 2021-04-07 ENCOUNTER — Encounter: Payer: Self-pay | Admitting: Podiatry

## 2021-04-30 ENCOUNTER — Other Ambulatory Visit: Payer: Self-pay | Admitting: Cardiology

## 2021-05-29 DIAGNOSIS — I129 Hypertensive chronic kidney disease with stage 1 through stage 4 chronic kidney disease, or unspecified chronic kidney disease: Secondary | ICD-10-CM | POA: Diagnosis not present

## 2021-05-29 DIAGNOSIS — F32A Depression, unspecified: Secondary | ICD-10-CM | POA: Diagnosis not present

## 2021-05-29 DIAGNOSIS — Z79899 Other long term (current) drug therapy: Secondary | ICD-10-CM | POA: Diagnosis not present

## 2021-05-29 DIAGNOSIS — N183 Chronic kidney disease, stage 3 unspecified: Secondary | ICD-10-CM | POA: Diagnosis not present

## 2021-05-29 DIAGNOSIS — M1990 Unspecified osteoarthritis, unspecified site: Secondary | ICD-10-CM | POA: Diagnosis not present

## 2021-05-29 DIAGNOSIS — R531 Weakness: Secondary | ICD-10-CM | POA: Diagnosis not present

## 2021-05-29 DIAGNOSIS — E7800 Pure hypercholesterolemia, unspecified: Secondary | ICD-10-CM | POA: Diagnosis not present

## 2021-05-29 DIAGNOSIS — J069 Acute upper respiratory infection, unspecified: Secondary | ICD-10-CM | POA: Diagnosis not present

## 2021-05-29 DIAGNOSIS — K219 Gastro-esophageal reflux disease without esophagitis: Secondary | ICD-10-CM | POA: Diagnosis not present

## 2021-05-29 DIAGNOSIS — N189 Chronic kidney disease, unspecified: Secondary | ICD-10-CM | POA: Diagnosis not present

## 2021-05-29 DIAGNOSIS — I1310 Hypertensive heart and chronic kidney disease without heart failure, with stage 1 through stage 4 chronic kidney disease, or unspecified chronic kidney disease: Secondary | ICD-10-CM | POA: Diagnosis not present

## 2021-05-29 DIAGNOSIS — R0602 Shortness of breath: Secondary | ICD-10-CM | POA: Diagnosis not present

## 2021-05-29 DIAGNOSIS — Z880 Allergy status to penicillin: Secondary | ICD-10-CM | POA: Diagnosis not present

## 2021-05-29 DIAGNOSIS — R0789 Other chest pain: Secondary | ICD-10-CM | POA: Diagnosis not present

## 2021-05-29 DIAGNOSIS — J45909 Unspecified asthma, uncomplicated: Secondary | ICD-10-CM | POA: Diagnosis not present

## 2021-05-30 DIAGNOSIS — N183 Chronic kidney disease, stage 3 unspecified: Secondary | ICD-10-CM | POA: Diagnosis not present

## 2021-05-30 DIAGNOSIS — J069 Acute upper respiratory infection, unspecified: Secondary | ICD-10-CM | POA: Diagnosis not present

## 2021-05-30 DIAGNOSIS — R079 Chest pain, unspecified: Secondary | ICD-10-CM | POA: Diagnosis not present

## 2021-05-30 DIAGNOSIS — R0789 Other chest pain: Secondary | ICD-10-CM | POA: Diagnosis not present

## 2021-06-10 DIAGNOSIS — I129 Hypertensive chronic kidney disease with stage 1 through stage 4 chronic kidney disease, or unspecified chronic kidney disease: Secondary | ICD-10-CM | POA: Diagnosis not present

## 2021-06-10 DIAGNOSIS — N183 Chronic kidney disease, stage 3 unspecified: Secondary | ICD-10-CM | POA: Diagnosis not present

## 2021-06-10 DIAGNOSIS — R7303 Prediabetes: Secondary | ICD-10-CM | POA: Diagnosis not present

## 2021-06-10 DIAGNOSIS — E785 Hyperlipidemia, unspecified: Secondary | ICD-10-CM | POA: Diagnosis not present

## 2021-06-10 DIAGNOSIS — J069 Acute upper respiratory infection, unspecified: Secondary | ICD-10-CM | POA: Diagnosis not present

## 2021-06-10 DIAGNOSIS — R0789 Other chest pain: Secondary | ICD-10-CM | POA: Diagnosis not present

## 2021-06-17 ENCOUNTER — Other Ambulatory Visit: Payer: Self-pay | Admitting: Cardiology

## 2021-06-23 DIAGNOSIS — D631 Anemia in chronic kidney disease: Secondary | ICD-10-CM | POA: Diagnosis not present

## 2021-06-23 DIAGNOSIS — N1832 Chronic kidney disease, stage 3b: Secondary | ICD-10-CM | POA: Diagnosis not present

## 2021-06-23 DIAGNOSIS — G8929 Other chronic pain: Secondary | ICD-10-CM | POA: Diagnosis not present

## 2021-06-23 DIAGNOSIS — M545 Low back pain, unspecified: Secondary | ICD-10-CM | POA: Diagnosis not present

## 2021-06-23 DIAGNOSIS — E559 Vitamin D deficiency, unspecified: Secondary | ICD-10-CM | POA: Diagnosis not present

## 2021-06-23 DIAGNOSIS — M898X9 Other specified disorders of bone, unspecified site: Secondary | ICD-10-CM | POA: Diagnosis not present

## 2021-06-23 DIAGNOSIS — I129 Hypertensive chronic kidney disease with stage 1 through stage 4 chronic kidney disease, or unspecified chronic kidney disease: Secondary | ICD-10-CM | POA: Diagnosis not present

## 2021-06-23 DIAGNOSIS — N183 Chronic kidney disease, stage 3 unspecified: Secondary | ICD-10-CM | POA: Diagnosis not present

## 2021-07-07 IMAGING — CT CT VIRTUAL COLONOSCOPY DIAGNOSTIC
3 of 13 series · 11 of 46 positions shown, 17 images · non-contrast
Comparison: Abdominal ultrasound 05/02/2008. 06/16/2018
abdominopelvic CT from [facility].

CLINICAL DATA: Blood in stool for 2 months.  Prior hysterectomy.

EXAM:
CT VIRTUAL COLONOSCOPY DIAGNOSTIC
TECHNIQUE: The patient was given a standard bowel preparation with Gastrografin
and barium for fluid and stool tagging respectively. The quality of
the bowel preparation is moderate. Automated CO2 insufflation of the
colon was performed prior to image acquisition and colonic
distention is moderate. Image post processing was used to generate a
3D endoluminal fly-through projection of the colon and to
electronically subtract stool/fluid as appropriate.

[Series 5: supine colon 1.50 br40 s3 supine thins · axial · 0.85mm/px · z∈[+1278,+1587]mm · 4 of 344 slices shown]
[im 69/344  soft-tissue]
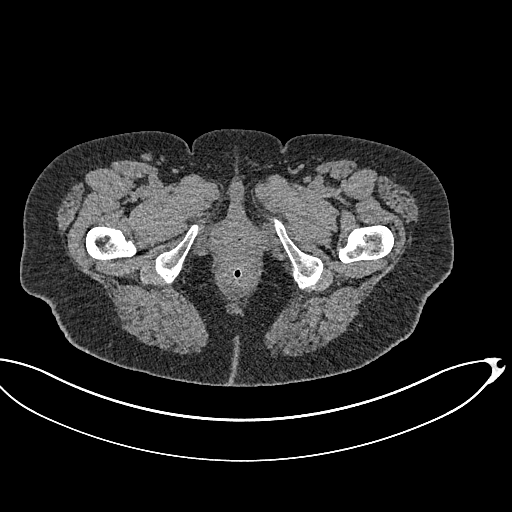
[im 138/344  soft-tissue]
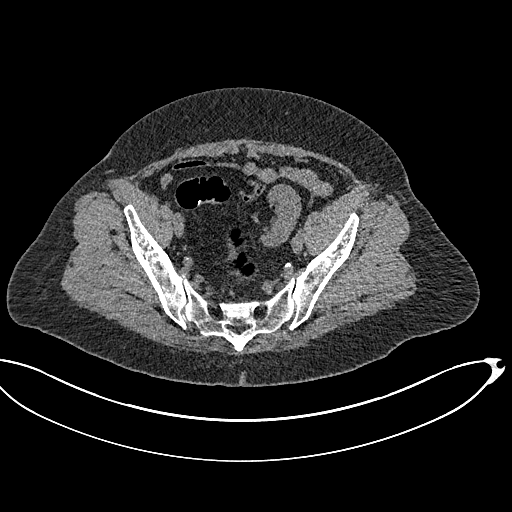
[im 206/344  soft-tissue]
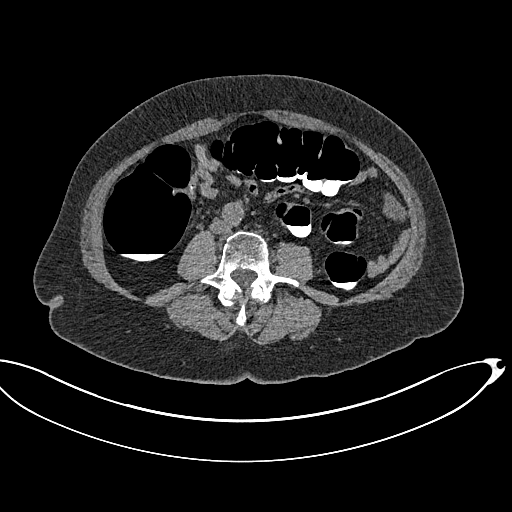
[im 275/344  soft-tissue]
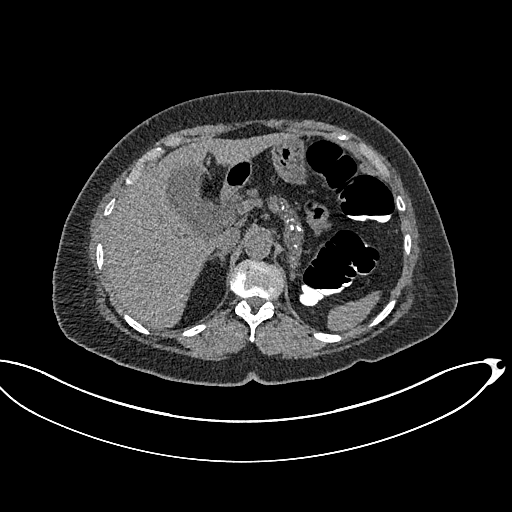

[Series 12: prone colon 1.50 br40 s3 prone thin · axial · 0.82mm/px · z∈[+1223,+1571]mm · 5 of 348 slices shown, 10 images]
[im 58/348  soft-tissue]
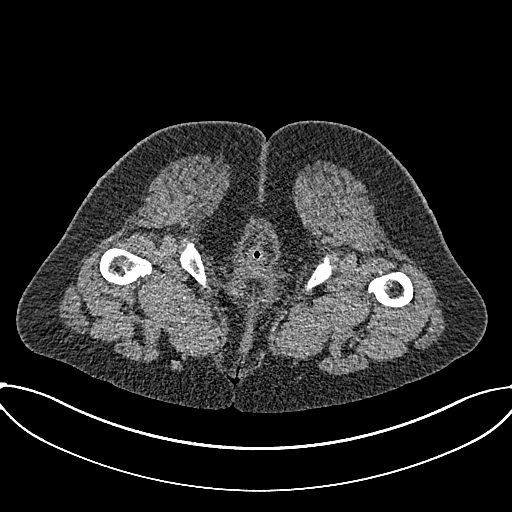
[im 58/348  bone]
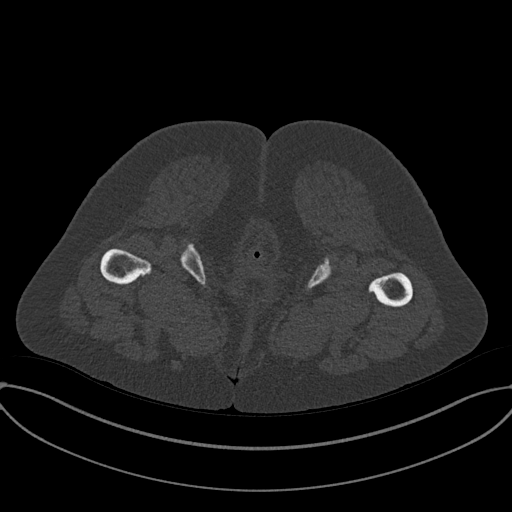
[im 116/348  soft-tissue]
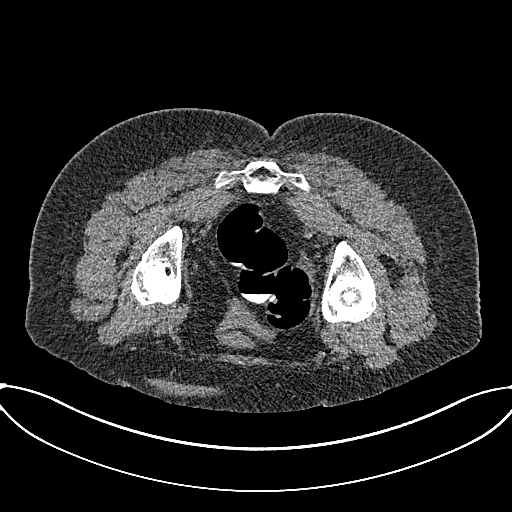
[im 116/348  lung]
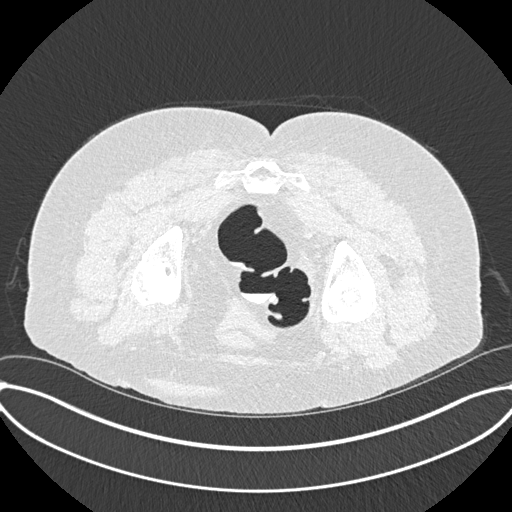
[im 174/348  soft-tissue]
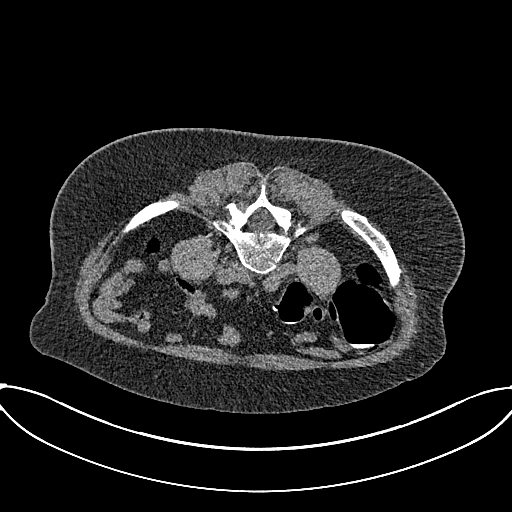
[im 174/348  lung]
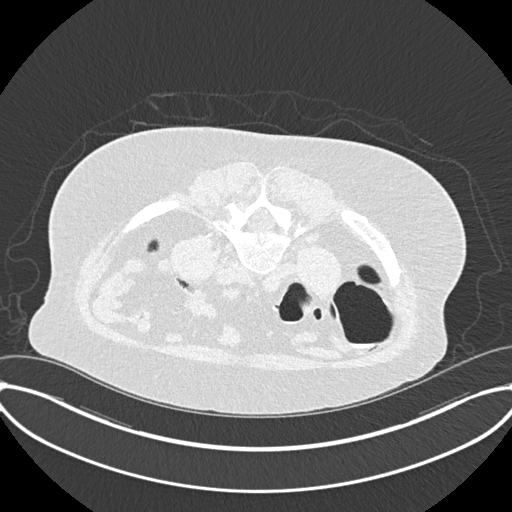
[im 232/348  soft-tissue]
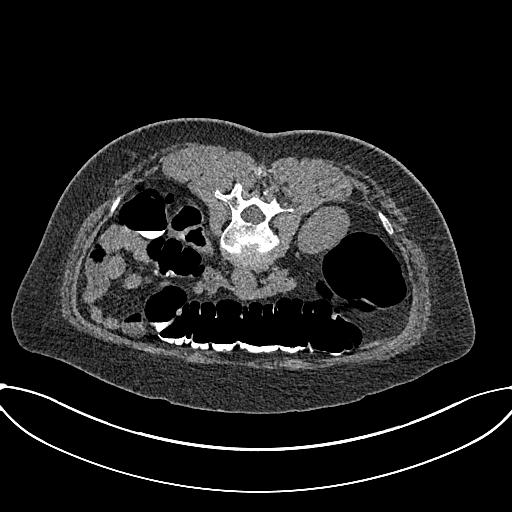
[im 232/348  lung]
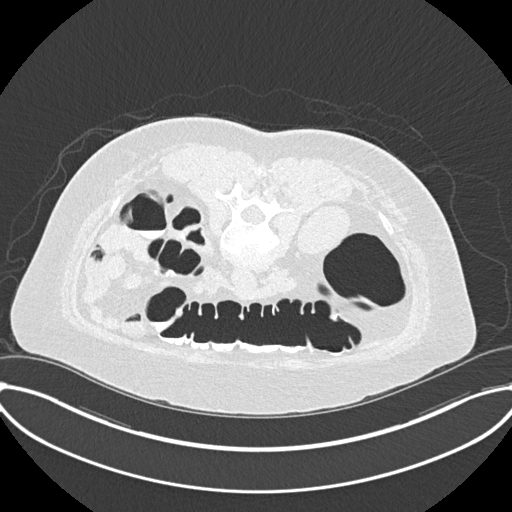
[im 290/348  soft-tissue]
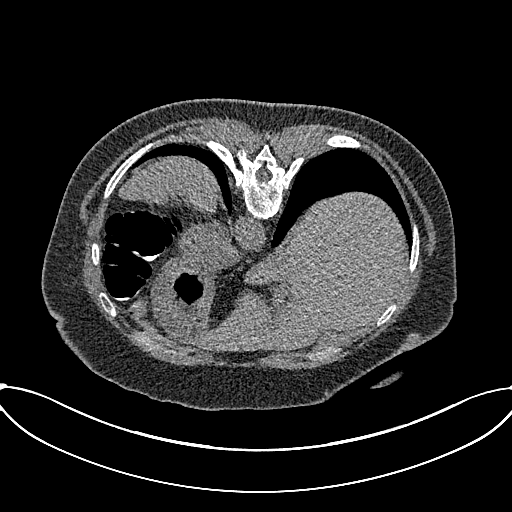
[im 290/348  lung]
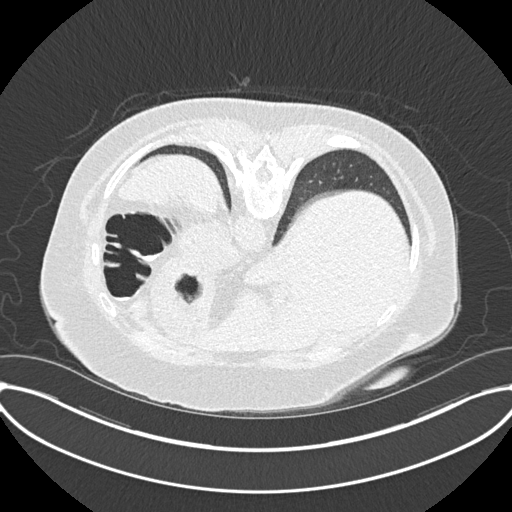

[Series 20: rt decub colon 3.00 br40 s3 cor rt decub · coronal · 0.68mm/px · 2 of 129 slices shown, 3 images]
[im 43/129  soft-tissue]
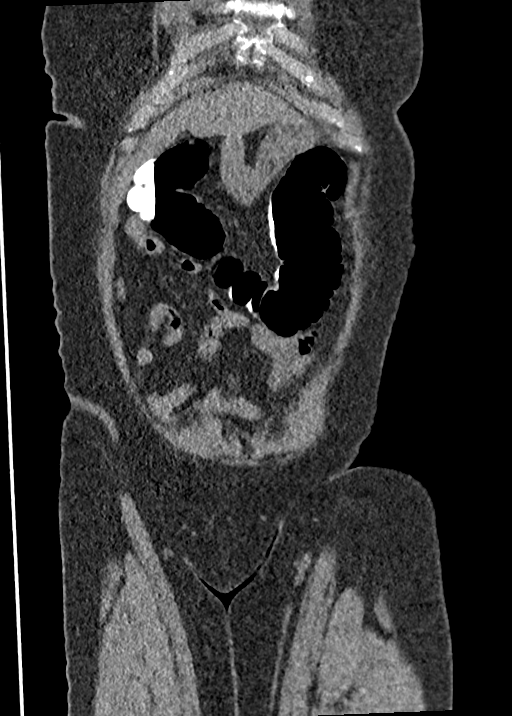
[im 43/129  bone]
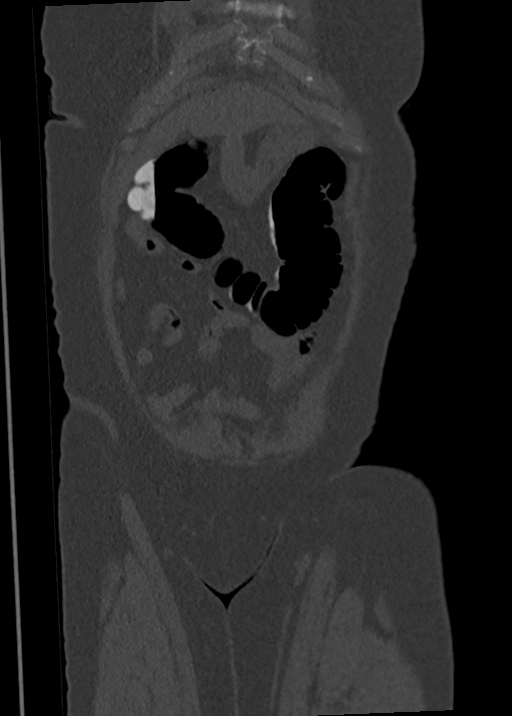
[im 86/129  soft-tissue]
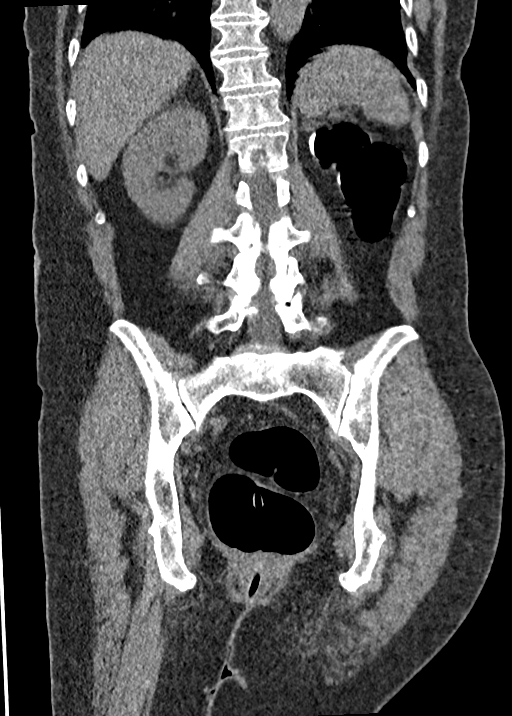

[11 of 46 positions shown; findings below may reference images not displayed]

FINDINGS: VIRTUAL COLONOSCOPY

Especially on supine images, the sigmoid is suboptimally distended,
without evidence of dominant mass. From the descending colon
proximally, there is a moderate amount of retained contrast but no
evidence of clinically significant colonic polyp or mass. Foci of
hyperattenuation within the transverse colon, including at 4 mm on
128/5 are favored to represent retained tagged stool.

Virtual colonoscopy is not designed to detect diminutive polyps
(i.e., less than or equal to 5 mm), the presence or absence of which
may not affect clinical management.

CT ABDOMEN AND PELVIS WITHOUT CONTRAST

Lower chest: Clear lung bases. Mild cardiomegaly. Tiny hiatal
hernia.

Hepatobiliary: Normal liver. Normal gallbladder, without biliary
ductal dilatation.

Pancreas: Chronic calcific pancreatitis.

Spleen: Normal in size, without focal abnormality.

Adrenals/Urinary Tract: Normal adrenal glands. Left pelvic kidney.
No renal calculi or hydronephrosis. Decompressed urinary bladder. No
bladder calculi.

Stomach/Bowel: Proximal gastric underdistention. Normal small bowel.

Vascular/Lymphatic: Aortic atherosclerosis. No abdominopelvic
adenopathy.

Reproductive: Hysterectomy.  No adnexal mass.

Other: No significant free fluid.  No free intraperitoneal air.

Musculoskeletal: No acute osseous abnormality.
IMPRESSION: 1. Mild limitations, primarily involving the sigmoid. Given this
factor, no evidence of clinically significant colonic polyp or mass.
2. Chronic calcific pancreatitis.
3.  Aortic Atherosclerosis ([id number]).

## 2021-07-18 ENCOUNTER — Other Ambulatory Visit: Payer: Self-pay | Admitting: Cardiology

## 2021-08-06 DIAGNOSIS — M79642 Pain in left hand: Secondary | ICD-10-CM | POA: Diagnosis not present

## 2021-08-06 DIAGNOSIS — G4733 Obstructive sleep apnea (adult) (pediatric): Secondary | ICD-10-CM | POA: Diagnosis not present

## 2021-08-06 DIAGNOSIS — M545 Low back pain, unspecified: Secondary | ICD-10-CM | POA: Diagnosis not present

## 2021-08-06 DIAGNOSIS — R600 Localized edema: Secondary | ICD-10-CM | POA: Diagnosis not present

## 2021-08-06 DIAGNOSIS — M79641 Pain in right hand: Secondary | ICD-10-CM | POA: Diagnosis not present

## 2021-08-11 DIAGNOSIS — M47816 Spondylosis without myelopathy or radiculopathy, lumbar region: Secondary | ICD-10-CM | POA: Diagnosis not present

## 2021-08-11 DIAGNOSIS — G2581 Restless legs syndrome: Secondary | ICD-10-CM | POA: Insufficient documentation

## 2021-08-11 DIAGNOSIS — F01A0 Vascular dementia, mild, without behavioral disturbance, psychotic disturbance, mood disturbance, and anxiety: Secondary | ICD-10-CM | POA: Diagnosis not present

## 2021-08-11 DIAGNOSIS — G5601 Carpal tunnel syndrome, right upper limb: Secondary | ICD-10-CM | POA: Diagnosis not present

## 2021-08-11 DIAGNOSIS — Z79899 Other long term (current) drug therapy: Secondary | ICD-10-CM | POA: Diagnosis not present

## 2021-08-11 HISTORY — DX: Restless legs syndrome: G25.81

## 2021-08-14 ENCOUNTER — Other Ambulatory Visit: Payer: Self-pay | Admitting: Cardiology

## 2021-08-22 DIAGNOSIS — J208 Acute bronchitis due to other specified organisms: Secondary | ICD-10-CM | POA: Diagnosis not present

## 2021-08-22 DIAGNOSIS — J019 Acute sinusitis, unspecified: Secondary | ICD-10-CM | POA: Diagnosis not present

## 2021-09-03 DIAGNOSIS — J301 Allergic rhinitis due to pollen: Secondary | ICD-10-CM | POA: Diagnosis not present

## 2021-09-03 DIAGNOSIS — G2581 Restless legs syndrome: Secondary | ICD-10-CM | POA: Diagnosis not present

## 2021-09-03 DIAGNOSIS — J4540 Moderate persistent asthma, uncomplicated: Secondary | ICD-10-CM | POA: Diagnosis not present

## 2021-09-03 DIAGNOSIS — G4733 Obstructive sleep apnea (adult) (pediatric): Secondary | ICD-10-CM | POA: Diagnosis not present

## 2021-09-09 ENCOUNTER — Other Ambulatory Visit: Payer: Self-pay | Admitting: Cardiology

## 2021-09-12 DIAGNOSIS — I129 Hypertensive chronic kidney disease with stage 1 through stage 4 chronic kidney disease, or unspecified chronic kidney disease: Secondary | ICD-10-CM | POA: Diagnosis not present

## 2021-09-12 DIAGNOSIS — N183 Chronic kidney disease, stage 3 unspecified: Secondary | ICD-10-CM | POA: Diagnosis not present

## 2021-09-12 DIAGNOSIS — E785 Hyperlipidemia, unspecified: Secondary | ICD-10-CM | POA: Diagnosis not present

## 2021-09-12 DIAGNOSIS — M109 Gout, unspecified: Secondary | ICD-10-CM | POA: Diagnosis not present

## 2021-09-12 DIAGNOSIS — R7303 Prediabetes: Secondary | ICD-10-CM | POA: Diagnosis not present

## 2021-09-12 DIAGNOSIS — G309 Alzheimer's disease, unspecified: Secondary | ICD-10-CM | POA: Diagnosis not present

## 2021-09-12 DIAGNOSIS — G4762 Sleep related leg cramps: Secondary | ICD-10-CM | POA: Diagnosis not present

## 2021-09-16 DIAGNOSIS — D531 Other megaloblastic anemias, not elsewhere classified: Secondary | ICD-10-CM | POA: Diagnosis not present

## 2021-09-16 DIAGNOSIS — D649 Anemia, unspecified: Secondary | ICD-10-CM | POA: Diagnosis not present

## 2021-09-18 DIAGNOSIS — J4540 Moderate persistent asthma, uncomplicated: Secondary | ICD-10-CM | POA: Diagnosis not present

## 2021-09-18 DIAGNOSIS — G2581 Restless legs syndrome: Secondary | ICD-10-CM | POA: Diagnosis not present

## 2021-09-18 DIAGNOSIS — J301 Allergic rhinitis due to pollen: Secondary | ICD-10-CM | POA: Diagnosis not present

## 2021-09-18 DIAGNOSIS — G4733 Obstructive sleep apnea (adult) (pediatric): Secondary | ICD-10-CM | POA: Diagnosis not present

## 2021-10-02 DIAGNOSIS — H938X2 Other specified disorders of left ear: Secondary | ICD-10-CM | POA: Diagnosis not present

## 2021-10-07 ENCOUNTER — Other Ambulatory Visit: Payer: Self-pay | Admitting: Cardiology

## 2021-10-07 DIAGNOSIS — G4733 Obstructive sleep apnea (adult) (pediatric): Secondary | ICD-10-CM | POA: Diagnosis not present

## 2021-10-07 DIAGNOSIS — R059 Cough, unspecified: Secondary | ICD-10-CM | POA: Diagnosis not present

## 2021-10-07 DIAGNOSIS — G2581 Restless legs syndrome: Secondary | ICD-10-CM | POA: Diagnosis not present

## 2021-10-07 DIAGNOSIS — J4540 Moderate persistent asthma, uncomplicated: Secondary | ICD-10-CM | POA: Diagnosis not present

## 2021-10-07 DIAGNOSIS — J301 Allergic rhinitis due to pollen: Secondary | ICD-10-CM | POA: Diagnosis not present

## 2021-10-09 DIAGNOSIS — H905 Unspecified sensorineural hearing loss: Secondary | ICD-10-CM | POA: Diagnosis not present

## 2021-10-29 DIAGNOSIS — G2581 Restless legs syndrome: Secondary | ICD-10-CM | POA: Diagnosis not present

## 2021-10-29 DIAGNOSIS — G4733 Obstructive sleep apnea (adult) (pediatric): Secondary | ICD-10-CM | POA: Diagnosis not present

## 2021-10-29 DIAGNOSIS — R059 Cough, unspecified: Secondary | ICD-10-CM | POA: Diagnosis not present

## 2021-10-29 DIAGNOSIS — J4540 Moderate persistent asthma, uncomplicated: Secondary | ICD-10-CM | POA: Diagnosis not present

## 2021-10-29 DIAGNOSIS — J301 Allergic rhinitis due to pollen: Secondary | ICD-10-CM | POA: Diagnosis not present

## 2021-10-30 DIAGNOSIS — M79641 Pain in right hand: Secondary | ICD-10-CM | POA: Diagnosis not present

## 2021-10-30 DIAGNOSIS — M79642 Pain in left hand: Secondary | ICD-10-CM | POA: Diagnosis not present

## 2021-10-30 DIAGNOSIS — M47816 Spondylosis without myelopathy or radiculopathy, lumbar region: Secondary | ICD-10-CM | POA: Diagnosis not present

## 2021-10-31 DIAGNOSIS — M47816 Spondylosis without myelopathy or radiculopathy, lumbar region: Secondary | ICD-10-CM | POA: Diagnosis not present

## 2021-10-31 DIAGNOSIS — G2581 Restless legs syndrome: Secondary | ICD-10-CM | POA: Diagnosis not present

## 2021-10-31 DIAGNOSIS — G5601 Carpal tunnel syndrome, right upper limb: Secondary | ICD-10-CM | POA: Diagnosis not present

## 2021-11-06 DIAGNOSIS — G4733 Obstructive sleep apnea (adult) (pediatric): Secondary | ICD-10-CM | POA: Diagnosis not present

## 2021-11-13 DIAGNOSIS — J4540 Moderate persistent asthma, uncomplicated: Secondary | ICD-10-CM | POA: Diagnosis not present

## 2021-11-13 DIAGNOSIS — G2581 Restless legs syndrome: Secondary | ICD-10-CM | POA: Diagnosis not present

## 2021-11-13 DIAGNOSIS — J301 Allergic rhinitis due to pollen: Secondary | ICD-10-CM | POA: Diagnosis not present

## 2021-11-13 DIAGNOSIS — G4733 Obstructive sleep apnea (adult) (pediatric): Secondary | ICD-10-CM | POA: Diagnosis not present

## 2021-11-13 DIAGNOSIS — R059 Cough, unspecified: Secondary | ICD-10-CM | POA: Diagnosis not present

## 2021-12-04 DIAGNOSIS — G4733 Obstructive sleep apnea (adult) (pediatric): Secondary | ICD-10-CM | POA: Diagnosis not present

## 2021-12-12 DIAGNOSIS — M19011 Primary osteoarthritis, right shoulder: Secondary | ICD-10-CM | POA: Diagnosis not present

## 2021-12-12 DIAGNOSIS — M25511 Pain in right shoulder: Secondary | ICD-10-CM | POA: Diagnosis not present

## 2021-12-12 DIAGNOSIS — M419 Scoliosis, unspecified: Secondary | ICD-10-CM | POA: Diagnosis not present

## 2021-12-12 DIAGNOSIS — M4316 Spondylolisthesis, lumbar region: Secondary | ICD-10-CM | POA: Diagnosis not present

## 2021-12-12 DIAGNOSIS — M545 Low back pain, unspecified: Secondary | ICD-10-CM | POA: Diagnosis not present

## 2021-12-12 DIAGNOSIS — M47816 Spondylosis without myelopathy or radiculopathy, lumbar region: Secondary | ICD-10-CM | POA: Diagnosis not present

## 2021-12-18 DIAGNOSIS — G4733 Obstructive sleep apnea (adult) (pediatric): Secondary | ICD-10-CM | POA: Diagnosis not present

## 2021-12-18 DIAGNOSIS — R059 Cough, unspecified: Secondary | ICD-10-CM | POA: Diagnosis not present

## 2021-12-18 DIAGNOSIS — G2581 Restless legs syndrome: Secondary | ICD-10-CM | POA: Diagnosis not present

## 2021-12-18 DIAGNOSIS — J301 Allergic rhinitis due to pollen: Secondary | ICD-10-CM | POA: Diagnosis not present

## 2021-12-18 DIAGNOSIS — J4540 Moderate persistent asthma, uncomplicated: Secondary | ICD-10-CM | POA: Diagnosis not present

## 2021-12-19 DIAGNOSIS — I129 Hypertensive chronic kidney disease with stage 1 through stage 4 chronic kidney disease, or unspecified chronic kidney disease: Secondary | ICD-10-CM | POA: Diagnosis not present

## 2021-12-19 DIAGNOSIS — G4762 Sleep related leg cramps: Secondary | ICD-10-CM | POA: Diagnosis not present

## 2021-12-19 DIAGNOSIS — M109 Gout, unspecified: Secondary | ICD-10-CM | POA: Diagnosis not present

## 2021-12-19 DIAGNOSIS — N183 Chronic kidney disease, stage 3 unspecified: Secondary | ICD-10-CM | POA: Diagnosis not present

## 2021-12-19 DIAGNOSIS — D638 Anemia in other chronic diseases classified elsewhere: Secondary | ICD-10-CM | POA: Diagnosis not present

## 2021-12-19 DIAGNOSIS — R7303 Prediabetes: Secondary | ICD-10-CM | POA: Diagnosis not present

## 2021-12-19 DIAGNOSIS — G309 Alzheimer's disease, unspecified: Secondary | ICD-10-CM | POA: Diagnosis not present

## 2021-12-19 DIAGNOSIS — E785 Hyperlipidemia, unspecified: Secondary | ICD-10-CM | POA: Diagnosis not present

## 2021-12-19 DIAGNOSIS — N189 Chronic kidney disease, unspecified: Secondary | ICD-10-CM | POA: Diagnosis not present

## 2021-12-23 DIAGNOSIS — M898X9 Other specified disorders of bone, unspecified site: Secondary | ICD-10-CM | POA: Diagnosis not present

## 2021-12-23 DIAGNOSIS — N183 Chronic kidney disease, stage 3 unspecified: Secondary | ICD-10-CM | POA: Diagnosis not present

## 2021-12-23 DIAGNOSIS — N179 Acute kidney failure, unspecified: Secondary | ICD-10-CM | POA: Diagnosis not present

## 2021-12-23 DIAGNOSIS — D631 Anemia in chronic kidney disease: Secondary | ICD-10-CM | POA: Diagnosis not present

## 2021-12-23 DIAGNOSIS — E559 Vitamin D deficiency, unspecified: Secondary | ICD-10-CM | POA: Diagnosis not present

## 2021-12-23 DIAGNOSIS — I129 Hypertensive chronic kidney disease with stage 1 through stage 4 chronic kidney disease, or unspecified chronic kidney disease: Secondary | ICD-10-CM | POA: Diagnosis not present

## 2021-12-23 DIAGNOSIS — E876 Hypokalemia: Secondary | ICD-10-CM | POA: Diagnosis not present

## 2021-12-23 DIAGNOSIS — N1832 Chronic kidney disease, stage 3b: Secondary | ICD-10-CM | POA: Diagnosis not present

## 2021-12-30 DIAGNOSIS — M19011 Primary osteoarthritis, right shoulder: Secondary | ICD-10-CM | POA: Diagnosis not present

## 2021-12-30 DIAGNOSIS — M47812 Spondylosis without myelopathy or radiculopathy, cervical region: Secondary | ICD-10-CM | POA: Diagnosis not present

## 2021-12-30 DIAGNOSIS — M5090 Cervical disc disorder, unspecified, unspecified cervical region: Secondary | ICD-10-CM | POA: Diagnosis not present

## 2022-01-05 DIAGNOSIS — M5030 Other cervical disc degeneration, unspecified cervical region: Secondary | ICD-10-CM | POA: Diagnosis not present

## 2022-01-05 DIAGNOSIS — M4312 Spondylolisthesis, cervical region: Secondary | ICD-10-CM | POA: Diagnosis not present

## 2022-01-05 DIAGNOSIS — M79641 Pain in right hand: Secondary | ICD-10-CM | POA: Diagnosis not present

## 2022-01-05 DIAGNOSIS — M47812 Spondylosis without myelopathy or radiculopathy, cervical region: Secondary | ICD-10-CM | POA: Diagnosis not present

## 2022-01-05 DIAGNOSIS — M4802 Spinal stenosis, cervical region: Secondary | ICD-10-CM | POA: Diagnosis not present

## 2022-01-05 DIAGNOSIS — M79642 Pain in left hand: Secondary | ICD-10-CM | POA: Diagnosis not present

## 2022-01-05 DIAGNOSIS — M4803 Spinal stenosis, cervicothoracic region: Secondary | ICD-10-CM | POA: Diagnosis not present

## 2022-01-06 DIAGNOSIS — N183 Chronic kidney disease, stage 3 unspecified: Secondary | ICD-10-CM | POA: Diagnosis not present

## 2022-01-06 DIAGNOSIS — I129 Hypertensive chronic kidney disease with stage 1 through stage 4 chronic kidney disease, or unspecified chronic kidney disease: Secondary | ICD-10-CM | POA: Diagnosis not present

## 2022-01-08 DIAGNOSIS — M47812 Spondylosis without myelopathy or radiculopathy, cervical region: Secondary | ICD-10-CM | POA: Diagnosis not present

## 2022-01-09 DIAGNOSIS — M5012 Mid-cervical disc disorder, unspecified level: Secondary | ICD-10-CM | POA: Diagnosis not present

## 2022-01-09 DIAGNOSIS — M50122 Cervical disc disorder at C5-C6 level with radiculopathy: Secondary | ICD-10-CM | POA: Diagnosis not present

## 2022-01-09 DIAGNOSIS — M50121 Cervical disc disorder at C4-C5 level with radiculopathy: Secondary | ICD-10-CM | POA: Diagnosis not present

## 2022-01-09 DIAGNOSIS — M47812 Spondylosis without myelopathy or radiculopathy, cervical region: Secondary | ICD-10-CM | POA: Diagnosis not present

## 2022-01-09 DIAGNOSIS — M5412 Radiculopathy, cervical region: Secondary | ICD-10-CM | POA: Diagnosis not present

## 2022-01-14 DIAGNOSIS — G252 Other specified forms of tremor: Secondary | ICD-10-CM | POA: Diagnosis not present

## 2022-01-14 DIAGNOSIS — R079 Chest pain, unspecified: Secondary | ICD-10-CM | POA: Diagnosis not present

## 2022-01-14 DIAGNOSIS — F01A0 Vascular dementia, mild, without behavioral disturbance, psychotic disturbance, mood disturbance, and anxiety: Secondary | ICD-10-CM | POA: Diagnosis not present

## 2022-01-14 DIAGNOSIS — G2581 Restless legs syndrome: Secondary | ICD-10-CM | POA: Diagnosis not present

## 2022-01-14 DIAGNOSIS — M47816 Spondylosis without myelopathy or radiculopathy, lumbar region: Secondary | ICD-10-CM | POA: Diagnosis not present

## 2022-01-20 DIAGNOSIS — H25812 Combined forms of age-related cataract, left eye: Secondary | ICD-10-CM | POA: Diagnosis not present

## 2022-01-20 DIAGNOSIS — Z01818 Encounter for other preprocedural examination: Secondary | ICD-10-CM | POA: Diagnosis not present

## 2022-01-20 DIAGNOSIS — H25813 Combined forms of age-related cataract, bilateral: Secondary | ICD-10-CM | POA: Diagnosis not present

## 2022-01-27 DIAGNOSIS — H2513 Age-related nuclear cataract, bilateral: Secondary | ICD-10-CM | POA: Diagnosis not present

## 2022-01-27 DIAGNOSIS — H52223 Regular astigmatism, bilateral: Secondary | ICD-10-CM | POA: Diagnosis not present

## 2022-01-27 DIAGNOSIS — H25812 Combined forms of age-related cataract, left eye: Secondary | ICD-10-CM | POA: Diagnosis not present

## 2022-01-27 DIAGNOSIS — H259 Unspecified age-related cataract: Secondary | ICD-10-CM | POA: Diagnosis not present

## 2022-01-27 DIAGNOSIS — I10 Essential (primary) hypertension: Secondary | ICD-10-CM | POA: Diagnosis not present

## 2022-01-29 DIAGNOSIS — M47816 Spondylosis without myelopathy or radiculopathy, lumbar region: Secondary | ICD-10-CM | POA: Diagnosis not present

## 2022-02-17 DIAGNOSIS — G4733 Obstructive sleep apnea (adult) (pediatric): Secondary | ICD-10-CM | POA: Diagnosis not present

## 2022-02-18 DIAGNOSIS — M8589 Other specified disorders of bone density and structure, multiple sites: Secondary | ICD-10-CM | POA: Diagnosis not present

## 2022-02-18 DIAGNOSIS — Z1231 Encounter for screening mammogram for malignant neoplasm of breast: Secondary | ICD-10-CM | POA: Diagnosis not present

## 2022-02-19 DIAGNOSIS — G2581 Restless legs syndrome: Secondary | ICD-10-CM | POA: Diagnosis not present

## 2022-02-19 DIAGNOSIS — G4733 Obstructive sleep apnea (adult) (pediatric): Secondary | ICD-10-CM | POA: Diagnosis not present

## 2022-02-19 DIAGNOSIS — R252 Cramp and spasm: Secondary | ICD-10-CM | POA: Diagnosis not present

## 2022-02-19 DIAGNOSIS — H6121 Impacted cerumen, right ear: Secondary | ICD-10-CM | POA: Diagnosis not present

## 2022-02-19 DIAGNOSIS — H60391 Other infective otitis externa, right ear: Secondary | ICD-10-CM | POA: Diagnosis not present

## 2022-02-19 DIAGNOSIS — J4540 Moderate persistent asthma, uncomplicated: Secondary | ICD-10-CM | POA: Diagnosis not present

## 2022-02-19 DIAGNOSIS — J019 Acute sinusitis, unspecified: Secondary | ICD-10-CM | POA: Diagnosis not present

## 2022-02-19 DIAGNOSIS — J301 Allergic rhinitis due to pollen: Secondary | ICD-10-CM | POA: Diagnosis not present

## 2022-02-24 DIAGNOSIS — R252 Cramp and spasm: Secondary | ICD-10-CM | POA: Diagnosis not present

## 2022-02-24 DIAGNOSIS — R0989 Other specified symptoms and signs involving the circulatory and respiratory systems: Secondary | ICD-10-CM | POA: Diagnosis not present

## 2022-02-24 DIAGNOSIS — I70203 Unspecified atherosclerosis of native arteries of extremities, bilateral legs: Secondary | ICD-10-CM | POA: Diagnosis not present

## 2022-03-03 DIAGNOSIS — H5213 Myopia, bilateral: Secondary | ICD-10-CM | POA: Diagnosis not present

## 2022-03-03 DIAGNOSIS — Z79899 Other long term (current) drug therapy: Secondary | ICD-10-CM | POA: Diagnosis not present

## 2022-03-03 DIAGNOSIS — I10 Essential (primary) hypertension: Secondary | ICD-10-CM | POA: Diagnosis not present

## 2022-03-03 DIAGNOSIS — H25811 Combined forms of age-related cataract, right eye: Secondary | ICD-10-CM | POA: Diagnosis not present

## 2022-03-03 DIAGNOSIS — H52223 Regular astigmatism, bilateral: Secondary | ICD-10-CM | POA: Diagnosis not present

## 2022-03-03 DIAGNOSIS — H259 Unspecified age-related cataract: Secondary | ICD-10-CM | POA: Diagnosis not present

## 2022-03-12 DIAGNOSIS — G4733 Obstructive sleep apnea (adult) (pediatric): Secondary | ICD-10-CM | POA: Diagnosis not present

## 2022-03-17 DIAGNOSIS — M792 Neuralgia and neuritis, unspecified: Secondary | ICD-10-CM | POA: Diagnosis not present

## 2022-03-17 DIAGNOSIS — M542 Cervicalgia: Secondary | ICD-10-CM | POA: Diagnosis not present

## 2022-03-17 DIAGNOSIS — M47812 Spondylosis without myelopathy or radiculopathy, cervical region: Secondary | ICD-10-CM | POA: Diagnosis not present

## 2022-03-19 DIAGNOSIS — M79603 Pain in arm, unspecified: Secondary | ICD-10-CM | POA: Diagnosis not present

## 2022-03-19 DIAGNOSIS — E7800 Pure hypercholesterolemia, unspecified: Secondary | ICD-10-CM | POA: Diagnosis not present

## 2022-03-19 DIAGNOSIS — G4733 Obstructive sleep apnea (adult) (pediatric): Secondary | ICD-10-CM | POA: Diagnosis not present

## 2022-03-19 DIAGNOSIS — I10 Essential (primary) hypertension: Secondary | ICD-10-CM | POA: Diagnosis not present

## 2022-03-19 DIAGNOSIS — M542 Cervicalgia: Secondary | ICD-10-CM | POA: Diagnosis not present

## 2022-03-19 DIAGNOSIS — Z743 Need for continuous supervision: Secondary | ICD-10-CM | POA: Diagnosis not present

## 2022-03-20 DIAGNOSIS — R7303 Prediabetes: Secondary | ICD-10-CM | POA: Diagnosis not present

## 2022-03-20 DIAGNOSIS — M792 Neuralgia and neuritis, unspecified: Secondary | ICD-10-CM | POA: Diagnosis not present

## 2022-03-20 DIAGNOSIS — I129 Hypertensive chronic kidney disease with stage 1 through stage 4 chronic kidney disease, or unspecified chronic kidney disease: Secondary | ICD-10-CM | POA: Diagnosis not present

## 2022-03-20 DIAGNOSIS — N184 Chronic kidney disease, stage 4 (severe): Secondary | ICD-10-CM | POA: Diagnosis not present

## 2022-03-20 DIAGNOSIS — D638 Anemia in other chronic diseases classified elsewhere: Secondary | ICD-10-CM | POA: Diagnosis not present

## 2022-03-20 DIAGNOSIS — M109 Gout, unspecified: Secondary | ICD-10-CM | POA: Diagnosis not present

## 2022-03-20 DIAGNOSIS — G4762 Sleep related leg cramps: Secondary | ICD-10-CM | POA: Diagnosis not present

## 2022-03-20 DIAGNOSIS — E785 Hyperlipidemia, unspecified: Secondary | ICD-10-CM | POA: Diagnosis not present

## 2022-03-23 DIAGNOSIS — R600 Localized edema: Secondary | ICD-10-CM | POA: Diagnosis not present

## 2022-03-23 DIAGNOSIS — D509 Iron deficiency anemia, unspecified: Secondary | ICD-10-CM | POA: Diagnosis not present

## 2022-03-23 DIAGNOSIS — E785 Hyperlipidemia, unspecified: Secondary | ICD-10-CM | POA: Diagnosis not present

## 2022-03-23 DIAGNOSIS — E1169 Type 2 diabetes mellitus with other specified complication: Secondary | ICD-10-CM | POA: Diagnosis not present

## 2022-03-27 DIAGNOSIS — M19011 Primary osteoarthritis, right shoulder: Secondary | ICD-10-CM | POA: Diagnosis not present

## 2022-03-27 DIAGNOSIS — S0990XA Unspecified injury of head, initial encounter: Secondary | ICD-10-CM | POA: Diagnosis not present

## 2022-03-27 DIAGNOSIS — R55 Syncope and collapse: Secondary | ICD-10-CM | POA: Diagnosis not present

## 2022-03-27 DIAGNOSIS — I951 Orthostatic hypotension: Secondary | ICD-10-CM | POA: Diagnosis not present

## 2022-03-27 DIAGNOSIS — M26609 Unspecified temporomandibular joint disorder, unspecified side: Secondary | ICD-10-CM | POA: Diagnosis not present

## 2022-03-27 DIAGNOSIS — I1310 Hypertensive heart and chronic kidney disease without heart failure, with stage 1 through stage 4 chronic kidney disease, or unspecified chronic kidney disease: Secondary | ICD-10-CM | POA: Diagnosis not present

## 2022-03-27 DIAGNOSIS — Z743 Need for continuous supervision: Secondary | ICD-10-CM | POA: Diagnosis not present

## 2022-03-27 DIAGNOSIS — R197 Diarrhea, unspecified: Secondary | ICD-10-CM | POA: Diagnosis not present

## 2022-03-27 DIAGNOSIS — Z79899 Other long term (current) drug therapy: Secondary | ICD-10-CM | POA: Diagnosis not present

## 2022-03-27 DIAGNOSIS — I441 Atrioventricular block, second degree: Secondary | ICD-10-CM | POA: Diagnosis not present

## 2022-03-27 DIAGNOSIS — E1122 Type 2 diabetes mellitus with diabetic chronic kidney disease: Secondary | ICD-10-CM | POA: Diagnosis not present

## 2022-03-27 DIAGNOSIS — R42 Dizziness and giddiness: Secondary | ICD-10-CM | POA: Diagnosis not present

## 2022-03-27 DIAGNOSIS — E7800 Pure hypercholesterolemia, unspecified: Secondary | ICD-10-CM | POA: Diagnosis not present

## 2022-03-27 DIAGNOSIS — M47812 Spondylosis without myelopathy or radiculopathy, cervical region: Secondary | ICD-10-CM | POA: Diagnosis not present

## 2022-03-27 DIAGNOSIS — G629 Polyneuropathy, unspecified: Secondary | ICD-10-CM | POA: Diagnosis not present

## 2022-03-27 DIAGNOSIS — Z66 Do not resuscitate: Secondary | ICD-10-CM | POA: Diagnosis not present

## 2022-03-27 DIAGNOSIS — R6889 Other general symptoms and signs: Secondary | ICD-10-CM | POA: Diagnosis not present

## 2022-03-27 DIAGNOSIS — N189 Chronic kidney disease, unspecified: Secondary | ICD-10-CM | POA: Diagnosis not present

## 2022-03-27 DIAGNOSIS — R9431 Abnormal electrocardiogram [ECG] [EKG]: Secondary | ICD-10-CM | POA: Diagnosis not present

## 2022-03-27 DIAGNOSIS — M109 Gout, unspecified: Secondary | ICD-10-CM | POA: Diagnosis not present

## 2022-03-27 DIAGNOSIS — E876 Hypokalemia: Secondary | ICD-10-CM | POA: Diagnosis not present

## 2022-03-27 DIAGNOSIS — K219 Gastro-esophageal reflux disease without esophagitis: Secondary | ICD-10-CM | POA: Diagnosis not present

## 2022-03-27 DIAGNOSIS — N183 Chronic kidney disease, stage 3 unspecified: Secondary | ICD-10-CM | POA: Diagnosis not present

## 2022-03-27 DIAGNOSIS — R404 Transient alteration of awareness: Secondary | ICD-10-CM | POA: Diagnosis not present

## 2022-03-27 DIAGNOSIS — Z043 Encounter for examination and observation following other accident: Secondary | ICD-10-CM | POA: Diagnosis not present

## 2022-03-27 DIAGNOSIS — R531 Weakness: Secondary | ICD-10-CM | POA: Diagnosis not present

## 2022-03-27 DIAGNOSIS — E860 Dehydration: Secondary | ICD-10-CM | POA: Diagnosis not present

## 2022-03-27 DIAGNOSIS — N179 Acute kidney failure, unspecified: Secondary | ICD-10-CM | POA: Diagnosis not present

## 2022-03-27 DIAGNOSIS — M1990 Unspecified osteoarthritis, unspecified site: Secondary | ICD-10-CM | POA: Diagnosis not present

## 2022-03-28 DIAGNOSIS — I951 Orthostatic hypotension: Secondary | ICD-10-CM | POA: Diagnosis not present

## 2022-03-28 DIAGNOSIS — N179 Acute kidney failure, unspecified: Secondary | ICD-10-CM | POA: Diagnosis not present

## 2022-03-28 DIAGNOSIS — N189 Chronic kidney disease, unspecified: Secondary | ICD-10-CM | POA: Diagnosis not present

## 2022-03-28 DIAGNOSIS — I6523 Occlusion and stenosis of bilateral carotid arteries: Secondary | ICD-10-CM | POA: Diagnosis not present

## 2022-03-28 DIAGNOSIS — R55 Syncope and collapse: Secondary | ICD-10-CM | POA: Diagnosis not present

## 2022-03-29 DIAGNOSIS — I951 Orthostatic hypotension: Secondary | ICD-10-CM | POA: Diagnosis not present

## 2022-03-29 DIAGNOSIS — N179 Acute kidney failure, unspecified: Secondary | ICD-10-CM | POA: Diagnosis not present

## 2022-03-29 DIAGNOSIS — N189 Chronic kidney disease, unspecified: Secondary | ICD-10-CM | POA: Diagnosis not present

## 2022-03-30 DIAGNOSIS — I361 Nonrheumatic tricuspid (valve) insufficiency: Secondary | ICD-10-CM | POA: Diagnosis not present

## 2022-03-31 DIAGNOSIS — I129 Hypertensive chronic kidney disease with stage 1 through stage 4 chronic kidney disease, or unspecified chronic kidney disease: Secondary | ICD-10-CM | POA: Diagnosis not present

## 2022-03-31 DIAGNOSIS — D631 Anemia in chronic kidney disease: Secondary | ICD-10-CM | POA: Diagnosis not present

## 2022-03-31 DIAGNOSIS — E1169 Type 2 diabetes mellitus with other specified complication: Secondary | ICD-10-CM | POA: Diagnosis not present

## 2022-03-31 DIAGNOSIS — G4700 Insomnia, unspecified: Secondary | ICD-10-CM | POA: Diagnosis not present

## 2022-03-31 DIAGNOSIS — D509 Iron deficiency anemia, unspecified: Secondary | ICD-10-CM | POA: Diagnosis not present

## 2022-03-31 DIAGNOSIS — E876 Hypokalemia: Secondary | ICD-10-CM | POA: Diagnosis not present

## 2022-03-31 DIAGNOSIS — F32A Depression, unspecified: Secondary | ICD-10-CM | POA: Diagnosis not present

## 2022-03-31 DIAGNOSIS — N179 Acute kidney failure, unspecified: Secondary | ICD-10-CM | POA: Diagnosis not present

## 2022-03-31 DIAGNOSIS — K219 Gastro-esophageal reflux disease without esophagitis: Secondary | ICD-10-CM | POA: Diagnosis not present

## 2022-03-31 DIAGNOSIS — I951 Orthostatic hypotension: Secondary | ICD-10-CM | POA: Diagnosis not present

## 2022-03-31 DIAGNOSIS — R197 Diarrhea, unspecified: Secondary | ICD-10-CM | POA: Diagnosis not present

## 2022-03-31 DIAGNOSIS — R55 Syncope and collapse: Secondary | ICD-10-CM | POA: Diagnosis not present

## 2022-03-31 DIAGNOSIS — M109 Gout, unspecified: Secondary | ICD-10-CM | POA: Diagnosis not present

## 2022-03-31 DIAGNOSIS — N3281 Overactive bladder: Secondary | ICD-10-CM | POA: Diagnosis not present

## 2022-03-31 DIAGNOSIS — N183 Chronic kidney disease, stage 3 unspecified: Secondary | ICD-10-CM | POA: Diagnosis not present

## 2022-03-31 DIAGNOSIS — G8929 Other chronic pain: Secondary | ICD-10-CM | POA: Diagnosis not present

## 2022-03-31 DIAGNOSIS — E559 Vitamin D deficiency, unspecified: Secondary | ICD-10-CM | POA: Diagnosis not present

## 2022-03-31 DIAGNOSIS — G2581 Restless legs syndrome: Secondary | ICD-10-CM | POA: Diagnosis not present

## 2022-03-31 DIAGNOSIS — G4762 Sleep related leg cramps: Secondary | ICD-10-CM | POA: Diagnosis not present

## 2022-03-31 DIAGNOSIS — J45909 Unspecified asthma, uncomplicated: Secondary | ICD-10-CM | POA: Diagnosis not present

## 2022-03-31 DIAGNOSIS — N184 Chronic kidney disease, stage 4 (severe): Secondary | ICD-10-CM | POA: Diagnosis not present

## 2022-03-31 DIAGNOSIS — E7800 Pure hypercholesterolemia, unspecified: Secondary | ICD-10-CM | POA: Diagnosis not present

## 2022-03-31 DIAGNOSIS — G250 Essential tremor: Secondary | ICD-10-CM | POA: Diagnosis not present

## 2022-03-31 DIAGNOSIS — M549 Dorsalgia, unspecified: Secondary | ICD-10-CM | POA: Diagnosis not present

## 2022-03-31 DIAGNOSIS — E1122 Type 2 diabetes mellitus with diabetic chronic kidney disease: Secondary | ICD-10-CM | POA: Diagnosis not present

## 2022-03-31 DIAGNOSIS — G309 Alzheimer's disease, unspecified: Secondary | ICD-10-CM | POA: Diagnosis not present

## 2022-03-31 DIAGNOSIS — E1140 Type 2 diabetes mellitus with diabetic neuropathy, unspecified: Secondary | ICD-10-CM | POA: Diagnosis not present

## 2022-03-31 DIAGNOSIS — I1310 Hypertensive heart and chronic kidney disease without heart failure, with stage 1 through stage 4 chronic kidney disease, or unspecified chronic kidney disease: Secondary | ICD-10-CM | POA: Diagnosis not present

## 2022-04-02 DIAGNOSIS — E1122 Type 2 diabetes mellitus with diabetic chronic kidney disease: Secondary | ICD-10-CM | POA: Diagnosis not present

## 2022-04-02 DIAGNOSIS — G8929 Other chronic pain: Secondary | ICD-10-CM | POA: Diagnosis not present

## 2022-04-02 DIAGNOSIS — M109 Gout, unspecified: Secondary | ICD-10-CM | POA: Diagnosis not present

## 2022-04-02 DIAGNOSIS — G309 Alzheimer's disease, unspecified: Secondary | ICD-10-CM | POA: Diagnosis not present

## 2022-04-02 DIAGNOSIS — M549 Dorsalgia, unspecified: Secondary | ICD-10-CM | POA: Diagnosis not present

## 2022-04-02 DIAGNOSIS — G4762 Sleep related leg cramps: Secondary | ICD-10-CM | POA: Diagnosis not present

## 2022-04-02 DIAGNOSIS — G250 Essential tremor: Secondary | ICD-10-CM | POA: Diagnosis not present

## 2022-04-02 DIAGNOSIS — J45909 Unspecified asthma, uncomplicated: Secondary | ICD-10-CM | POA: Diagnosis not present

## 2022-04-02 DIAGNOSIS — E559 Vitamin D deficiency, unspecified: Secondary | ICD-10-CM | POA: Diagnosis not present

## 2022-04-02 DIAGNOSIS — J301 Allergic rhinitis due to pollen: Secondary | ICD-10-CM | POA: Diagnosis not present

## 2022-04-02 DIAGNOSIS — G4700 Insomnia, unspecified: Secondary | ICD-10-CM | POA: Diagnosis not present

## 2022-04-02 DIAGNOSIS — E7800 Pure hypercholesterolemia, unspecified: Secondary | ICD-10-CM | POA: Diagnosis not present

## 2022-04-02 DIAGNOSIS — G4733 Obstructive sleep apnea (adult) (pediatric): Secondary | ICD-10-CM | POA: Diagnosis not present

## 2022-04-02 DIAGNOSIS — E1140 Type 2 diabetes mellitus with diabetic neuropathy, unspecified: Secondary | ICD-10-CM | POA: Diagnosis not present

## 2022-04-02 DIAGNOSIS — D631 Anemia in chronic kidney disease: Secondary | ICD-10-CM | POA: Diagnosis not present

## 2022-04-02 DIAGNOSIS — G2581 Restless legs syndrome: Secondary | ICD-10-CM | POA: Diagnosis not present

## 2022-04-02 DIAGNOSIS — K219 Gastro-esophageal reflux disease without esophagitis: Secondary | ICD-10-CM | POA: Diagnosis not present

## 2022-04-02 DIAGNOSIS — F32A Depression, unspecified: Secondary | ICD-10-CM | POA: Diagnosis not present

## 2022-04-02 DIAGNOSIS — N179 Acute kidney failure, unspecified: Secondary | ICD-10-CM | POA: Diagnosis not present

## 2022-04-02 DIAGNOSIS — D509 Iron deficiency anemia, unspecified: Secondary | ICD-10-CM | POA: Diagnosis not present

## 2022-04-02 DIAGNOSIS — I951 Orthostatic hypotension: Secondary | ICD-10-CM | POA: Diagnosis not present

## 2022-04-02 DIAGNOSIS — E1169 Type 2 diabetes mellitus with other specified complication: Secondary | ICD-10-CM | POA: Diagnosis not present

## 2022-04-02 DIAGNOSIS — N3281 Overactive bladder: Secondary | ICD-10-CM | POA: Diagnosis not present

## 2022-04-02 DIAGNOSIS — I1310 Hypertensive heart and chronic kidney disease without heart failure, with stage 1 through stage 4 chronic kidney disease, or unspecified chronic kidney disease: Secondary | ICD-10-CM | POA: Diagnosis not present

## 2022-04-02 DIAGNOSIS — J4540 Moderate persistent asthma, uncomplicated: Secondary | ICD-10-CM | POA: Diagnosis not present

## 2022-04-02 DIAGNOSIS — N184 Chronic kidney disease, stage 4 (severe): Secondary | ICD-10-CM | POA: Diagnosis not present

## 2022-04-03 DIAGNOSIS — E1140 Type 2 diabetes mellitus with diabetic neuropathy, unspecified: Secondary | ICD-10-CM | POA: Diagnosis not present

## 2022-04-03 DIAGNOSIS — N179 Acute kidney failure, unspecified: Secondary | ICD-10-CM | POA: Diagnosis not present

## 2022-04-03 DIAGNOSIS — G309 Alzheimer's disease, unspecified: Secondary | ICD-10-CM | POA: Diagnosis not present

## 2022-04-03 DIAGNOSIS — G250 Essential tremor: Secondary | ICD-10-CM | POA: Diagnosis not present

## 2022-04-03 DIAGNOSIS — E1169 Type 2 diabetes mellitus with other specified complication: Secondary | ICD-10-CM | POA: Diagnosis not present

## 2022-04-03 DIAGNOSIS — G2581 Restless legs syndrome: Secondary | ICD-10-CM | POA: Diagnosis not present

## 2022-04-03 DIAGNOSIS — I1310 Hypertensive heart and chronic kidney disease without heart failure, with stage 1 through stage 4 chronic kidney disease, or unspecified chronic kidney disease: Secondary | ICD-10-CM | POA: Diagnosis not present

## 2022-04-03 DIAGNOSIS — G4762 Sleep related leg cramps: Secondary | ICD-10-CM | POA: Diagnosis not present

## 2022-04-03 DIAGNOSIS — D631 Anemia in chronic kidney disease: Secondary | ICD-10-CM | POA: Diagnosis not present

## 2022-04-03 DIAGNOSIS — K219 Gastro-esophageal reflux disease without esophagitis: Secondary | ICD-10-CM | POA: Diagnosis not present

## 2022-04-03 DIAGNOSIS — I951 Orthostatic hypotension: Secondary | ICD-10-CM | POA: Diagnosis not present

## 2022-04-03 DIAGNOSIS — N3281 Overactive bladder: Secondary | ICD-10-CM | POA: Diagnosis not present

## 2022-04-03 DIAGNOSIS — M109 Gout, unspecified: Secondary | ICD-10-CM | POA: Diagnosis not present

## 2022-04-03 DIAGNOSIS — F32A Depression, unspecified: Secondary | ICD-10-CM | POA: Diagnosis not present

## 2022-04-03 DIAGNOSIS — M549 Dorsalgia, unspecified: Secondary | ICD-10-CM | POA: Diagnosis not present

## 2022-04-03 DIAGNOSIS — E1122 Type 2 diabetes mellitus with diabetic chronic kidney disease: Secondary | ICD-10-CM | POA: Diagnosis not present

## 2022-04-03 DIAGNOSIS — D509 Iron deficiency anemia, unspecified: Secondary | ICD-10-CM | POA: Diagnosis not present

## 2022-04-03 DIAGNOSIS — E559 Vitamin D deficiency, unspecified: Secondary | ICD-10-CM | POA: Diagnosis not present

## 2022-04-03 DIAGNOSIS — E7800 Pure hypercholesterolemia, unspecified: Secondary | ICD-10-CM | POA: Diagnosis not present

## 2022-04-03 DIAGNOSIS — J45909 Unspecified asthma, uncomplicated: Secondary | ICD-10-CM | POA: Diagnosis not present

## 2022-04-03 DIAGNOSIS — G4700 Insomnia, unspecified: Secondary | ICD-10-CM | POA: Diagnosis not present

## 2022-04-03 DIAGNOSIS — G8929 Other chronic pain: Secondary | ICD-10-CM | POA: Diagnosis not present

## 2022-04-03 DIAGNOSIS — N184 Chronic kidney disease, stage 4 (severe): Secondary | ICD-10-CM | POA: Diagnosis not present

## 2022-04-06 DIAGNOSIS — G250 Essential tremor: Secondary | ICD-10-CM | POA: Diagnosis not present

## 2022-04-06 DIAGNOSIS — I1310 Hypertensive heart and chronic kidney disease without heart failure, with stage 1 through stage 4 chronic kidney disease, or unspecified chronic kidney disease: Secondary | ICD-10-CM | POA: Diagnosis not present

## 2022-04-06 DIAGNOSIS — G309 Alzheimer's disease, unspecified: Secondary | ICD-10-CM | POA: Diagnosis not present

## 2022-04-06 DIAGNOSIS — G4733 Obstructive sleep apnea (adult) (pediatric): Secondary | ICD-10-CM | POA: Diagnosis not present

## 2022-04-06 DIAGNOSIS — G8929 Other chronic pain: Secondary | ICD-10-CM | POA: Diagnosis not present

## 2022-04-06 DIAGNOSIS — I951 Orthostatic hypotension: Secondary | ICD-10-CM | POA: Diagnosis not present

## 2022-04-06 DIAGNOSIS — G4762 Sleep related leg cramps: Secondary | ICD-10-CM | POA: Diagnosis not present

## 2022-04-06 DIAGNOSIS — M549 Dorsalgia, unspecified: Secondary | ICD-10-CM | POA: Diagnosis not present

## 2022-04-06 DIAGNOSIS — J45909 Unspecified asthma, uncomplicated: Secondary | ICD-10-CM | POA: Diagnosis not present

## 2022-04-06 DIAGNOSIS — E1122 Type 2 diabetes mellitus with diabetic chronic kidney disease: Secondary | ICD-10-CM | POA: Diagnosis not present

## 2022-04-06 DIAGNOSIS — G4700 Insomnia, unspecified: Secondary | ICD-10-CM | POA: Diagnosis not present

## 2022-04-06 DIAGNOSIS — M109 Gout, unspecified: Secondary | ICD-10-CM | POA: Diagnosis not present

## 2022-04-06 DIAGNOSIS — D509 Iron deficiency anemia, unspecified: Secondary | ICD-10-CM | POA: Diagnosis not present

## 2022-04-06 DIAGNOSIS — N3281 Overactive bladder: Secondary | ICD-10-CM | POA: Diagnosis not present

## 2022-04-06 DIAGNOSIS — E7800 Pure hypercholesterolemia, unspecified: Secondary | ICD-10-CM | POA: Diagnosis not present

## 2022-04-06 DIAGNOSIS — D631 Anemia in chronic kidney disease: Secondary | ICD-10-CM | POA: Diagnosis not present

## 2022-04-06 DIAGNOSIS — F32A Depression, unspecified: Secondary | ICD-10-CM | POA: Diagnosis not present

## 2022-04-06 DIAGNOSIS — E1169 Type 2 diabetes mellitus with other specified complication: Secondary | ICD-10-CM | POA: Diagnosis not present

## 2022-04-06 DIAGNOSIS — G2581 Restless legs syndrome: Secondary | ICD-10-CM | POA: Diagnosis not present

## 2022-04-06 DIAGNOSIS — E1140 Type 2 diabetes mellitus with diabetic neuropathy, unspecified: Secondary | ICD-10-CM | POA: Diagnosis not present

## 2022-04-06 DIAGNOSIS — E559 Vitamin D deficiency, unspecified: Secondary | ICD-10-CM | POA: Diagnosis not present

## 2022-04-06 DIAGNOSIS — N184 Chronic kidney disease, stage 4 (severe): Secondary | ICD-10-CM | POA: Diagnosis not present

## 2022-04-06 DIAGNOSIS — K219 Gastro-esophageal reflux disease without esophagitis: Secondary | ICD-10-CM | POA: Diagnosis not present

## 2022-04-06 DIAGNOSIS — N179 Acute kidney failure, unspecified: Secondary | ICD-10-CM | POA: Diagnosis not present

## 2022-04-07 DIAGNOSIS — E1140 Type 2 diabetes mellitus with diabetic neuropathy, unspecified: Secondary | ICD-10-CM | POA: Diagnosis not present

## 2022-04-07 DIAGNOSIS — E1169 Type 2 diabetes mellitus with other specified complication: Secondary | ICD-10-CM | POA: Diagnosis not present

## 2022-04-07 DIAGNOSIS — D631 Anemia in chronic kidney disease: Secondary | ICD-10-CM | POA: Diagnosis not present

## 2022-04-07 DIAGNOSIS — N179 Acute kidney failure, unspecified: Secondary | ICD-10-CM | POA: Diagnosis not present

## 2022-04-07 DIAGNOSIS — I1310 Hypertensive heart and chronic kidney disease without heart failure, with stage 1 through stage 4 chronic kidney disease, or unspecified chronic kidney disease: Secondary | ICD-10-CM | POA: Diagnosis not present

## 2022-04-07 DIAGNOSIS — D509 Iron deficiency anemia, unspecified: Secondary | ICD-10-CM | POA: Diagnosis not present

## 2022-04-07 DIAGNOSIS — I951 Orthostatic hypotension: Secondary | ICD-10-CM | POA: Diagnosis not present

## 2022-04-07 DIAGNOSIS — J45909 Unspecified asthma, uncomplicated: Secondary | ICD-10-CM | POA: Diagnosis not present

## 2022-04-07 DIAGNOSIS — N184 Chronic kidney disease, stage 4 (severe): Secondary | ICD-10-CM | POA: Diagnosis not present

## 2022-04-07 DIAGNOSIS — N3281 Overactive bladder: Secondary | ICD-10-CM | POA: Diagnosis not present

## 2022-04-07 DIAGNOSIS — M549 Dorsalgia, unspecified: Secondary | ICD-10-CM | POA: Diagnosis not present

## 2022-04-07 DIAGNOSIS — E1122 Type 2 diabetes mellitus with diabetic chronic kidney disease: Secondary | ICD-10-CM | POA: Diagnosis not present

## 2022-04-07 DIAGNOSIS — G250 Essential tremor: Secondary | ICD-10-CM | POA: Diagnosis not present

## 2022-04-07 DIAGNOSIS — E559 Vitamin D deficiency, unspecified: Secondary | ICD-10-CM | POA: Diagnosis not present

## 2022-04-07 DIAGNOSIS — M109 Gout, unspecified: Secondary | ICD-10-CM | POA: Diagnosis not present

## 2022-04-07 DIAGNOSIS — K219 Gastro-esophageal reflux disease without esophagitis: Secondary | ICD-10-CM | POA: Diagnosis not present

## 2022-04-07 DIAGNOSIS — G309 Alzheimer's disease, unspecified: Secondary | ICD-10-CM | POA: Diagnosis not present

## 2022-04-07 DIAGNOSIS — G4762 Sleep related leg cramps: Secondary | ICD-10-CM | POA: Diagnosis not present

## 2022-04-07 DIAGNOSIS — E7800 Pure hypercholesterolemia, unspecified: Secondary | ICD-10-CM | POA: Diagnosis not present

## 2022-04-07 DIAGNOSIS — G8929 Other chronic pain: Secondary | ICD-10-CM | POA: Diagnosis not present

## 2022-04-07 DIAGNOSIS — G2581 Restless legs syndrome: Secondary | ICD-10-CM | POA: Diagnosis not present

## 2022-04-07 DIAGNOSIS — F32A Depression, unspecified: Secondary | ICD-10-CM | POA: Diagnosis not present

## 2022-04-07 DIAGNOSIS — G4700 Insomnia, unspecified: Secondary | ICD-10-CM | POA: Diagnosis not present

## 2022-04-08 DIAGNOSIS — G2581 Restless legs syndrome: Secondary | ICD-10-CM | POA: Diagnosis not present

## 2022-04-08 DIAGNOSIS — E7800 Pure hypercholesterolemia, unspecified: Secondary | ICD-10-CM | POA: Diagnosis not present

## 2022-04-08 DIAGNOSIS — E559 Vitamin D deficiency, unspecified: Secondary | ICD-10-CM | POA: Diagnosis not present

## 2022-04-08 DIAGNOSIS — G4700 Insomnia, unspecified: Secondary | ICD-10-CM | POA: Diagnosis not present

## 2022-04-08 DIAGNOSIS — E1169 Type 2 diabetes mellitus with other specified complication: Secondary | ICD-10-CM | POA: Diagnosis not present

## 2022-04-08 DIAGNOSIS — I951 Orthostatic hypotension: Secondary | ICD-10-CM | POA: Diagnosis not present

## 2022-04-08 DIAGNOSIS — G4762 Sleep related leg cramps: Secondary | ICD-10-CM | POA: Diagnosis not present

## 2022-04-08 DIAGNOSIS — G8929 Other chronic pain: Secondary | ICD-10-CM | POA: Diagnosis not present

## 2022-04-08 DIAGNOSIS — N3281 Overactive bladder: Secondary | ICD-10-CM | POA: Diagnosis not present

## 2022-04-08 DIAGNOSIS — M549 Dorsalgia, unspecified: Secondary | ICD-10-CM | POA: Diagnosis not present

## 2022-04-08 DIAGNOSIS — N184 Chronic kidney disease, stage 4 (severe): Secondary | ICD-10-CM | POA: Diagnosis not present

## 2022-04-08 DIAGNOSIS — I1310 Hypertensive heart and chronic kidney disease without heart failure, with stage 1 through stage 4 chronic kidney disease, or unspecified chronic kidney disease: Secondary | ICD-10-CM | POA: Diagnosis not present

## 2022-04-08 DIAGNOSIS — M109 Gout, unspecified: Secondary | ICD-10-CM | POA: Diagnosis not present

## 2022-04-08 DIAGNOSIS — F32A Depression, unspecified: Secondary | ICD-10-CM | POA: Diagnosis not present

## 2022-04-08 DIAGNOSIS — K219 Gastro-esophageal reflux disease without esophagitis: Secondary | ICD-10-CM | POA: Diagnosis not present

## 2022-04-08 DIAGNOSIS — D631 Anemia in chronic kidney disease: Secondary | ICD-10-CM | POA: Diagnosis not present

## 2022-04-08 DIAGNOSIS — J45909 Unspecified asthma, uncomplicated: Secondary | ICD-10-CM | POA: Diagnosis not present

## 2022-04-08 DIAGNOSIS — G250 Essential tremor: Secondary | ICD-10-CM | POA: Diagnosis not present

## 2022-04-08 DIAGNOSIS — E1122 Type 2 diabetes mellitus with diabetic chronic kidney disease: Secondary | ICD-10-CM | POA: Diagnosis not present

## 2022-04-08 DIAGNOSIS — E1140 Type 2 diabetes mellitus with diabetic neuropathy, unspecified: Secondary | ICD-10-CM | POA: Diagnosis not present

## 2022-04-08 DIAGNOSIS — G309 Alzheimer's disease, unspecified: Secondary | ICD-10-CM | POA: Diagnosis not present

## 2022-04-08 DIAGNOSIS — N179 Acute kidney failure, unspecified: Secondary | ICD-10-CM | POA: Diagnosis not present

## 2022-04-08 DIAGNOSIS — D509 Iron deficiency anemia, unspecified: Secondary | ICD-10-CM | POA: Diagnosis not present

## 2022-04-09 DIAGNOSIS — F32A Depression, unspecified: Secondary | ICD-10-CM | POA: Diagnosis not present

## 2022-04-09 DIAGNOSIS — J45909 Unspecified asthma, uncomplicated: Secondary | ICD-10-CM | POA: Diagnosis not present

## 2022-04-09 DIAGNOSIS — E7800 Pure hypercholesterolemia, unspecified: Secondary | ICD-10-CM | POA: Diagnosis not present

## 2022-04-09 DIAGNOSIS — M109 Gout, unspecified: Secondary | ICD-10-CM | POA: Diagnosis not present

## 2022-04-09 DIAGNOSIS — K219 Gastro-esophageal reflux disease without esophagitis: Secondary | ICD-10-CM | POA: Diagnosis not present

## 2022-04-09 DIAGNOSIS — E1140 Type 2 diabetes mellitus with diabetic neuropathy, unspecified: Secondary | ICD-10-CM | POA: Diagnosis not present

## 2022-04-09 DIAGNOSIS — N179 Acute kidney failure, unspecified: Secondary | ICD-10-CM | POA: Diagnosis not present

## 2022-04-09 DIAGNOSIS — E559 Vitamin D deficiency, unspecified: Secondary | ICD-10-CM | POA: Diagnosis not present

## 2022-04-09 DIAGNOSIS — D509 Iron deficiency anemia, unspecified: Secondary | ICD-10-CM | POA: Diagnosis not present

## 2022-04-09 DIAGNOSIS — D631 Anemia in chronic kidney disease: Secondary | ICD-10-CM | POA: Diagnosis not present

## 2022-04-09 DIAGNOSIS — G4700 Insomnia, unspecified: Secondary | ICD-10-CM | POA: Diagnosis not present

## 2022-04-09 DIAGNOSIS — M549 Dorsalgia, unspecified: Secondary | ICD-10-CM | POA: Diagnosis not present

## 2022-04-09 DIAGNOSIS — E1122 Type 2 diabetes mellitus with diabetic chronic kidney disease: Secondary | ICD-10-CM | POA: Diagnosis not present

## 2022-04-09 DIAGNOSIS — N184 Chronic kidney disease, stage 4 (severe): Secondary | ICD-10-CM | POA: Diagnosis not present

## 2022-04-09 DIAGNOSIS — G8929 Other chronic pain: Secondary | ICD-10-CM | POA: Diagnosis not present

## 2022-04-09 DIAGNOSIS — G4762 Sleep related leg cramps: Secondary | ICD-10-CM | POA: Diagnosis not present

## 2022-04-09 DIAGNOSIS — N3281 Overactive bladder: Secondary | ICD-10-CM | POA: Diagnosis not present

## 2022-04-09 DIAGNOSIS — I1310 Hypertensive heart and chronic kidney disease without heart failure, with stage 1 through stage 4 chronic kidney disease, or unspecified chronic kidney disease: Secondary | ICD-10-CM | POA: Diagnosis not present

## 2022-04-09 DIAGNOSIS — I951 Orthostatic hypotension: Secondary | ICD-10-CM | POA: Diagnosis not present

## 2022-04-09 DIAGNOSIS — G2581 Restless legs syndrome: Secondary | ICD-10-CM | POA: Diagnosis not present

## 2022-04-09 DIAGNOSIS — G250 Essential tremor: Secondary | ICD-10-CM | POA: Diagnosis not present

## 2022-04-09 DIAGNOSIS — E1169 Type 2 diabetes mellitus with other specified complication: Secondary | ICD-10-CM | POA: Diagnosis not present

## 2022-04-09 DIAGNOSIS — G309 Alzheimer's disease, unspecified: Secondary | ICD-10-CM | POA: Diagnosis not present

## 2022-04-10 DIAGNOSIS — D509 Iron deficiency anemia, unspecified: Secondary | ICD-10-CM | POA: Diagnosis not present

## 2022-04-10 DIAGNOSIS — N184 Chronic kidney disease, stage 4 (severe): Secondary | ICD-10-CM | POA: Diagnosis not present

## 2022-04-10 DIAGNOSIS — I1310 Hypertensive heart and chronic kidney disease without heart failure, with stage 1 through stage 4 chronic kidney disease, or unspecified chronic kidney disease: Secondary | ICD-10-CM | POA: Diagnosis not present

## 2022-04-10 DIAGNOSIS — E1122 Type 2 diabetes mellitus with diabetic chronic kidney disease: Secondary | ICD-10-CM | POA: Diagnosis not present

## 2022-04-10 DIAGNOSIS — G309 Alzheimer's disease, unspecified: Secondary | ICD-10-CM | POA: Diagnosis not present

## 2022-04-10 DIAGNOSIS — G8929 Other chronic pain: Secondary | ICD-10-CM | POA: Diagnosis not present

## 2022-04-10 DIAGNOSIS — K219 Gastro-esophageal reflux disease without esophagitis: Secondary | ICD-10-CM | POA: Diagnosis not present

## 2022-04-10 DIAGNOSIS — J45909 Unspecified asthma, uncomplicated: Secondary | ICD-10-CM | POA: Diagnosis not present

## 2022-04-10 DIAGNOSIS — I951 Orthostatic hypotension: Secondary | ICD-10-CM | POA: Diagnosis not present

## 2022-04-10 DIAGNOSIS — G4700 Insomnia, unspecified: Secondary | ICD-10-CM | POA: Diagnosis not present

## 2022-04-10 DIAGNOSIS — G4762 Sleep related leg cramps: Secondary | ICD-10-CM | POA: Diagnosis not present

## 2022-04-10 DIAGNOSIS — N3281 Overactive bladder: Secondary | ICD-10-CM | POA: Diagnosis not present

## 2022-04-10 DIAGNOSIS — D631 Anemia in chronic kidney disease: Secondary | ICD-10-CM | POA: Diagnosis not present

## 2022-04-10 DIAGNOSIS — N179 Acute kidney failure, unspecified: Secondary | ICD-10-CM | POA: Diagnosis not present

## 2022-04-10 DIAGNOSIS — M549 Dorsalgia, unspecified: Secondary | ICD-10-CM | POA: Diagnosis not present

## 2022-04-10 DIAGNOSIS — M109 Gout, unspecified: Secondary | ICD-10-CM | POA: Diagnosis not present

## 2022-04-10 DIAGNOSIS — G250 Essential tremor: Secondary | ICD-10-CM | POA: Diagnosis not present

## 2022-04-10 DIAGNOSIS — E1169 Type 2 diabetes mellitus with other specified complication: Secondary | ICD-10-CM | POA: Diagnosis not present

## 2022-04-10 DIAGNOSIS — G2581 Restless legs syndrome: Secondary | ICD-10-CM | POA: Diagnosis not present

## 2022-04-10 DIAGNOSIS — F32A Depression, unspecified: Secondary | ICD-10-CM | POA: Diagnosis not present

## 2022-04-10 DIAGNOSIS — E1140 Type 2 diabetes mellitus with diabetic neuropathy, unspecified: Secondary | ICD-10-CM | POA: Diagnosis not present

## 2022-04-10 DIAGNOSIS — E559 Vitamin D deficiency, unspecified: Secondary | ICD-10-CM | POA: Diagnosis not present

## 2022-04-10 DIAGNOSIS — E7800 Pure hypercholesterolemia, unspecified: Secondary | ICD-10-CM | POA: Diagnosis not present

## 2022-04-13 DIAGNOSIS — G250 Essential tremor: Secondary | ICD-10-CM | POA: Diagnosis not present

## 2022-04-13 DIAGNOSIS — D631 Anemia in chronic kidney disease: Secondary | ICD-10-CM | POA: Diagnosis not present

## 2022-04-13 DIAGNOSIS — N179 Acute kidney failure, unspecified: Secondary | ICD-10-CM | POA: Diagnosis not present

## 2022-04-13 DIAGNOSIS — G4700 Insomnia, unspecified: Secondary | ICD-10-CM | POA: Diagnosis not present

## 2022-04-13 DIAGNOSIS — J45909 Unspecified asthma, uncomplicated: Secondary | ICD-10-CM | POA: Diagnosis not present

## 2022-04-13 DIAGNOSIS — E1140 Type 2 diabetes mellitus with diabetic neuropathy, unspecified: Secondary | ICD-10-CM | POA: Diagnosis not present

## 2022-04-13 DIAGNOSIS — N3281 Overactive bladder: Secondary | ICD-10-CM | POA: Diagnosis not present

## 2022-04-13 DIAGNOSIS — E559 Vitamin D deficiency, unspecified: Secondary | ICD-10-CM | POA: Diagnosis not present

## 2022-04-13 DIAGNOSIS — G2581 Restless legs syndrome: Secondary | ICD-10-CM | POA: Diagnosis not present

## 2022-04-13 DIAGNOSIS — G4762 Sleep related leg cramps: Secondary | ICD-10-CM | POA: Diagnosis not present

## 2022-04-13 DIAGNOSIS — M109 Gout, unspecified: Secondary | ICD-10-CM | POA: Diagnosis not present

## 2022-04-13 DIAGNOSIS — G309 Alzheimer's disease, unspecified: Secondary | ICD-10-CM | POA: Diagnosis not present

## 2022-04-13 DIAGNOSIS — N184 Chronic kidney disease, stage 4 (severe): Secondary | ICD-10-CM | POA: Diagnosis not present

## 2022-04-13 DIAGNOSIS — F32A Depression, unspecified: Secondary | ICD-10-CM | POA: Diagnosis not present

## 2022-04-13 DIAGNOSIS — D509 Iron deficiency anemia, unspecified: Secondary | ICD-10-CM | POA: Diagnosis not present

## 2022-04-13 DIAGNOSIS — M549 Dorsalgia, unspecified: Secondary | ICD-10-CM | POA: Diagnosis not present

## 2022-04-13 DIAGNOSIS — E1169 Type 2 diabetes mellitus with other specified complication: Secondary | ICD-10-CM | POA: Diagnosis not present

## 2022-04-13 DIAGNOSIS — G8929 Other chronic pain: Secondary | ICD-10-CM | POA: Diagnosis not present

## 2022-04-13 DIAGNOSIS — K219 Gastro-esophageal reflux disease without esophagitis: Secondary | ICD-10-CM | POA: Diagnosis not present

## 2022-04-13 DIAGNOSIS — E1122 Type 2 diabetes mellitus with diabetic chronic kidney disease: Secondary | ICD-10-CM | POA: Diagnosis not present

## 2022-04-13 DIAGNOSIS — I951 Orthostatic hypotension: Secondary | ICD-10-CM | POA: Diagnosis not present

## 2022-04-13 DIAGNOSIS — E7800 Pure hypercholesterolemia, unspecified: Secondary | ICD-10-CM | POA: Diagnosis not present

## 2022-04-13 DIAGNOSIS — I1310 Hypertensive heart and chronic kidney disease without heart failure, with stage 1 through stage 4 chronic kidney disease, or unspecified chronic kidney disease: Secondary | ICD-10-CM | POA: Diagnosis not present

## 2022-04-14 DIAGNOSIS — G4762 Sleep related leg cramps: Secondary | ICD-10-CM | POA: Diagnosis not present

## 2022-04-14 DIAGNOSIS — D631 Anemia in chronic kidney disease: Secondary | ICD-10-CM | POA: Diagnosis not present

## 2022-04-14 DIAGNOSIS — E1140 Type 2 diabetes mellitus with diabetic neuropathy, unspecified: Secondary | ICD-10-CM | POA: Diagnosis not present

## 2022-04-14 DIAGNOSIS — M549 Dorsalgia, unspecified: Secondary | ICD-10-CM | POA: Diagnosis not present

## 2022-04-14 DIAGNOSIS — I1310 Hypertensive heart and chronic kidney disease without heart failure, with stage 1 through stage 4 chronic kidney disease, or unspecified chronic kidney disease: Secondary | ICD-10-CM | POA: Diagnosis not present

## 2022-04-14 DIAGNOSIS — G8929 Other chronic pain: Secondary | ICD-10-CM | POA: Diagnosis not present

## 2022-04-14 DIAGNOSIS — N179 Acute kidney failure, unspecified: Secondary | ICD-10-CM | POA: Diagnosis not present

## 2022-04-14 DIAGNOSIS — D509 Iron deficiency anemia, unspecified: Secondary | ICD-10-CM | POA: Diagnosis not present

## 2022-04-14 DIAGNOSIS — K219 Gastro-esophageal reflux disease without esophagitis: Secondary | ICD-10-CM | POA: Diagnosis not present

## 2022-04-14 DIAGNOSIS — G250 Essential tremor: Secondary | ICD-10-CM | POA: Diagnosis not present

## 2022-04-14 DIAGNOSIS — G309 Alzheimer's disease, unspecified: Secondary | ICD-10-CM | POA: Diagnosis not present

## 2022-04-14 DIAGNOSIS — E1169 Type 2 diabetes mellitus with other specified complication: Secondary | ICD-10-CM | POA: Diagnosis not present

## 2022-04-14 DIAGNOSIS — F32A Depression, unspecified: Secondary | ICD-10-CM | POA: Diagnosis not present

## 2022-04-14 DIAGNOSIS — E559 Vitamin D deficiency, unspecified: Secondary | ICD-10-CM | POA: Diagnosis not present

## 2022-04-14 DIAGNOSIS — N184 Chronic kidney disease, stage 4 (severe): Secondary | ICD-10-CM | POA: Diagnosis not present

## 2022-04-14 DIAGNOSIS — E1122 Type 2 diabetes mellitus with diabetic chronic kidney disease: Secondary | ICD-10-CM | POA: Diagnosis not present

## 2022-04-14 DIAGNOSIS — E7800 Pure hypercholesterolemia, unspecified: Secondary | ICD-10-CM | POA: Diagnosis not present

## 2022-04-14 DIAGNOSIS — M109 Gout, unspecified: Secondary | ICD-10-CM | POA: Diagnosis not present

## 2022-04-14 DIAGNOSIS — G2581 Restless legs syndrome: Secondary | ICD-10-CM | POA: Diagnosis not present

## 2022-04-14 DIAGNOSIS — I951 Orthostatic hypotension: Secondary | ICD-10-CM | POA: Diagnosis not present

## 2022-04-14 DIAGNOSIS — J45909 Unspecified asthma, uncomplicated: Secondary | ICD-10-CM | POA: Diagnosis not present

## 2022-04-14 DIAGNOSIS — G4700 Insomnia, unspecified: Secondary | ICD-10-CM | POA: Diagnosis not present

## 2022-04-14 DIAGNOSIS — N3281 Overactive bladder: Secondary | ICD-10-CM | POA: Diagnosis not present

## 2022-04-15 DIAGNOSIS — E7800 Pure hypercholesterolemia, unspecified: Secondary | ICD-10-CM | POA: Diagnosis not present

## 2022-04-15 DIAGNOSIS — G2581 Restless legs syndrome: Secondary | ICD-10-CM | POA: Diagnosis not present

## 2022-04-15 DIAGNOSIS — I1310 Hypertensive heart and chronic kidney disease without heart failure, with stage 1 through stage 4 chronic kidney disease, or unspecified chronic kidney disease: Secondary | ICD-10-CM | POA: Diagnosis not present

## 2022-04-15 DIAGNOSIS — G4700 Insomnia, unspecified: Secondary | ICD-10-CM | POA: Diagnosis not present

## 2022-04-15 DIAGNOSIS — G309 Alzheimer's disease, unspecified: Secondary | ICD-10-CM | POA: Diagnosis not present

## 2022-04-15 DIAGNOSIS — E1122 Type 2 diabetes mellitus with diabetic chronic kidney disease: Secondary | ICD-10-CM | POA: Diagnosis not present

## 2022-04-15 DIAGNOSIS — E1169 Type 2 diabetes mellitus with other specified complication: Secondary | ICD-10-CM | POA: Diagnosis not present

## 2022-04-15 DIAGNOSIS — E1140 Type 2 diabetes mellitus with diabetic neuropathy, unspecified: Secondary | ICD-10-CM | POA: Diagnosis not present

## 2022-04-15 DIAGNOSIS — E559 Vitamin D deficiency, unspecified: Secondary | ICD-10-CM | POA: Diagnosis not present

## 2022-04-15 DIAGNOSIS — G8929 Other chronic pain: Secondary | ICD-10-CM | POA: Diagnosis not present

## 2022-04-15 DIAGNOSIS — I951 Orthostatic hypotension: Secondary | ICD-10-CM | POA: Diagnosis not present

## 2022-04-15 DIAGNOSIS — N184 Chronic kidney disease, stage 4 (severe): Secondary | ICD-10-CM | POA: Diagnosis not present

## 2022-04-15 DIAGNOSIS — F32A Depression, unspecified: Secondary | ICD-10-CM | POA: Diagnosis not present

## 2022-04-15 DIAGNOSIS — M109 Gout, unspecified: Secondary | ICD-10-CM | POA: Diagnosis not present

## 2022-04-15 DIAGNOSIS — N179 Acute kidney failure, unspecified: Secondary | ICD-10-CM | POA: Diagnosis not present

## 2022-04-15 DIAGNOSIS — J45909 Unspecified asthma, uncomplicated: Secondary | ICD-10-CM | POA: Diagnosis not present

## 2022-04-15 DIAGNOSIS — G250 Essential tremor: Secondary | ICD-10-CM | POA: Diagnosis not present

## 2022-04-15 DIAGNOSIS — G4762 Sleep related leg cramps: Secondary | ICD-10-CM | POA: Diagnosis not present

## 2022-04-15 DIAGNOSIS — K219 Gastro-esophageal reflux disease without esophagitis: Secondary | ICD-10-CM | POA: Diagnosis not present

## 2022-04-15 DIAGNOSIS — M549 Dorsalgia, unspecified: Secondary | ICD-10-CM | POA: Diagnosis not present

## 2022-04-15 DIAGNOSIS — D631 Anemia in chronic kidney disease: Secondary | ICD-10-CM | POA: Diagnosis not present

## 2022-04-15 DIAGNOSIS — D509 Iron deficiency anemia, unspecified: Secondary | ICD-10-CM | POA: Diagnosis not present

## 2022-04-15 DIAGNOSIS — N3281 Overactive bladder: Secondary | ICD-10-CM | POA: Diagnosis not present

## 2022-04-16 DIAGNOSIS — E1169 Type 2 diabetes mellitus with other specified complication: Secondary | ICD-10-CM | POA: Diagnosis not present

## 2022-04-16 DIAGNOSIS — G250 Essential tremor: Secondary | ICD-10-CM | POA: Diagnosis not present

## 2022-04-16 DIAGNOSIS — F32A Depression, unspecified: Secondary | ICD-10-CM | POA: Diagnosis not present

## 2022-04-16 DIAGNOSIS — E7800 Pure hypercholesterolemia, unspecified: Secondary | ICD-10-CM | POA: Diagnosis not present

## 2022-04-16 DIAGNOSIS — G2581 Restless legs syndrome: Secondary | ICD-10-CM | POA: Diagnosis not present

## 2022-04-16 DIAGNOSIS — I951 Orthostatic hypotension: Secondary | ICD-10-CM | POA: Diagnosis not present

## 2022-04-16 DIAGNOSIS — I1310 Hypertensive heart and chronic kidney disease without heart failure, with stage 1 through stage 4 chronic kidney disease, or unspecified chronic kidney disease: Secondary | ICD-10-CM | POA: Diagnosis not present

## 2022-04-16 DIAGNOSIS — N3281 Overactive bladder: Secondary | ICD-10-CM | POA: Diagnosis not present

## 2022-04-16 DIAGNOSIS — N179 Acute kidney failure, unspecified: Secondary | ICD-10-CM | POA: Diagnosis not present

## 2022-04-16 DIAGNOSIS — N184 Chronic kidney disease, stage 4 (severe): Secondary | ICD-10-CM | POA: Diagnosis not present

## 2022-04-16 DIAGNOSIS — G4700 Insomnia, unspecified: Secondary | ICD-10-CM | POA: Diagnosis not present

## 2022-04-16 DIAGNOSIS — D509 Iron deficiency anemia, unspecified: Secondary | ICD-10-CM | POA: Diagnosis not present

## 2022-04-16 DIAGNOSIS — G8929 Other chronic pain: Secondary | ICD-10-CM | POA: Diagnosis not present

## 2022-04-16 DIAGNOSIS — G4762 Sleep related leg cramps: Secondary | ICD-10-CM | POA: Diagnosis not present

## 2022-04-16 DIAGNOSIS — G309 Alzheimer's disease, unspecified: Secondary | ICD-10-CM | POA: Diagnosis not present

## 2022-04-16 DIAGNOSIS — E1122 Type 2 diabetes mellitus with diabetic chronic kidney disease: Secondary | ICD-10-CM | POA: Diagnosis not present

## 2022-04-16 DIAGNOSIS — J45909 Unspecified asthma, uncomplicated: Secondary | ICD-10-CM | POA: Diagnosis not present

## 2022-04-16 DIAGNOSIS — M549 Dorsalgia, unspecified: Secondary | ICD-10-CM | POA: Diagnosis not present

## 2022-04-16 DIAGNOSIS — E559 Vitamin D deficiency, unspecified: Secondary | ICD-10-CM | POA: Diagnosis not present

## 2022-04-16 DIAGNOSIS — E1140 Type 2 diabetes mellitus with diabetic neuropathy, unspecified: Secondary | ICD-10-CM | POA: Diagnosis not present

## 2022-04-16 DIAGNOSIS — K219 Gastro-esophageal reflux disease without esophagitis: Secondary | ICD-10-CM | POA: Diagnosis not present

## 2022-04-16 DIAGNOSIS — M109 Gout, unspecified: Secondary | ICD-10-CM | POA: Diagnosis not present

## 2022-04-16 DIAGNOSIS — D631 Anemia in chronic kidney disease: Secondary | ICD-10-CM | POA: Diagnosis not present

## 2022-04-17 DIAGNOSIS — G8929 Other chronic pain: Secondary | ICD-10-CM | POA: Diagnosis not present

## 2022-04-17 DIAGNOSIS — M545 Low back pain, unspecified: Secondary | ICD-10-CM | POA: Diagnosis not present

## 2022-04-17 DIAGNOSIS — F01A0 Vascular dementia, mild, without behavioral disturbance, psychotic disturbance, mood disturbance, and anxiety: Secondary | ICD-10-CM | POA: Diagnosis not present

## 2022-04-17 DIAGNOSIS — N183 Chronic kidney disease, stage 3 unspecified: Secondary | ICD-10-CM | POA: Diagnosis not present

## 2022-04-17 DIAGNOSIS — I129 Hypertensive chronic kidney disease with stage 1 through stage 4 chronic kidney disease, or unspecified chronic kidney disease: Secondary | ICD-10-CM | POA: Diagnosis not present

## 2022-04-17 DIAGNOSIS — M47816 Spondylosis without myelopathy or radiculopathy, lumbar region: Secondary | ICD-10-CM | POA: Diagnosis not present

## 2022-04-18 DIAGNOSIS — G4733 Obstructive sleep apnea (adult) (pediatric): Secondary | ICD-10-CM | POA: Diagnosis not present

## 2022-04-20 DIAGNOSIS — G4700 Insomnia, unspecified: Secondary | ICD-10-CM | POA: Diagnosis not present

## 2022-04-20 DIAGNOSIS — I951 Orthostatic hypotension: Secondary | ICD-10-CM | POA: Diagnosis not present

## 2022-04-20 DIAGNOSIS — E1122 Type 2 diabetes mellitus with diabetic chronic kidney disease: Secondary | ICD-10-CM | POA: Diagnosis not present

## 2022-04-20 DIAGNOSIS — J45909 Unspecified asthma, uncomplicated: Secondary | ICD-10-CM | POA: Diagnosis not present

## 2022-04-20 DIAGNOSIS — N3281 Overactive bladder: Secondary | ICD-10-CM | POA: Diagnosis not present

## 2022-04-20 DIAGNOSIS — G2581 Restless legs syndrome: Secondary | ICD-10-CM | POA: Diagnosis not present

## 2022-04-20 DIAGNOSIS — G4762 Sleep related leg cramps: Secondary | ICD-10-CM | POA: Diagnosis not present

## 2022-04-20 DIAGNOSIS — E559 Vitamin D deficiency, unspecified: Secondary | ICD-10-CM | POA: Diagnosis not present

## 2022-04-20 DIAGNOSIS — N179 Acute kidney failure, unspecified: Secondary | ICD-10-CM | POA: Diagnosis not present

## 2022-04-20 DIAGNOSIS — M549 Dorsalgia, unspecified: Secondary | ICD-10-CM | POA: Diagnosis not present

## 2022-04-20 DIAGNOSIS — M109 Gout, unspecified: Secondary | ICD-10-CM | POA: Diagnosis not present

## 2022-04-20 DIAGNOSIS — D509 Iron deficiency anemia, unspecified: Secondary | ICD-10-CM | POA: Diagnosis not present

## 2022-04-20 DIAGNOSIS — G250 Essential tremor: Secondary | ICD-10-CM | POA: Diagnosis not present

## 2022-04-20 DIAGNOSIS — K219 Gastro-esophageal reflux disease without esophagitis: Secondary | ICD-10-CM | POA: Diagnosis not present

## 2022-04-20 DIAGNOSIS — E1169 Type 2 diabetes mellitus with other specified complication: Secondary | ICD-10-CM | POA: Diagnosis not present

## 2022-04-20 DIAGNOSIS — D631 Anemia in chronic kidney disease: Secondary | ICD-10-CM | POA: Diagnosis not present

## 2022-04-20 DIAGNOSIS — F32A Depression, unspecified: Secondary | ICD-10-CM | POA: Diagnosis not present

## 2022-04-20 DIAGNOSIS — G309 Alzheimer's disease, unspecified: Secondary | ICD-10-CM | POA: Diagnosis not present

## 2022-04-20 DIAGNOSIS — N184 Chronic kidney disease, stage 4 (severe): Secondary | ICD-10-CM | POA: Diagnosis not present

## 2022-04-20 DIAGNOSIS — E7800 Pure hypercholesterolemia, unspecified: Secondary | ICD-10-CM | POA: Diagnosis not present

## 2022-04-20 DIAGNOSIS — E1140 Type 2 diabetes mellitus with diabetic neuropathy, unspecified: Secondary | ICD-10-CM | POA: Diagnosis not present

## 2022-04-20 DIAGNOSIS — G8929 Other chronic pain: Secondary | ICD-10-CM | POA: Diagnosis not present

## 2022-04-20 DIAGNOSIS — I1310 Hypertensive heart and chronic kidney disease without heart failure, with stage 1 through stage 4 chronic kidney disease, or unspecified chronic kidney disease: Secondary | ICD-10-CM | POA: Diagnosis not present

## 2022-04-20 DIAGNOSIS — H905 Unspecified sensorineural hearing loss: Secondary | ICD-10-CM | POA: Diagnosis not present

## 2022-04-21 DIAGNOSIS — M5442 Lumbago with sciatica, left side: Secondary | ICD-10-CM | POA: Diagnosis not present

## 2022-04-21 DIAGNOSIS — K219 Gastro-esophageal reflux disease without esophagitis: Secondary | ICD-10-CM | POA: Diagnosis not present

## 2022-04-21 DIAGNOSIS — E559 Vitamin D deficiency, unspecified: Secondary | ICD-10-CM | POA: Diagnosis not present

## 2022-04-21 DIAGNOSIS — E119 Type 2 diabetes mellitus without complications: Secondary | ICD-10-CM | POA: Diagnosis not present

## 2022-04-21 DIAGNOSIS — G8929 Other chronic pain: Secondary | ICD-10-CM | POA: Diagnosis not present

## 2022-04-21 DIAGNOSIS — E1140 Type 2 diabetes mellitus with diabetic neuropathy, unspecified: Secondary | ICD-10-CM | POA: Diagnosis not present

## 2022-04-21 DIAGNOSIS — N184 Chronic kidney disease, stage 4 (severe): Secondary | ICD-10-CM | POA: Diagnosis not present

## 2022-04-21 DIAGNOSIS — M549 Dorsalgia, unspecified: Secondary | ICD-10-CM | POA: Diagnosis not present

## 2022-04-21 DIAGNOSIS — N179 Acute kidney failure, unspecified: Secondary | ICD-10-CM | POA: Diagnosis not present

## 2022-04-21 DIAGNOSIS — G250 Essential tremor: Secondary | ICD-10-CM | POA: Diagnosis not present

## 2022-04-21 DIAGNOSIS — G4700 Insomnia, unspecified: Secondary | ICD-10-CM | POA: Diagnosis not present

## 2022-04-21 DIAGNOSIS — G4762 Sleep related leg cramps: Secondary | ICD-10-CM | POA: Diagnosis not present

## 2022-04-21 DIAGNOSIS — N3281 Overactive bladder: Secondary | ICD-10-CM | POA: Diagnosis not present

## 2022-04-21 DIAGNOSIS — F32A Depression, unspecified: Secondary | ICD-10-CM | POA: Diagnosis not present

## 2022-04-21 DIAGNOSIS — M109 Gout, unspecified: Secondary | ICD-10-CM | POA: Diagnosis not present

## 2022-04-21 DIAGNOSIS — E7800 Pure hypercholesterolemia, unspecified: Secondary | ICD-10-CM | POA: Diagnosis not present

## 2022-04-21 DIAGNOSIS — I951 Orthostatic hypotension: Secondary | ICD-10-CM | POA: Diagnosis not present

## 2022-04-21 DIAGNOSIS — D631 Anemia in chronic kidney disease: Secondary | ICD-10-CM | POA: Diagnosis not present

## 2022-04-21 DIAGNOSIS — E1122 Type 2 diabetes mellitus with diabetic chronic kidney disease: Secondary | ICD-10-CM | POA: Diagnosis not present

## 2022-04-21 DIAGNOSIS — D509 Iron deficiency anemia, unspecified: Secondary | ICD-10-CM | POA: Diagnosis not present

## 2022-04-21 DIAGNOSIS — E1169 Type 2 diabetes mellitus with other specified complication: Secondary | ICD-10-CM | POA: Diagnosis not present

## 2022-04-21 DIAGNOSIS — G2581 Restless legs syndrome: Secondary | ICD-10-CM | POA: Diagnosis not present

## 2022-04-21 DIAGNOSIS — J45909 Unspecified asthma, uncomplicated: Secondary | ICD-10-CM | POA: Diagnosis not present

## 2022-04-21 DIAGNOSIS — G309 Alzheimer's disease, unspecified: Secondary | ICD-10-CM | POA: Diagnosis not present

## 2022-04-21 DIAGNOSIS — I1310 Hypertensive heart and chronic kidney disease without heart failure, with stage 1 through stage 4 chronic kidney disease, or unspecified chronic kidney disease: Secondary | ICD-10-CM | POA: Diagnosis not present

## 2022-04-22 DIAGNOSIS — D509 Iron deficiency anemia, unspecified: Secondary | ICD-10-CM | POA: Diagnosis not present

## 2022-04-22 DIAGNOSIS — E1140 Type 2 diabetes mellitus with diabetic neuropathy, unspecified: Secondary | ICD-10-CM | POA: Diagnosis not present

## 2022-04-22 DIAGNOSIS — N3281 Overactive bladder: Secondary | ICD-10-CM | POA: Diagnosis not present

## 2022-04-22 DIAGNOSIS — F32A Depression, unspecified: Secondary | ICD-10-CM | POA: Diagnosis not present

## 2022-04-22 DIAGNOSIS — E1122 Type 2 diabetes mellitus with diabetic chronic kidney disease: Secondary | ICD-10-CM | POA: Diagnosis not present

## 2022-04-22 DIAGNOSIS — G4700 Insomnia, unspecified: Secondary | ICD-10-CM | POA: Diagnosis not present

## 2022-04-22 DIAGNOSIS — I951 Orthostatic hypotension: Secondary | ICD-10-CM | POA: Diagnosis not present

## 2022-04-22 DIAGNOSIS — I1310 Hypertensive heart and chronic kidney disease without heart failure, with stage 1 through stage 4 chronic kidney disease, or unspecified chronic kidney disease: Secondary | ICD-10-CM | POA: Diagnosis not present

## 2022-04-22 DIAGNOSIS — G4762 Sleep related leg cramps: Secondary | ICD-10-CM | POA: Diagnosis not present

## 2022-04-22 DIAGNOSIS — D631 Anemia in chronic kidney disease: Secondary | ICD-10-CM | POA: Diagnosis not present

## 2022-04-22 DIAGNOSIS — K219 Gastro-esophageal reflux disease without esophagitis: Secondary | ICD-10-CM | POA: Diagnosis not present

## 2022-04-22 DIAGNOSIS — E559 Vitamin D deficiency, unspecified: Secondary | ICD-10-CM | POA: Diagnosis not present

## 2022-04-22 DIAGNOSIS — N179 Acute kidney failure, unspecified: Secondary | ICD-10-CM | POA: Diagnosis not present

## 2022-04-22 DIAGNOSIS — M109 Gout, unspecified: Secondary | ICD-10-CM | POA: Diagnosis not present

## 2022-04-22 DIAGNOSIS — E1169 Type 2 diabetes mellitus with other specified complication: Secondary | ICD-10-CM | POA: Diagnosis not present

## 2022-04-22 DIAGNOSIS — E7800 Pure hypercholesterolemia, unspecified: Secondary | ICD-10-CM | POA: Diagnosis not present

## 2022-04-22 DIAGNOSIS — G309 Alzheimer's disease, unspecified: Secondary | ICD-10-CM | POA: Diagnosis not present

## 2022-04-22 DIAGNOSIS — G250 Essential tremor: Secondary | ICD-10-CM | POA: Diagnosis not present

## 2022-04-22 DIAGNOSIS — G2581 Restless legs syndrome: Secondary | ICD-10-CM | POA: Diagnosis not present

## 2022-04-22 DIAGNOSIS — N184 Chronic kidney disease, stage 4 (severe): Secondary | ICD-10-CM | POA: Diagnosis not present

## 2022-04-22 DIAGNOSIS — J45909 Unspecified asthma, uncomplicated: Secondary | ICD-10-CM | POA: Diagnosis not present

## 2022-04-22 DIAGNOSIS — G8929 Other chronic pain: Secondary | ICD-10-CM | POA: Diagnosis not present

## 2022-04-22 DIAGNOSIS — M549 Dorsalgia, unspecified: Secondary | ICD-10-CM | POA: Diagnosis not present

## 2022-04-23 DIAGNOSIS — E1140 Type 2 diabetes mellitus with diabetic neuropathy, unspecified: Secondary | ICD-10-CM | POA: Diagnosis not present

## 2022-04-23 DIAGNOSIS — G4762 Sleep related leg cramps: Secondary | ICD-10-CM | POA: Diagnosis not present

## 2022-04-23 DIAGNOSIS — G250 Essential tremor: Secondary | ICD-10-CM | POA: Diagnosis not present

## 2022-04-23 DIAGNOSIS — M549 Dorsalgia, unspecified: Secondary | ICD-10-CM | POA: Diagnosis not present

## 2022-04-23 DIAGNOSIS — D509 Iron deficiency anemia, unspecified: Secondary | ICD-10-CM | POA: Diagnosis not present

## 2022-04-23 DIAGNOSIS — K219 Gastro-esophageal reflux disease without esophagitis: Secondary | ICD-10-CM | POA: Diagnosis not present

## 2022-04-23 DIAGNOSIS — E7800 Pure hypercholesterolemia, unspecified: Secondary | ICD-10-CM | POA: Diagnosis not present

## 2022-04-23 DIAGNOSIS — E1122 Type 2 diabetes mellitus with diabetic chronic kidney disease: Secondary | ICD-10-CM | POA: Diagnosis not present

## 2022-04-23 DIAGNOSIS — E559 Vitamin D deficiency, unspecified: Secondary | ICD-10-CM | POA: Diagnosis not present

## 2022-04-23 DIAGNOSIS — N3281 Overactive bladder: Secondary | ICD-10-CM | POA: Diagnosis not present

## 2022-04-23 DIAGNOSIS — N184 Chronic kidney disease, stage 4 (severe): Secondary | ICD-10-CM | POA: Diagnosis not present

## 2022-04-23 DIAGNOSIS — J45909 Unspecified asthma, uncomplicated: Secondary | ICD-10-CM | POA: Diagnosis not present

## 2022-04-23 DIAGNOSIS — E1169 Type 2 diabetes mellitus with other specified complication: Secondary | ICD-10-CM | POA: Diagnosis not present

## 2022-04-23 DIAGNOSIS — G2581 Restless legs syndrome: Secondary | ICD-10-CM | POA: Diagnosis not present

## 2022-04-23 DIAGNOSIS — I951 Orthostatic hypotension: Secondary | ICD-10-CM | POA: Diagnosis not present

## 2022-04-23 DIAGNOSIS — G309 Alzheimer's disease, unspecified: Secondary | ICD-10-CM | POA: Diagnosis not present

## 2022-04-23 DIAGNOSIS — D631 Anemia in chronic kidney disease: Secondary | ICD-10-CM | POA: Diagnosis not present

## 2022-04-23 DIAGNOSIS — M109 Gout, unspecified: Secondary | ICD-10-CM | POA: Diagnosis not present

## 2022-04-23 DIAGNOSIS — N179 Acute kidney failure, unspecified: Secondary | ICD-10-CM | POA: Diagnosis not present

## 2022-04-23 DIAGNOSIS — I1310 Hypertensive heart and chronic kidney disease without heart failure, with stage 1 through stage 4 chronic kidney disease, or unspecified chronic kidney disease: Secondary | ICD-10-CM | POA: Diagnosis not present

## 2022-04-23 DIAGNOSIS — F32A Depression, unspecified: Secondary | ICD-10-CM | POA: Diagnosis not present

## 2022-04-23 DIAGNOSIS — G4700 Insomnia, unspecified: Secondary | ICD-10-CM | POA: Diagnosis not present

## 2022-04-23 DIAGNOSIS — G8929 Other chronic pain: Secondary | ICD-10-CM | POA: Diagnosis not present

## 2022-04-24 DIAGNOSIS — E7800 Pure hypercholesterolemia, unspecified: Secondary | ICD-10-CM | POA: Diagnosis not present

## 2022-04-24 DIAGNOSIS — D631 Anemia in chronic kidney disease: Secondary | ICD-10-CM | POA: Diagnosis not present

## 2022-04-24 DIAGNOSIS — J45909 Unspecified asthma, uncomplicated: Secondary | ICD-10-CM | POA: Diagnosis not present

## 2022-04-24 DIAGNOSIS — M549 Dorsalgia, unspecified: Secondary | ICD-10-CM | POA: Diagnosis not present

## 2022-04-24 DIAGNOSIS — G4700 Insomnia, unspecified: Secondary | ICD-10-CM | POA: Diagnosis not present

## 2022-04-24 DIAGNOSIS — I129 Hypertensive chronic kidney disease with stage 1 through stage 4 chronic kidney disease, or unspecified chronic kidney disease: Secondary | ICD-10-CM | POA: Diagnosis not present

## 2022-04-24 DIAGNOSIS — E1169 Type 2 diabetes mellitus with other specified complication: Secondary | ICD-10-CM | POA: Diagnosis not present

## 2022-04-24 DIAGNOSIS — G250 Essential tremor: Secondary | ICD-10-CM | POA: Diagnosis not present

## 2022-04-24 DIAGNOSIS — K219 Gastro-esophageal reflux disease without esophagitis: Secondary | ICD-10-CM | POA: Diagnosis not present

## 2022-04-24 DIAGNOSIS — G4762 Sleep related leg cramps: Secondary | ICD-10-CM | POA: Diagnosis not present

## 2022-04-24 DIAGNOSIS — E1122 Type 2 diabetes mellitus with diabetic chronic kidney disease: Secondary | ICD-10-CM | POA: Diagnosis not present

## 2022-04-24 DIAGNOSIS — G2581 Restless legs syndrome: Secondary | ICD-10-CM | POA: Diagnosis not present

## 2022-04-24 DIAGNOSIS — I1310 Hypertensive heart and chronic kidney disease without heart failure, with stage 1 through stage 4 chronic kidney disease, or unspecified chronic kidney disease: Secondary | ICD-10-CM | POA: Diagnosis not present

## 2022-04-24 DIAGNOSIS — E559 Vitamin D deficiency, unspecified: Secondary | ICD-10-CM | POA: Diagnosis not present

## 2022-04-24 DIAGNOSIS — N183 Chronic kidney disease, stage 3 unspecified: Secondary | ICD-10-CM | POA: Diagnosis not present

## 2022-04-24 DIAGNOSIS — G309 Alzheimer's disease, unspecified: Secondary | ICD-10-CM | POA: Diagnosis not present

## 2022-04-24 DIAGNOSIS — F32A Depression, unspecified: Secondary | ICD-10-CM | POA: Diagnosis not present

## 2022-04-24 DIAGNOSIS — M109 Gout, unspecified: Secondary | ICD-10-CM | POA: Diagnosis not present

## 2022-04-24 DIAGNOSIS — D509 Iron deficiency anemia, unspecified: Secondary | ICD-10-CM | POA: Diagnosis not present

## 2022-04-24 DIAGNOSIS — N179 Acute kidney failure, unspecified: Secondary | ICD-10-CM | POA: Diagnosis not present

## 2022-04-24 DIAGNOSIS — E1140 Type 2 diabetes mellitus with diabetic neuropathy, unspecified: Secondary | ICD-10-CM | POA: Diagnosis not present

## 2022-04-24 DIAGNOSIS — G8929 Other chronic pain: Secondary | ICD-10-CM | POA: Diagnosis not present

## 2022-04-24 DIAGNOSIS — N184 Chronic kidney disease, stage 4 (severe): Secondary | ICD-10-CM | POA: Diagnosis not present

## 2022-04-24 DIAGNOSIS — I951 Orthostatic hypotension: Secondary | ICD-10-CM | POA: Diagnosis not present

## 2022-04-24 DIAGNOSIS — N3281 Overactive bladder: Secondary | ICD-10-CM | POA: Diagnosis not present

## 2022-04-27 DIAGNOSIS — K219 Gastro-esophageal reflux disease without esophagitis: Secondary | ICD-10-CM | POA: Diagnosis not present

## 2022-04-27 DIAGNOSIS — N179 Acute kidney failure, unspecified: Secondary | ICD-10-CM | POA: Diagnosis not present

## 2022-04-27 DIAGNOSIS — M549 Dorsalgia, unspecified: Secondary | ICD-10-CM | POA: Diagnosis not present

## 2022-04-27 DIAGNOSIS — G2581 Restless legs syndrome: Secondary | ICD-10-CM | POA: Diagnosis not present

## 2022-04-27 DIAGNOSIS — M109 Gout, unspecified: Secondary | ICD-10-CM | POA: Diagnosis not present

## 2022-04-27 DIAGNOSIS — N3281 Overactive bladder: Secondary | ICD-10-CM | POA: Diagnosis not present

## 2022-04-27 DIAGNOSIS — E7800 Pure hypercholesterolemia, unspecified: Secondary | ICD-10-CM | POA: Diagnosis not present

## 2022-04-27 DIAGNOSIS — D509 Iron deficiency anemia, unspecified: Secondary | ICD-10-CM | POA: Diagnosis not present

## 2022-04-27 DIAGNOSIS — E1140 Type 2 diabetes mellitus with diabetic neuropathy, unspecified: Secondary | ICD-10-CM | POA: Diagnosis not present

## 2022-04-27 DIAGNOSIS — E559 Vitamin D deficiency, unspecified: Secondary | ICD-10-CM | POA: Diagnosis not present

## 2022-04-27 DIAGNOSIS — D631 Anemia in chronic kidney disease: Secondary | ICD-10-CM | POA: Diagnosis not present

## 2022-04-27 DIAGNOSIS — I1310 Hypertensive heart and chronic kidney disease without heart failure, with stage 1 through stage 4 chronic kidney disease, or unspecified chronic kidney disease: Secondary | ICD-10-CM | POA: Diagnosis not present

## 2022-04-27 DIAGNOSIS — E1169 Type 2 diabetes mellitus with other specified complication: Secondary | ICD-10-CM | POA: Diagnosis not present

## 2022-04-27 DIAGNOSIS — G4762 Sleep related leg cramps: Secondary | ICD-10-CM | POA: Diagnosis not present

## 2022-04-27 DIAGNOSIS — F32A Depression, unspecified: Secondary | ICD-10-CM | POA: Diagnosis not present

## 2022-04-27 DIAGNOSIS — G4700 Insomnia, unspecified: Secondary | ICD-10-CM | POA: Diagnosis not present

## 2022-04-27 DIAGNOSIS — N184 Chronic kidney disease, stage 4 (severe): Secondary | ICD-10-CM | POA: Diagnosis not present

## 2022-04-27 DIAGNOSIS — G8929 Other chronic pain: Secondary | ICD-10-CM | POA: Diagnosis not present

## 2022-04-27 DIAGNOSIS — E1122 Type 2 diabetes mellitus with diabetic chronic kidney disease: Secondary | ICD-10-CM | POA: Diagnosis not present

## 2022-04-27 DIAGNOSIS — G250 Essential tremor: Secondary | ICD-10-CM | POA: Diagnosis not present

## 2022-04-27 DIAGNOSIS — I951 Orthostatic hypotension: Secondary | ICD-10-CM | POA: Diagnosis not present

## 2022-04-27 DIAGNOSIS — G309 Alzheimer's disease, unspecified: Secondary | ICD-10-CM | POA: Diagnosis not present

## 2022-04-27 DIAGNOSIS — J45909 Unspecified asthma, uncomplicated: Secondary | ICD-10-CM | POA: Diagnosis not present

## 2022-04-28 DIAGNOSIS — M549 Dorsalgia, unspecified: Secondary | ICD-10-CM | POA: Diagnosis not present

## 2022-04-28 DIAGNOSIS — E1122 Type 2 diabetes mellitus with diabetic chronic kidney disease: Secondary | ICD-10-CM | POA: Diagnosis not present

## 2022-04-28 DIAGNOSIS — G2581 Restless legs syndrome: Secondary | ICD-10-CM | POA: Diagnosis not present

## 2022-04-28 DIAGNOSIS — E1169 Type 2 diabetes mellitus with other specified complication: Secondary | ICD-10-CM | POA: Diagnosis not present

## 2022-04-28 DIAGNOSIS — N3281 Overactive bladder: Secondary | ICD-10-CM | POA: Diagnosis not present

## 2022-04-28 DIAGNOSIS — I1310 Hypertensive heart and chronic kidney disease without heart failure, with stage 1 through stage 4 chronic kidney disease, or unspecified chronic kidney disease: Secondary | ICD-10-CM | POA: Diagnosis not present

## 2022-04-28 DIAGNOSIS — M7989 Other specified soft tissue disorders: Secondary | ICD-10-CM

## 2022-04-28 DIAGNOSIS — N1832 Chronic kidney disease, stage 3b: Secondary | ICD-10-CM | POA: Diagnosis not present

## 2022-04-28 DIAGNOSIS — E7800 Pure hypercholesterolemia, unspecified: Secondary | ICD-10-CM | POA: Diagnosis not present

## 2022-04-28 DIAGNOSIS — J45909 Unspecified asthma, uncomplicated: Secondary | ICD-10-CM | POA: Diagnosis not present

## 2022-04-28 DIAGNOSIS — G4762 Sleep related leg cramps: Secondary | ICD-10-CM | POA: Diagnosis not present

## 2022-04-28 DIAGNOSIS — R600 Localized edema: Secondary | ICD-10-CM | POA: Diagnosis not present

## 2022-04-28 DIAGNOSIS — G8929 Other chronic pain: Secondary | ICD-10-CM | POA: Diagnosis not present

## 2022-04-28 DIAGNOSIS — D509 Iron deficiency anemia, unspecified: Secondary | ICD-10-CM | POA: Diagnosis not present

## 2022-04-28 DIAGNOSIS — G309 Alzheimer's disease, unspecified: Secondary | ICD-10-CM | POA: Diagnosis not present

## 2022-04-28 DIAGNOSIS — M109 Gout, unspecified: Secondary | ICD-10-CM | POA: Diagnosis not present

## 2022-04-28 DIAGNOSIS — E1140 Type 2 diabetes mellitus with diabetic neuropathy, unspecified: Secondary | ICD-10-CM | POA: Diagnosis not present

## 2022-04-28 DIAGNOSIS — N184 Chronic kidney disease, stage 4 (severe): Secondary | ICD-10-CM | POA: Diagnosis not present

## 2022-04-28 DIAGNOSIS — F32A Depression, unspecified: Secondary | ICD-10-CM | POA: Diagnosis not present

## 2022-04-28 DIAGNOSIS — D631 Anemia in chronic kidney disease: Secondary | ICD-10-CM | POA: Diagnosis not present

## 2022-04-28 DIAGNOSIS — G4700 Insomnia, unspecified: Secondary | ICD-10-CM | POA: Diagnosis not present

## 2022-04-28 DIAGNOSIS — G250 Essential tremor: Secondary | ICD-10-CM | POA: Diagnosis not present

## 2022-04-28 DIAGNOSIS — N179 Acute kidney failure, unspecified: Secondary | ICD-10-CM | POA: Diagnosis not present

## 2022-04-28 DIAGNOSIS — K219 Gastro-esophageal reflux disease without esophagitis: Secondary | ICD-10-CM | POA: Diagnosis not present

## 2022-04-28 DIAGNOSIS — I951 Orthostatic hypotension: Secondary | ICD-10-CM | POA: Diagnosis not present

## 2022-04-28 DIAGNOSIS — E559 Vitamin D deficiency, unspecified: Secondary | ICD-10-CM | POA: Diagnosis not present

## 2022-04-28 DIAGNOSIS — I129 Hypertensive chronic kidney disease with stage 1 through stage 4 chronic kidney disease, or unspecified chronic kidney disease: Secondary | ICD-10-CM | POA: Diagnosis not present

## 2022-04-28 HISTORY — DX: Other specified soft tissue disorders: M79.89

## 2022-04-29 DIAGNOSIS — I951 Orthostatic hypotension: Secondary | ICD-10-CM | POA: Diagnosis not present

## 2022-04-29 DIAGNOSIS — E1169 Type 2 diabetes mellitus with other specified complication: Secondary | ICD-10-CM | POA: Diagnosis not present

## 2022-04-29 DIAGNOSIS — G250 Essential tremor: Secondary | ICD-10-CM | POA: Diagnosis not present

## 2022-04-29 DIAGNOSIS — E559 Vitamin D deficiency, unspecified: Secondary | ICD-10-CM | POA: Diagnosis not present

## 2022-04-29 DIAGNOSIS — J45909 Unspecified asthma, uncomplicated: Secondary | ICD-10-CM | POA: Diagnosis not present

## 2022-04-29 DIAGNOSIS — G4700 Insomnia, unspecified: Secondary | ICD-10-CM | POA: Diagnosis not present

## 2022-04-29 DIAGNOSIS — N179 Acute kidney failure, unspecified: Secondary | ICD-10-CM | POA: Diagnosis not present

## 2022-04-29 DIAGNOSIS — N3281 Overactive bladder: Secondary | ICD-10-CM | POA: Diagnosis not present

## 2022-04-29 DIAGNOSIS — E7800 Pure hypercholesterolemia, unspecified: Secondary | ICD-10-CM | POA: Diagnosis not present

## 2022-04-29 DIAGNOSIS — G309 Alzheimer's disease, unspecified: Secondary | ICD-10-CM | POA: Diagnosis not present

## 2022-04-29 DIAGNOSIS — D509 Iron deficiency anemia, unspecified: Secondary | ICD-10-CM | POA: Diagnosis not present

## 2022-04-29 DIAGNOSIS — M549 Dorsalgia, unspecified: Secondary | ICD-10-CM | POA: Diagnosis not present

## 2022-04-29 DIAGNOSIS — G8929 Other chronic pain: Secondary | ICD-10-CM | POA: Diagnosis not present

## 2022-04-29 DIAGNOSIS — G4762 Sleep related leg cramps: Secondary | ICD-10-CM | POA: Diagnosis not present

## 2022-04-29 DIAGNOSIS — N184 Chronic kidney disease, stage 4 (severe): Secondary | ICD-10-CM | POA: Diagnosis not present

## 2022-04-29 DIAGNOSIS — D631 Anemia in chronic kidney disease: Secondary | ICD-10-CM | POA: Diagnosis not present

## 2022-04-29 DIAGNOSIS — K219 Gastro-esophageal reflux disease without esophagitis: Secondary | ICD-10-CM | POA: Diagnosis not present

## 2022-04-29 DIAGNOSIS — G4733 Obstructive sleep apnea (adult) (pediatric): Secondary | ICD-10-CM | POA: Diagnosis not present

## 2022-04-29 DIAGNOSIS — M109 Gout, unspecified: Secondary | ICD-10-CM | POA: Diagnosis not present

## 2022-04-29 DIAGNOSIS — F32A Depression, unspecified: Secondary | ICD-10-CM | POA: Diagnosis not present

## 2022-04-29 DIAGNOSIS — I1310 Hypertensive heart and chronic kidney disease without heart failure, with stage 1 through stage 4 chronic kidney disease, or unspecified chronic kidney disease: Secondary | ICD-10-CM | POA: Diagnosis not present

## 2022-04-29 DIAGNOSIS — E1140 Type 2 diabetes mellitus with diabetic neuropathy, unspecified: Secondary | ICD-10-CM | POA: Diagnosis not present

## 2022-04-29 DIAGNOSIS — E1122 Type 2 diabetes mellitus with diabetic chronic kidney disease: Secondary | ICD-10-CM | POA: Diagnosis not present

## 2022-04-29 DIAGNOSIS — G2581 Restless legs syndrome: Secondary | ICD-10-CM | POA: Diagnosis not present

## 2022-04-30 DIAGNOSIS — G8929 Other chronic pain: Secondary | ICD-10-CM | POA: Diagnosis not present

## 2022-04-30 DIAGNOSIS — J45909 Unspecified asthma, uncomplicated: Secondary | ICD-10-CM | POA: Diagnosis not present

## 2022-04-30 DIAGNOSIS — N184 Chronic kidney disease, stage 4 (severe): Secondary | ICD-10-CM | POA: Diagnosis not present

## 2022-04-30 DIAGNOSIS — M109 Gout, unspecified: Secondary | ICD-10-CM | POA: Diagnosis not present

## 2022-04-30 DIAGNOSIS — E1169 Type 2 diabetes mellitus with other specified complication: Secondary | ICD-10-CM | POA: Diagnosis not present

## 2022-04-30 DIAGNOSIS — I951 Orthostatic hypotension: Secondary | ICD-10-CM | POA: Diagnosis not present

## 2022-04-30 DIAGNOSIS — D631 Anemia in chronic kidney disease: Secondary | ICD-10-CM | POA: Diagnosis not present

## 2022-04-30 DIAGNOSIS — E1140 Type 2 diabetes mellitus with diabetic neuropathy, unspecified: Secondary | ICD-10-CM | POA: Diagnosis not present

## 2022-04-30 DIAGNOSIS — E559 Vitamin D deficiency, unspecified: Secondary | ICD-10-CM | POA: Diagnosis not present

## 2022-04-30 DIAGNOSIS — F32A Depression, unspecified: Secondary | ICD-10-CM | POA: Diagnosis not present

## 2022-04-30 DIAGNOSIS — I1310 Hypertensive heart and chronic kidney disease without heart failure, with stage 1 through stage 4 chronic kidney disease, or unspecified chronic kidney disease: Secondary | ICD-10-CM | POA: Diagnosis not present

## 2022-04-30 DIAGNOSIS — M549 Dorsalgia, unspecified: Secondary | ICD-10-CM | POA: Diagnosis not present

## 2022-04-30 DIAGNOSIS — N179 Acute kidney failure, unspecified: Secondary | ICD-10-CM | POA: Diagnosis not present

## 2022-04-30 DIAGNOSIS — N3281 Overactive bladder: Secondary | ICD-10-CM | POA: Diagnosis not present

## 2022-04-30 DIAGNOSIS — G4700 Insomnia, unspecified: Secondary | ICD-10-CM | POA: Diagnosis not present

## 2022-04-30 DIAGNOSIS — G309 Alzheimer's disease, unspecified: Secondary | ICD-10-CM | POA: Diagnosis not present

## 2022-04-30 DIAGNOSIS — D509 Iron deficiency anemia, unspecified: Secondary | ICD-10-CM | POA: Diagnosis not present

## 2022-04-30 DIAGNOSIS — G250 Essential tremor: Secondary | ICD-10-CM | POA: Diagnosis not present

## 2022-04-30 DIAGNOSIS — K219 Gastro-esophageal reflux disease without esophagitis: Secondary | ICD-10-CM | POA: Diagnosis not present

## 2022-04-30 DIAGNOSIS — E1122 Type 2 diabetes mellitus with diabetic chronic kidney disease: Secondary | ICD-10-CM | POA: Diagnosis not present

## 2022-04-30 DIAGNOSIS — G4762 Sleep related leg cramps: Secondary | ICD-10-CM | POA: Diagnosis not present

## 2022-04-30 DIAGNOSIS — E7800 Pure hypercholesterolemia, unspecified: Secondary | ICD-10-CM | POA: Diagnosis not present

## 2022-04-30 DIAGNOSIS — G2581 Restless legs syndrome: Secondary | ICD-10-CM | POA: Diagnosis not present

## 2022-05-01 DIAGNOSIS — M47812 Spondylosis without myelopathy or radiculopathy, cervical region: Secondary | ICD-10-CM | POA: Diagnosis not present

## 2022-05-04 DIAGNOSIS — N184 Chronic kidney disease, stage 4 (severe): Secondary | ICD-10-CM | POA: Diagnosis not present

## 2022-05-04 DIAGNOSIS — G4700 Insomnia, unspecified: Secondary | ICD-10-CM | POA: Diagnosis not present

## 2022-05-04 DIAGNOSIS — E559 Vitamin D deficiency, unspecified: Secondary | ICD-10-CM | POA: Diagnosis not present

## 2022-05-04 DIAGNOSIS — G4762 Sleep related leg cramps: Secondary | ICD-10-CM | POA: Diagnosis not present

## 2022-05-04 DIAGNOSIS — J45909 Unspecified asthma, uncomplicated: Secondary | ICD-10-CM | POA: Diagnosis not present

## 2022-05-04 DIAGNOSIS — G2581 Restless legs syndrome: Secondary | ICD-10-CM | POA: Diagnosis not present

## 2022-05-04 DIAGNOSIS — E7800 Pure hypercholesterolemia, unspecified: Secondary | ICD-10-CM | POA: Diagnosis not present

## 2022-05-04 DIAGNOSIS — M109 Gout, unspecified: Secondary | ICD-10-CM | POA: Diagnosis not present

## 2022-05-04 DIAGNOSIS — M549 Dorsalgia, unspecified: Secondary | ICD-10-CM | POA: Diagnosis not present

## 2022-05-04 DIAGNOSIS — K219 Gastro-esophageal reflux disease without esophagitis: Secondary | ICD-10-CM | POA: Diagnosis not present

## 2022-05-04 DIAGNOSIS — E1122 Type 2 diabetes mellitus with diabetic chronic kidney disease: Secondary | ICD-10-CM | POA: Diagnosis not present

## 2022-05-04 DIAGNOSIS — D509 Iron deficiency anemia, unspecified: Secondary | ICD-10-CM | POA: Diagnosis not present

## 2022-05-04 DIAGNOSIS — G8929 Other chronic pain: Secondary | ICD-10-CM | POA: Diagnosis not present

## 2022-05-04 DIAGNOSIS — N179 Acute kidney failure, unspecified: Secondary | ICD-10-CM | POA: Diagnosis not present

## 2022-05-04 DIAGNOSIS — N3281 Overactive bladder: Secondary | ICD-10-CM | POA: Diagnosis not present

## 2022-05-04 DIAGNOSIS — G250 Essential tremor: Secondary | ICD-10-CM | POA: Diagnosis not present

## 2022-05-04 DIAGNOSIS — E1140 Type 2 diabetes mellitus with diabetic neuropathy, unspecified: Secondary | ICD-10-CM | POA: Diagnosis not present

## 2022-05-04 DIAGNOSIS — I951 Orthostatic hypotension: Secondary | ICD-10-CM | POA: Diagnosis not present

## 2022-05-04 DIAGNOSIS — E1169 Type 2 diabetes mellitus with other specified complication: Secondary | ICD-10-CM | POA: Diagnosis not present

## 2022-05-04 DIAGNOSIS — I1310 Hypertensive heart and chronic kidney disease without heart failure, with stage 1 through stage 4 chronic kidney disease, or unspecified chronic kidney disease: Secondary | ICD-10-CM | POA: Diagnosis not present

## 2022-05-04 DIAGNOSIS — D631 Anemia in chronic kidney disease: Secondary | ICD-10-CM | POA: Diagnosis not present

## 2022-05-04 DIAGNOSIS — F32A Depression, unspecified: Secondary | ICD-10-CM | POA: Diagnosis not present

## 2022-05-04 DIAGNOSIS — G309 Alzheimer's disease, unspecified: Secondary | ICD-10-CM | POA: Diagnosis not present

## 2022-05-05 DIAGNOSIS — G309 Alzheimer's disease, unspecified: Secondary | ICD-10-CM | POA: Diagnosis not present

## 2022-05-05 DIAGNOSIS — E7800 Pure hypercholesterolemia, unspecified: Secondary | ICD-10-CM | POA: Diagnosis not present

## 2022-05-05 DIAGNOSIS — G4762 Sleep related leg cramps: Secondary | ICD-10-CM | POA: Diagnosis not present

## 2022-05-05 DIAGNOSIS — N3281 Overactive bladder: Secondary | ICD-10-CM | POA: Diagnosis not present

## 2022-05-05 DIAGNOSIS — D631 Anemia in chronic kidney disease: Secondary | ICD-10-CM | POA: Diagnosis not present

## 2022-05-05 DIAGNOSIS — E1169 Type 2 diabetes mellitus with other specified complication: Secondary | ICD-10-CM | POA: Diagnosis not present

## 2022-05-05 DIAGNOSIS — J45909 Unspecified asthma, uncomplicated: Secondary | ICD-10-CM | POA: Diagnosis not present

## 2022-05-05 DIAGNOSIS — E1140 Type 2 diabetes mellitus with diabetic neuropathy, unspecified: Secondary | ICD-10-CM | POA: Diagnosis not present

## 2022-05-05 DIAGNOSIS — K219 Gastro-esophageal reflux disease without esophagitis: Secondary | ICD-10-CM | POA: Diagnosis not present

## 2022-05-05 DIAGNOSIS — M549 Dorsalgia, unspecified: Secondary | ICD-10-CM | POA: Diagnosis not present

## 2022-05-05 DIAGNOSIS — E559 Vitamin D deficiency, unspecified: Secondary | ICD-10-CM | POA: Diagnosis not present

## 2022-05-05 DIAGNOSIS — I951 Orthostatic hypotension: Secondary | ICD-10-CM | POA: Diagnosis not present

## 2022-05-05 DIAGNOSIS — I1310 Hypertensive heart and chronic kidney disease without heart failure, with stage 1 through stage 4 chronic kidney disease, or unspecified chronic kidney disease: Secondary | ICD-10-CM | POA: Diagnosis not present

## 2022-05-05 DIAGNOSIS — G8929 Other chronic pain: Secondary | ICD-10-CM | POA: Diagnosis not present

## 2022-05-05 DIAGNOSIS — N184 Chronic kidney disease, stage 4 (severe): Secondary | ICD-10-CM | POA: Diagnosis not present

## 2022-05-05 DIAGNOSIS — G2581 Restless legs syndrome: Secondary | ICD-10-CM | POA: Diagnosis not present

## 2022-05-05 DIAGNOSIS — G250 Essential tremor: Secondary | ICD-10-CM | POA: Diagnosis not present

## 2022-05-05 DIAGNOSIS — D509 Iron deficiency anemia, unspecified: Secondary | ICD-10-CM | POA: Diagnosis not present

## 2022-05-05 DIAGNOSIS — G4700 Insomnia, unspecified: Secondary | ICD-10-CM | POA: Diagnosis not present

## 2022-05-05 DIAGNOSIS — M109 Gout, unspecified: Secondary | ICD-10-CM | POA: Diagnosis not present

## 2022-05-05 DIAGNOSIS — E1122 Type 2 diabetes mellitus with diabetic chronic kidney disease: Secondary | ICD-10-CM | POA: Diagnosis not present

## 2022-05-05 DIAGNOSIS — F32A Depression, unspecified: Secondary | ICD-10-CM | POA: Diagnosis not present

## 2022-05-05 DIAGNOSIS — N179 Acute kidney failure, unspecified: Secondary | ICD-10-CM | POA: Diagnosis not present

## 2022-05-06 DIAGNOSIS — Z79899 Other long term (current) drug therapy: Secondary | ICD-10-CM | POA: Diagnosis not present

## 2022-05-06 DIAGNOSIS — M79603 Pain in arm, unspecified: Secondary | ICD-10-CM | POA: Diagnosis not present

## 2022-05-06 DIAGNOSIS — E559 Vitamin D deficiency, unspecified: Secondary | ICD-10-CM | POA: Diagnosis not present

## 2022-05-06 DIAGNOSIS — Z01818 Encounter for other preprocedural examination: Secondary | ICD-10-CM | POA: Diagnosis not present

## 2022-05-07 DIAGNOSIS — E7800 Pure hypercholesterolemia, unspecified: Secondary | ICD-10-CM | POA: Diagnosis not present

## 2022-05-07 DIAGNOSIS — E1140 Type 2 diabetes mellitus with diabetic neuropathy, unspecified: Secondary | ICD-10-CM | POA: Diagnosis not present

## 2022-05-07 DIAGNOSIS — I1310 Hypertensive heart and chronic kidney disease without heart failure, with stage 1 through stage 4 chronic kidney disease, or unspecified chronic kidney disease: Secondary | ICD-10-CM | POA: Diagnosis not present

## 2022-05-07 DIAGNOSIS — M549 Dorsalgia, unspecified: Secondary | ICD-10-CM | POA: Diagnosis not present

## 2022-05-07 DIAGNOSIS — J45909 Unspecified asthma, uncomplicated: Secondary | ICD-10-CM | POA: Diagnosis not present

## 2022-05-07 DIAGNOSIS — G309 Alzheimer's disease, unspecified: Secondary | ICD-10-CM | POA: Diagnosis not present

## 2022-05-07 DIAGNOSIS — E559 Vitamin D deficiency, unspecified: Secondary | ICD-10-CM | POA: Diagnosis not present

## 2022-05-07 DIAGNOSIS — F32A Depression, unspecified: Secondary | ICD-10-CM | POA: Diagnosis not present

## 2022-05-07 DIAGNOSIS — M109 Gout, unspecified: Secondary | ICD-10-CM | POA: Diagnosis not present

## 2022-05-07 DIAGNOSIS — K219 Gastro-esophageal reflux disease without esophagitis: Secondary | ICD-10-CM | POA: Diagnosis not present

## 2022-05-07 DIAGNOSIS — E1122 Type 2 diabetes mellitus with diabetic chronic kidney disease: Secondary | ICD-10-CM | POA: Diagnosis not present

## 2022-05-07 DIAGNOSIS — G250 Essential tremor: Secondary | ICD-10-CM | POA: Diagnosis not present

## 2022-05-07 DIAGNOSIS — G8929 Other chronic pain: Secondary | ICD-10-CM | POA: Diagnosis not present

## 2022-05-07 DIAGNOSIS — N184 Chronic kidney disease, stage 4 (severe): Secondary | ICD-10-CM | POA: Diagnosis not present

## 2022-05-07 DIAGNOSIS — I951 Orthostatic hypotension: Secondary | ICD-10-CM | POA: Diagnosis not present

## 2022-05-07 DIAGNOSIS — G4700 Insomnia, unspecified: Secondary | ICD-10-CM | POA: Diagnosis not present

## 2022-05-07 DIAGNOSIS — N179 Acute kidney failure, unspecified: Secondary | ICD-10-CM | POA: Diagnosis not present

## 2022-05-07 DIAGNOSIS — E1169 Type 2 diabetes mellitus with other specified complication: Secondary | ICD-10-CM | POA: Diagnosis not present

## 2022-05-07 DIAGNOSIS — G2581 Restless legs syndrome: Secondary | ICD-10-CM | POA: Diagnosis not present

## 2022-05-07 DIAGNOSIS — D509 Iron deficiency anemia, unspecified: Secondary | ICD-10-CM | POA: Diagnosis not present

## 2022-05-07 DIAGNOSIS — G4762 Sleep related leg cramps: Secondary | ICD-10-CM | POA: Diagnosis not present

## 2022-05-07 DIAGNOSIS — N3281 Overactive bladder: Secondary | ICD-10-CM | POA: Diagnosis not present

## 2022-05-07 DIAGNOSIS — D631 Anemia in chronic kidney disease: Secondary | ICD-10-CM | POA: Diagnosis not present

## 2022-05-08 DIAGNOSIS — E1169 Type 2 diabetes mellitus with other specified complication: Secondary | ICD-10-CM | POA: Diagnosis not present

## 2022-05-08 DIAGNOSIS — F32A Depression, unspecified: Secondary | ICD-10-CM | POA: Diagnosis not present

## 2022-05-08 DIAGNOSIS — G8929 Other chronic pain: Secondary | ICD-10-CM | POA: Diagnosis not present

## 2022-05-08 DIAGNOSIS — M109 Gout, unspecified: Secondary | ICD-10-CM | POA: Diagnosis not present

## 2022-05-08 DIAGNOSIS — N179 Acute kidney failure, unspecified: Secondary | ICD-10-CM | POA: Diagnosis not present

## 2022-05-08 DIAGNOSIS — N184 Chronic kidney disease, stage 4 (severe): Secondary | ICD-10-CM | POA: Diagnosis not present

## 2022-05-08 DIAGNOSIS — G4700 Insomnia, unspecified: Secondary | ICD-10-CM | POA: Diagnosis not present

## 2022-05-08 DIAGNOSIS — E1140 Type 2 diabetes mellitus with diabetic neuropathy, unspecified: Secondary | ICD-10-CM | POA: Diagnosis not present

## 2022-05-08 DIAGNOSIS — I951 Orthostatic hypotension: Secondary | ICD-10-CM | POA: Diagnosis not present

## 2022-05-08 DIAGNOSIS — D631 Anemia in chronic kidney disease: Secondary | ICD-10-CM | POA: Diagnosis not present

## 2022-05-08 DIAGNOSIS — D509 Iron deficiency anemia, unspecified: Secondary | ICD-10-CM | POA: Diagnosis not present

## 2022-05-08 DIAGNOSIS — G250 Essential tremor: Secondary | ICD-10-CM | POA: Diagnosis not present

## 2022-05-08 DIAGNOSIS — G4762 Sleep related leg cramps: Secondary | ICD-10-CM | POA: Diagnosis not present

## 2022-05-08 DIAGNOSIS — K219 Gastro-esophageal reflux disease without esophagitis: Secondary | ICD-10-CM | POA: Diagnosis not present

## 2022-05-08 DIAGNOSIS — E1122 Type 2 diabetes mellitus with diabetic chronic kidney disease: Secondary | ICD-10-CM | POA: Diagnosis not present

## 2022-05-08 DIAGNOSIS — E7800 Pure hypercholesterolemia, unspecified: Secondary | ICD-10-CM | POA: Diagnosis not present

## 2022-05-08 DIAGNOSIS — E559 Vitamin D deficiency, unspecified: Secondary | ICD-10-CM | POA: Diagnosis not present

## 2022-05-08 DIAGNOSIS — M549 Dorsalgia, unspecified: Secondary | ICD-10-CM | POA: Diagnosis not present

## 2022-05-08 DIAGNOSIS — G309 Alzheimer's disease, unspecified: Secondary | ICD-10-CM | POA: Diagnosis not present

## 2022-05-08 DIAGNOSIS — J45909 Unspecified asthma, uncomplicated: Secondary | ICD-10-CM | POA: Diagnosis not present

## 2022-05-08 DIAGNOSIS — I1310 Hypertensive heart and chronic kidney disease without heart failure, with stage 1 through stage 4 chronic kidney disease, or unspecified chronic kidney disease: Secondary | ICD-10-CM | POA: Diagnosis not present

## 2022-05-08 DIAGNOSIS — G2581 Restless legs syndrome: Secondary | ICD-10-CM | POA: Diagnosis not present

## 2022-05-08 DIAGNOSIS — N3281 Overactive bladder: Secondary | ICD-10-CM | POA: Diagnosis not present

## 2022-05-11 DIAGNOSIS — I1310 Hypertensive heart and chronic kidney disease without heart failure, with stage 1 through stage 4 chronic kidney disease, or unspecified chronic kidney disease: Secondary | ICD-10-CM | POA: Diagnosis not present

## 2022-05-11 DIAGNOSIS — M4302 Spondylolysis, cervical region: Secondary | ICD-10-CM | POA: Diagnosis not present

## 2022-05-11 DIAGNOSIS — R600 Localized edema: Secondary | ICD-10-CM | POA: Diagnosis not present

## 2022-05-11 DIAGNOSIS — K219 Gastro-esophageal reflux disease without esophagitis: Secondary | ICD-10-CM | POA: Diagnosis not present

## 2022-05-11 DIAGNOSIS — N3281 Overactive bladder: Secondary | ICD-10-CM | POA: Diagnosis not present

## 2022-05-11 DIAGNOSIS — E7800 Pure hypercholesterolemia, unspecified: Secondary | ICD-10-CM | POA: Diagnosis not present

## 2022-05-11 DIAGNOSIS — G2581 Restless legs syndrome: Secondary | ICD-10-CM | POA: Diagnosis not present

## 2022-05-11 DIAGNOSIS — G250 Essential tremor: Secondary | ICD-10-CM | POA: Diagnosis not present

## 2022-05-11 DIAGNOSIS — I951 Orthostatic hypotension: Secondary | ICD-10-CM | POA: Diagnosis not present

## 2022-05-11 DIAGNOSIS — G8929 Other chronic pain: Secondary | ICD-10-CM | POA: Diagnosis not present

## 2022-05-11 DIAGNOSIS — G4762 Sleep related leg cramps: Secondary | ICD-10-CM | POA: Diagnosis not present

## 2022-05-11 DIAGNOSIS — D509 Iron deficiency anemia, unspecified: Secondary | ICD-10-CM | POA: Diagnosis not present

## 2022-05-11 DIAGNOSIS — E559 Vitamin D deficiency, unspecified: Secondary | ICD-10-CM | POA: Diagnosis not present

## 2022-05-11 DIAGNOSIS — N184 Chronic kidney disease, stage 4 (severe): Secondary | ICD-10-CM | POA: Diagnosis not present

## 2022-05-11 DIAGNOSIS — F32A Depression, unspecified: Secondary | ICD-10-CM | POA: Diagnosis not present

## 2022-05-11 DIAGNOSIS — J45909 Unspecified asthma, uncomplicated: Secondary | ICD-10-CM | POA: Diagnosis not present

## 2022-05-11 DIAGNOSIS — Z01818 Encounter for other preprocedural examination: Secondary | ICD-10-CM | POA: Diagnosis not present

## 2022-05-11 DIAGNOSIS — E1122 Type 2 diabetes mellitus with diabetic chronic kidney disease: Secondary | ICD-10-CM | POA: Diagnosis not present

## 2022-05-11 DIAGNOSIS — G309 Alzheimer's disease, unspecified: Secondary | ICD-10-CM | POA: Diagnosis not present

## 2022-05-11 DIAGNOSIS — E1140 Type 2 diabetes mellitus with diabetic neuropathy, unspecified: Secondary | ICD-10-CM | POA: Diagnosis not present

## 2022-05-11 DIAGNOSIS — M549 Dorsalgia, unspecified: Secondary | ICD-10-CM | POA: Diagnosis not present

## 2022-05-11 DIAGNOSIS — G4700 Insomnia, unspecified: Secondary | ICD-10-CM | POA: Diagnosis not present

## 2022-05-11 DIAGNOSIS — E1169 Type 2 diabetes mellitus with other specified complication: Secondary | ICD-10-CM | POA: Diagnosis not present

## 2022-05-11 DIAGNOSIS — N179 Acute kidney failure, unspecified: Secondary | ICD-10-CM | POA: Diagnosis not present

## 2022-05-11 DIAGNOSIS — D631 Anemia in chronic kidney disease: Secondary | ICD-10-CM | POA: Diagnosis not present

## 2022-05-11 DIAGNOSIS — I129 Hypertensive chronic kidney disease with stage 1 through stage 4 chronic kidney disease, or unspecified chronic kidney disease: Secondary | ICD-10-CM | POA: Diagnosis not present

## 2022-05-11 DIAGNOSIS — N183 Chronic kidney disease, stage 3 unspecified: Secondary | ICD-10-CM | POA: Diagnosis not present

## 2022-05-11 DIAGNOSIS — M109 Gout, unspecified: Secondary | ICD-10-CM | POA: Diagnosis not present

## 2022-05-13 DIAGNOSIS — E1140 Type 2 diabetes mellitus with diabetic neuropathy, unspecified: Secondary | ICD-10-CM | POA: Diagnosis not present

## 2022-05-13 DIAGNOSIS — K219 Gastro-esophageal reflux disease without esophagitis: Secondary | ICD-10-CM | POA: Diagnosis not present

## 2022-05-13 DIAGNOSIS — J45909 Unspecified asthma, uncomplicated: Secondary | ICD-10-CM | POA: Diagnosis not present

## 2022-05-13 DIAGNOSIS — E559 Vitamin D deficiency, unspecified: Secondary | ICD-10-CM | POA: Diagnosis not present

## 2022-05-13 DIAGNOSIS — N3281 Overactive bladder: Secondary | ICD-10-CM | POA: Diagnosis not present

## 2022-05-13 DIAGNOSIS — D509 Iron deficiency anemia, unspecified: Secondary | ICD-10-CM | POA: Diagnosis not present

## 2022-05-13 DIAGNOSIS — N184 Chronic kidney disease, stage 4 (severe): Secondary | ICD-10-CM | POA: Diagnosis not present

## 2022-05-13 DIAGNOSIS — G309 Alzheimer's disease, unspecified: Secondary | ICD-10-CM | POA: Diagnosis not present

## 2022-05-13 DIAGNOSIS — E1122 Type 2 diabetes mellitus with diabetic chronic kidney disease: Secondary | ICD-10-CM | POA: Diagnosis not present

## 2022-05-13 DIAGNOSIS — G4700 Insomnia, unspecified: Secondary | ICD-10-CM | POA: Diagnosis not present

## 2022-05-13 DIAGNOSIS — I1310 Hypertensive heart and chronic kidney disease without heart failure, with stage 1 through stage 4 chronic kidney disease, or unspecified chronic kidney disease: Secondary | ICD-10-CM | POA: Diagnosis not present

## 2022-05-13 DIAGNOSIS — F32A Depression, unspecified: Secondary | ICD-10-CM | POA: Diagnosis not present

## 2022-05-13 DIAGNOSIS — E7800 Pure hypercholesterolemia, unspecified: Secondary | ICD-10-CM | POA: Diagnosis not present

## 2022-05-13 DIAGNOSIS — G4762 Sleep related leg cramps: Secondary | ICD-10-CM | POA: Diagnosis not present

## 2022-05-13 DIAGNOSIS — I951 Orthostatic hypotension: Secondary | ICD-10-CM | POA: Diagnosis not present

## 2022-05-13 DIAGNOSIS — D631 Anemia in chronic kidney disease: Secondary | ICD-10-CM | POA: Diagnosis not present

## 2022-05-13 DIAGNOSIS — G250 Essential tremor: Secondary | ICD-10-CM | POA: Diagnosis not present

## 2022-05-13 DIAGNOSIS — M109 Gout, unspecified: Secondary | ICD-10-CM | POA: Diagnosis not present

## 2022-05-13 DIAGNOSIS — G2581 Restless legs syndrome: Secondary | ICD-10-CM | POA: Diagnosis not present

## 2022-05-13 DIAGNOSIS — E1169 Type 2 diabetes mellitus with other specified complication: Secondary | ICD-10-CM | POA: Diagnosis not present

## 2022-05-13 DIAGNOSIS — G8929 Other chronic pain: Secondary | ICD-10-CM | POA: Diagnosis not present

## 2022-05-13 DIAGNOSIS — N179 Acute kidney failure, unspecified: Secondary | ICD-10-CM | POA: Diagnosis not present

## 2022-05-13 DIAGNOSIS — M549 Dorsalgia, unspecified: Secondary | ICD-10-CM | POA: Diagnosis not present

## 2022-05-14 DIAGNOSIS — G4700 Insomnia, unspecified: Secondary | ICD-10-CM | POA: Diagnosis not present

## 2022-05-14 DIAGNOSIS — E559 Vitamin D deficiency, unspecified: Secondary | ICD-10-CM | POA: Diagnosis not present

## 2022-05-14 DIAGNOSIS — I1310 Hypertensive heart and chronic kidney disease without heart failure, with stage 1 through stage 4 chronic kidney disease, or unspecified chronic kidney disease: Secondary | ICD-10-CM | POA: Diagnosis not present

## 2022-05-14 DIAGNOSIS — E1169 Type 2 diabetes mellitus with other specified complication: Secondary | ICD-10-CM | POA: Diagnosis not present

## 2022-05-14 DIAGNOSIS — F32A Depression, unspecified: Secondary | ICD-10-CM | POA: Diagnosis not present

## 2022-05-14 DIAGNOSIS — K219 Gastro-esophageal reflux disease without esophagitis: Secondary | ICD-10-CM | POA: Diagnosis not present

## 2022-05-14 DIAGNOSIS — N184 Chronic kidney disease, stage 4 (severe): Secondary | ICD-10-CM | POA: Diagnosis not present

## 2022-05-14 DIAGNOSIS — G250 Essential tremor: Secondary | ICD-10-CM | POA: Diagnosis not present

## 2022-05-14 DIAGNOSIS — D509 Iron deficiency anemia, unspecified: Secondary | ICD-10-CM | POA: Diagnosis not present

## 2022-05-14 DIAGNOSIS — N3281 Overactive bladder: Secondary | ICD-10-CM | POA: Diagnosis not present

## 2022-05-14 DIAGNOSIS — E7800 Pure hypercholesterolemia, unspecified: Secondary | ICD-10-CM | POA: Diagnosis not present

## 2022-05-14 DIAGNOSIS — N179 Acute kidney failure, unspecified: Secondary | ICD-10-CM | POA: Diagnosis not present

## 2022-05-14 DIAGNOSIS — M109 Gout, unspecified: Secondary | ICD-10-CM | POA: Diagnosis not present

## 2022-05-14 DIAGNOSIS — D631 Anemia in chronic kidney disease: Secondary | ICD-10-CM | POA: Diagnosis not present

## 2022-05-14 DIAGNOSIS — I951 Orthostatic hypotension: Secondary | ICD-10-CM | POA: Diagnosis not present

## 2022-05-14 DIAGNOSIS — G309 Alzheimer's disease, unspecified: Secondary | ICD-10-CM | POA: Diagnosis not present

## 2022-05-14 DIAGNOSIS — G8929 Other chronic pain: Secondary | ICD-10-CM | POA: Diagnosis not present

## 2022-05-14 DIAGNOSIS — E1140 Type 2 diabetes mellitus with diabetic neuropathy, unspecified: Secondary | ICD-10-CM | POA: Diagnosis not present

## 2022-05-14 DIAGNOSIS — J45909 Unspecified asthma, uncomplicated: Secondary | ICD-10-CM | POA: Diagnosis not present

## 2022-05-14 DIAGNOSIS — E876 Hypokalemia: Secondary | ICD-10-CM | POA: Diagnosis not present

## 2022-05-14 DIAGNOSIS — E1122 Type 2 diabetes mellitus with diabetic chronic kidney disease: Secondary | ICD-10-CM | POA: Diagnosis not present

## 2022-05-14 DIAGNOSIS — M549 Dorsalgia, unspecified: Secondary | ICD-10-CM | POA: Diagnosis not present

## 2022-05-14 DIAGNOSIS — G2581 Restless legs syndrome: Secondary | ICD-10-CM | POA: Diagnosis not present

## 2022-05-14 DIAGNOSIS — G4762 Sleep related leg cramps: Secondary | ICD-10-CM | POA: Diagnosis not present

## 2022-05-15 DIAGNOSIS — I951 Orthostatic hypotension: Secondary | ICD-10-CM | POA: Diagnosis not present

## 2022-05-15 DIAGNOSIS — K219 Gastro-esophageal reflux disease without esophagitis: Secondary | ICD-10-CM | POA: Diagnosis not present

## 2022-05-15 DIAGNOSIS — M109 Gout, unspecified: Secondary | ICD-10-CM | POA: Diagnosis not present

## 2022-05-15 DIAGNOSIS — F32A Depression, unspecified: Secondary | ICD-10-CM | POA: Diagnosis not present

## 2022-05-15 DIAGNOSIS — G4762 Sleep related leg cramps: Secondary | ICD-10-CM | POA: Diagnosis not present

## 2022-05-15 DIAGNOSIS — G8929 Other chronic pain: Secondary | ICD-10-CM | POA: Diagnosis not present

## 2022-05-15 DIAGNOSIS — N184 Chronic kidney disease, stage 4 (severe): Secondary | ICD-10-CM | POA: Diagnosis not present

## 2022-05-15 DIAGNOSIS — E1140 Type 2 diabetes mellitus with diabetic neuropathy, unspecified: Secondary | ICD-10-CM | POA: Diagnosis not present

## 2022-05-15 DIAGNOSIS — E1122 Type 2 diabetes mellitus with diabetic chronic kidney disease: Secondary | ICD-10-CM | POA: Diagnosis not present

## 2022-05-15 DIAGNOSIS — J45909 Unspecified asthma, uncomplicated: Secondary | ICD-10-CM | POA: Diagnosis not present

## 2022-05-15 DIAGNOSIS — E1169 Type 2 diabetes mellitus with other specified complication: Secondary | ICD-10-CM | POA: Diagnosis not present

## 2022-05-15 DIAGNOSIS — N3281 Overactive bladder: Secondary | ICD-10-CM | POA: Diagnosis not present

## 2022-05-15 DIAGNOSIS — E559 Vitamin D deficiency, unspecified: Secondary | ICD-10-CM | POA: Diagnosis not present

## 2022-05-15 DIAGNOSIS — I1310 Hypertensive heart and chronic kidney disease without heart failure, with stage 1 through stage 4 chronic kidney disease, or unspecified chronic kidney disease: Secondary | ICD-10-CM | POA: Diagnosis not present

## 2022-05-15 DIAGNOSIS — G2581 Restless legs syndrome: Secondary | ICD-10-CM | POA: Diagnosis not present

## 2022-05-15 DIAGNOSIS — G309 Alzheimer's disease, unspecified: Secondary | ICD-10-CM | POA: Diagnosis not present

## 2022-05-15 DIAGNOSIS — D631 Anemia in chronic kidney disease: Secondary | ICD-10-CM | POA: Diagnosis not present

## 2022-05-15 DIAGNOSIS — G4700 Insomnia, unspecified: Secondary | ICD-10-CM | POA: Diagnosis not present

## 2022-05-15 DIAGNOSIS — G250 Essential tremor: Secondary | ICD-10-CM | POA: Diagnosis not present

## 2022-05-15 DIAGNOSIS — E7800 Pure hypercholesterolemia, unspecified: Secondary | ICD-10-CM | POA: Diagnosis not present

## 2022-05-15 DIAGNOSIS — N179 Acute kidney failure, unspecified: Secondary | ICD-10-CM | POA: Diagnosis not present

## 2022-05-15 DIAGNOSIS — M549 Dorsalgia, unspecified: Secondary | ICD-10-CM | POA: Diagnosis not present

## 2022-05-15 DIAGNOSIS — D509 Iron deficiency anemia, unspecified: Secondary | ICD-10-CM | POA: Diagnosis not present

## 2022-05-18 DIAGNOSIS — M109 Gout, unspecified: Secondary | ICD-10-CM | POA: Diagnosis not present

## 2022-05-18 DIAGNOSIS — K219 Gastro-esophageal reflux disease without esophagitis: Secondary | ICD-10-CM | POA: Diagnosis not present

## 2022-05-18 DIAGNOSIS — G4762 Sleep related leg cramps: Secondary | ICD-10-CM | POA: Diagnosis not present

## 2022-05-18 DIAGNOSIS — M549 Dorsalgia, unspecified: Secondary | ICD-10-CM | POA: Diagnosis not present

## 2022-05-18 DIAGNOSIS — G250 Essential tremor: Secondary | ICD-10-CM | POA: Diagnosis not present

## 2022-05-18 DIAGNOSIS — E1169 Type 2 diabetes mellitus with other specified complication: Secondary | ICD-10-CM | POA: Diagnosis not present

## 2022-05-18 DIAGNOSIS — G2581 Restless legs syndrome: Secondary | ICD-10-CM | POA: Diagnosis not present

## 2022-05-18 DIAGNOSIS — I951 Orthostatic hypotension: Secondary | ICD-10-CM | POA: Diagnosis not present

## 2022-05-18 DIAGNOSIS — E559 Vitamin D deficiency, unspecified: Secondary | ICD-10-CM | POA: Diagnosis not present

## 2022-05-18 DIAGNOSIS — N3281 Overactive bladder: Secondary | ICD-10-CM | POA: Diagnosis not present

## 2022-05-18 DIAGNOSIS — J45909 Unspecified asthma, uncomplicated: Secondary | ICD-10-CM | POA: Diagnosis not present

## 2022-05-18 DIAGNOSIS — I1310 Hypertensive heart and chronic kidney disease without heart failure, with stage 1 through stage 4 chronic kidney disease, or unspecified chronic kidney disease: Secondary | ICD-10-CM | POA: Diagnosis not present

## 2022-05-18 DIAGNOSIS — G309 Alzheimer's disease, unspecified: Secondary | ICD-10-CM | POA: Diagnosis not present

## 2022-05-18 DIAGNOSIS — G8929 Other chronic pain: Secondary | ICD-10-CM | POA: Diagnosis not present

## 2022-05-18 DIAGNOSIS — E1140 Type 2 diabetes mellitus with diabetic neuropathy, unspecified: Secondary | ICD-10-CM | POA: Diagnosis not present

## 2022-05-18 DIAGNOSIS — D509 Iron deficiency anemia, unspecified: Secondary | ICD-10-CM | POA: Diagnosis not present

## 2022-05-18 DIAGNOSIS — N179 Acute kidney failure, unspecified: Secondary | ICD-10-CM | POA: Diagnosis not present

## 2022-05-18 DIAGNOSIS — G4700 Insomnia, unspecified: Secondary | ICD-10-CM | POA: Diagnosis not present

## 2022-05-18 DIAGNOSIS — E1122 Type 2 diabetes mellitus with diabetic chronic kidney disease: Secondary | ICD-10-CM | POA: Diagnosis not present

## 2022-05-18 DIAGNOSIS — D631 Anemia in chronic kidney disease: Secondary | ICD-10-CM | POA: Diagnosis not present

## 2022-05-18 DIAGNOSIS — E7800 Pure hypercholesterolemia, unspecified: Secondary | ICD-10-CM | POA: Diagnosis not present

## 2022-05-18 DIAGNOSIS — F32A Depression, unspecified: Secondary | ICD-10-CM | POA: Diagnosis not present

## 2022-05-18 DIAGNOSIS — N184 Chronic kidney disease, stage 4 (severe): Secondary | ICD-10-CM | POA: Diagnosis not present

## 2022-05-19 DIAGNOSIS — G8929 Other chronic pain: Secondary | ICD-10-CM | POA: Diagnosis not present

## 2022-05-19 DIAGNOSIS — G4700 Insomnia, unspecified: Secondary | ICD-10-CM | POA: Diagnosis not present

## 2022-05-19 DIAGNOSIS — E1169 Type 2 diabetes mellitus with other specified complication: Secondary | ICD-10-CM | POA: Diagnosis not present

## 2022-05-19 DIAGNOSIS — G250 Essential tremor: Secondary | ICD-10-CM | POA: Diagnosis not present

## 2022-05-19 DIAGNOSIS — N179 Acute kidney failure, unspecified: Secondary | ICD-10-CM | POA: Diagnosis not present

## 2022-05-19 DIAGNOSIS — J45909 Unspecified asthma, uncomplicated: Secondary | ICD-10-CM | POA: Diagnosis not present

## 2022-05-19 DIAGNOSIS — G309 Alzheimer's disease, unspecified: Secondary | ICD-10-CM | POA: Diagnosis not present

## 2022-05-19 DIAGNOSIS — I951 Orthostatic hypotension: Secondary | ICD-10-CM | POA: Diagnosis not present

## 2022-05-19 DIAGNOSIS — F32A Depression, unspecified: Secondary | ICD-10-CM | POA: Diagnosis not present

## 2022-05-19 DIAGNOSIS — M109 Gout, unspecified: Secondary | ICD-10-CM | POA: Diagnosis not present

## 2022-05-19 DIAGNOSIS — E559 Vitamin D deficiency, unspecified: Secondary | ICD-10-CM | POA: Diagnosis not present

## 2022-05-19 DIAGNOSIS — N184 Chronic kidney disease, stage 4 (severe): Secondary | ICD-10-CM | POA: Diagnosis not present

## 2022-05-19 DIAGNOSIS — D509 Iron deficiency anemia, unspecified: Secondary | ICD-10-CM | POA: Diagnosis not present

## 2022-05-19 DIAGNOSIS — E1140 Type 2 diabetes mellitus with diabetic neuropathy, unspecified: Secondary | ICD-10-CM | POA: Diagnosis not present

## 2022-05-19 DIAGNOSIS — G4762 Sleep related leg cramps: Secondary | ICD-10-CM | POA: Diagnosis not present

## 2022-05-19 DIAGNOSIS — G2581 Restless legs syndrome: Secondary | ICD-10-CM | POA: Diagnosis not present

## 2022-05-19 DIAGNOSIS — D631 Anemia in chronic kidney disease: Secondary | ICD-10-CM | POA: Diagnosis not present

## 2022-05-19 DIAGNOSIS — K219 Gastro-esophageal reflux disease without esophagitis: Secondary | ICD-10-CM | POA: Diagnosis not present

## 2022-05-19 DIAGNOSIS — N3281 Overactive bladder: Secondary | ICD-10-CM | POA: Diagnosis not present

## 2022-05-19 DIAGNOSIS — E7800 Pure hypercholesterolemia, unspecified: Secondary | ICD-10-CM | POA: Diagnosis not present

## 2022-05-19 DIAGNOSIS — M549 Dorsalgia, unspecified: Secondary | ICD-10-CM | POA: Diagnosis not present

## 2022-05-19 DIAGNOSIS — I1310 Hypertensive heart and chronic kidney disease without heart failure, with stage 1 through stage 4 chronic kidney disease, or unspecified chronic kidney disease: Secondary | ICD-10-CM | POA: Diagnosis not present

## 2022-05-19 DIAGNOSIS — E1122 Type 2 diabetes mellitus with diabetic chronic kidney disease: Secondary | ICD-10-CM | POA: Diagnosis not present

## 2022-05-21 DIAGNOSIS — G2581 Restless legs syndrome: Secondary | ICD-10-CM | POA: Diagnosis not present

## 2022-05-21 DIAGNOSIS — N179 Acute kidney failure, unspecified: Secondary | ICD-10-CM | POA: Diagnosis not present

## 2022-05-21 DIAGNOSIS — E1140 Type 2 diabetes mellitus with diabetic neuropathy, unspecified: Secondary | ICD-10-CM | POA: Diagnosis not present

## 2022-05-21 DIAGNOSIS — E7800 Pure hypercholesterolemia, unspecified: Secondary | ICD-10-CM | POA: Diagnosis not present

## 2022-05-21 DIAGNOSIS — I951 Orthostatic hypotension: Secondary | ICD-10-CM | POA: Diagnosis not present

## 2022-05-21 DIAGNOSIS — G4762 Sleep related leg cramps: Secondary | ICD-10-CM | POA: Diagnosis not present

## 2022-05-21 DIAGNOSIS — D631 Anemia in chronic kidney disease: Secondary | ICD-10-CM | POA: Diagnosis not present

## 2022-05-21 DIAGNOSIS — E559 Vitamin D deficiency, unspecified: Secondary | ICD-10-CM | POA: Diagnosis not present

## 2022-05-21 DIAGNOSIS — E1122 Type 2 diabetes mellitus with diabetic chronic kidney disease: Secondary | ICD-10-CM | POA: Diagnosis not present

## 2022-05-21 DIAGNOSIS — N3281 Overactive bladder: Secondary | ICD-10-CM | POA: Diagnosis not present

## 2022-05-21 DIAGNOSIS — G309 Alzheimer's disease, unspecified: Secondary | ICD-10-CM | POA: Diagnosis not present

## 2022-05-21 DIAGNOSIS — G8929 Other chronic pain: Secondary | ICD-10-CM | POA: Diagnosis not present

## 2022-05-21 DIAGNOSIS — G4700 Insomnia, unspecified: Secondary | ICD-10-CM | POA: Diagnosis not present

## 2022-05-21 DIAGNOSIS — F32A Depression, unspecified: Secondary | ICD-10-CM | POA: Diagnosis not present

## 2022-05-21 DIAGNOSIS — M549 Dorsalgia, unspecified: Secondary | ICD-10-CM | POA: Diagnosis not present

## 2022-05-21 DIAGNOSIS — D509 Iron deficiency anemia, unspecified: Secondary | ICD-10-CM | POA: Diagnosis not present

## 2022-05-21 DIAGNOSIS — N184 Chronic kidney disease, stage 4 (severe): Secondary | ICD-10-CM | POA: Diagnosis not present

## 2022-05-21 DIAGNOSIS — M109 Gout, unspecified: Secondary | ICD-10-CM | POA: Diagnosis not present

## 2022-05-21 DIAGNOSIS — G250 Essential tremor: Secondary | ICD-10-CM | POA: Diagnosis not present

## 2022-05-21 DIAGNOSIS — E1169 Type 2 diabetes mellitus with other specified complication: Secondary | ICD-10-CM | POA: Diagnosis not present

## 2022-05-21 DIAGNOSIS — K219 Gastro-esophageal reflux disease without esophagitis: Secondary | ICD-10-CM | POA: Diagnosis not present

## 2022-05-21 DIAGNOSIS — J45909 Unspecified asthma, uncomplicated: Secondary | ICD-10-CM | POA: Diagnosis not present

## 2022-05-21 DIAGNOSIS — I1310 Hypertensive heart and chronic kidney disease without heart failure, with stage 1 through stage 4 chronic kidney disease, or unspecified chronic kidney disease: Secondary | ICD-10-CM | POA: Diagnosis not present

## 2022-05-25 DIAGNOSIS — G4700 Insomnia, unspecified: Secondary | ICD-10-CM | POA: Diagnosis not present

## 2022-05-25 DIAGNOSIS — M109 Gout, unspecified: Secondary | ICD-10-CM | POA: Diagnosis not present

## 2022-05-25 DIAGNOSIS — D509 Iron deficiency anemia, unspecified: Secondary | ICD-10-CM | POA: Diagnosis not present

## 2022-05-25 DIAGNOSIS — E7800 Pure hypercholesterolemia, unspecified: Secondary | ICD-10-CM | POA: Diagnosis not present

## 2022-05-25 DIAGNOSIS — J45909 Unspecified asthma, uncomplicated: Secondary | ICD-10-CM | POA: Diagnosis not present

## 2022-05-25 DIAGNOSIS — N3281 Overactive bladder: Secondary | ICD-10-CM | POA: Diagnosis not present

## 2022-05-25 DIAGNOSIS — E1169 Type 2 diabetes mellitus with other specified complication: Secondary | ICD-10-CM | POA: Diagnosis not present

## 2022-05-25 DIAGNOSIS — G309 Alzheimer's disease, unspecified: Secondary | ICD-10-CM | POA: Diagnosis not present

## 2022-05-25 DIAGNOSIS — N179 Acute kidney failure, unspecified: Secondary | ICD-10-CM | POA: Diagnosis not present

## 2022-05-25 DIAGNOSIS — N184 Chronic kidney disease, stage 4 (severe): Secondary | ICD-10-CM | POA: Diagnosis not present

## 2022-05-25 DIAGNOSIS — E1140 Type 2 diabetes mellitus with diabetic neuropathy, unspecified: Secondary | ICD-10-CM | POA: Diagnosis not present

## 2022-05-25 DIAGNOSIS — G250 Essential tremor: Secondary | ICD-10-CM | POA: Diagnosis not present

## 2022-05-25 DIAGNOSIS — I1310 Hypertensive heart and chronic kidney disease without heart failure, with stage 1 through stage 4 chronic kidney disease, or unspecified chronic kidney disease: Secondary | ICD-10-CM | POA: Diagnosis not present

## 2022-05-25 DIAGNOSIS — G8929 Other chronic pain: Secondary | ICD-10-CM | POA: Diagnosis not present

## 2022-05-25 DIAGNOSIS — K219 Gastro-esophageal reflux disease without esophagitis: Secondary | ICD-10-CM | POA: Diagnosis not present

## 2022-05-25 DIAGNOSIS — G4762 Sleep related leg cramps: Secondary | ICD-10-CM | POA: Diagnosis not present

## 2022-05-25 DIAGNOSIS — D631 Anemia in chronic kidney disease: Secondary | ICD-10-CM | POA: Diagnosis not present

## 2022-05-25 DIAGNOSIS — E1122 Type 2 diabetes mellitus with diabetic chronic kidney disease: Secondary | ICD-10-CM | POA: Diagnosis not present

## 2022-05-25 DIAGNOSIS — I951 Orthostatic hypotension: Secondary | ICD-10-CM | POA: Diagnosis not present

## 2022-05-25 DIAGNOSIS — M549 Dorsalgia, unspecified: Secondary | ICD-10-CM | POA: Diagnosis not present

## 2022-05-25 DIAGNOSIS — F32A Depression, unspecified: Secondary | ICD-10-CM | POA: Diagnosis not present

## 2022-05-25 DIAGNOSIS — G2581 Restless legs syndrome: Secondary | ICD-10-CM | POA: Diagnosis not present

## 2022-05-25 DIAGNOSIS — E559 Vitamin D deficiency, unspecified: Secondary | ICD-10-CM | POA: Diagnosis not present

## 2022-05-26 DIAGNOSIS — E1140 Type 2 diabetes mellitus with diabetic neuropathy, unspecified: Secondary | ICD-10-CM | POA: Diagnosis not present

## 2022-05-26 DIAGNOSIS — E1169 Type 2 diabetes mellitus with other specified complication: Secondary | ICD-10-CM | POA: Diagnosis not present

## 2022-05-26 DIAGNOSIS — N3281 Overactive bladder: Secondary | ICD-10-CM | POA: Diagnosis not present

## 2022-05-26 DIAGNOSIS — G309 Alzheimer's disease, unspecified: Secondary | ICD-10-CM | POA: Diagnosis not present

## 2022-05-26 DIAGNOSIS — G2581 Restless legs syndrome: Secondary | ICD-10-CM | POA: Diagnosis not present

## 2022-05-26 DIAGNOSIS — N179 Acute kidney failure, unspecified: Secondary | ICD-10-CM | POA: Diagnosis not present

## 2022-05-26 DIAGNOSIS — E7800 Pure hypercholesterolemia, unspecified: Secondary | ICD-10-CM | POA: Diagnosis not present

## 2022-05-26 DIAGNOSIS — G4700 Insomnia, unspecified: Secondary | ICD-10-CM | POA: Diagnosis not present

## 2022-05-26 DIAGNOSIS — E1122 Type 2 diabetes mellitus with diabetic chronic kidney disease: Secondary | ICD-10-CM | POA: Diagnosis not present

## 2022-05-26 DIAGNOSIS — M109 Gout, unspecified: Secondary | ICD-10-CM | POA: Diagnosis not present

## 2022-05-26 DIAGNOSIS — D509 Iron deficiency anemia, unspecified: Secondary | ICD-10-CM | POA: Diagnosis not present

## 2022-05-26 DIAGNOSIS — D631 Anemia in chronic kidney disease: Secondary | ICD-10-CM | POA: Diagnosis not present

## 2022-05-26 DIAGNOSIS — G4762 Sleep related leg cramps: Secondary | ICD-10-CM | POA: Diagnosis not present

## 2022-05-26 DIAGNOSIS — G8929 Other chronic pain: Secondary | ICD-10-CM | POA: Diagnosis not present

## 2022-05-26 DIAGNOSIS — I951 Orthostatic hypotension: Secondary | ICD-10-CM | POA: Diagnosis not present

## 2022-05-26 DIAGNOSIS — M549 Dorsalgia, unspecified: Secondary | ICD-10-CM | POA: Diagnosis not present

## 2022-05-26 DIAGNOSIS — J45909 Unspecified asthma, uncomplicated: Secondary | ICD-10-CM | POA: Diagnosis not present

## 2022-05-26 DIAGNOSIS — I1310 Hypertensive heart and chronic kidney disease without heart failure, with stage 1 through stage 4 chronic kidney disease, or unspecified chronic kidney disease: Secondary | ICD-10-CM | POA: Diagnosis not present

## 2022-05-26 DIAGNOSIS — K219 Gastro-esophageal reflux disease without esophagitis: Secondary | ICD-10-CM | POA: Diagnosis not present

## 2022-05-26 DIAGNOSIS — N184 Chronic kidney disease, stage 4 (severe): Secondary | ICD-10-CM | POA: Diagnosis not present

## 2022-05-26 DIAGNOSIS — G250 Essential tremor: Secondary | ICD-10-CM | POA: Diagnosis not present

## 2022-05-26 DIAGNOSIS — F32A Depression, unspecified: Secondary | ICD-10-CM | POA: Diagnosis not present

## 2022-05-26 DIAGNOSIS — E559 Vitamin D deficiency, unspecified: Secondary | ICD-10-CM | POA: Diagnosis not present

## 2022-05-28 DIAGNOSIS — N184 Chronic kidney disease, stage 4 (severe): Secondary | ICD-10-CM | POA: Diagnosis not present

## 2022-05-28 DIAGNOSIS — G4700 Insomnia, unspecified: Secondary | ICD-10-CM | POA: Diagnosis not present

## 2022-05-28 DIAGNOSIS — J301 Allergic rhinitis due to pollen: Secondary | ICD-10-CM | POA: Diagnosis not present

## 2022-05-28 DIAGNOSIS — G309 Alzheimer's disease, unspecified: Secondary | ICD-10-CM | POA: Diagnosis not present

## 2022-05-28 DIAGNOSIS — F32A Depression, unspecified: Secondary | ICD-10-CM | POA: Diagnosis not present

## 2022-05-28 DIAGNOSIS — G8911 Acute pain due to trauma: Secondary | ICD-10-CM | POA: Diagnosis not present

## 2022-05-28 DIAGNOSIS — D631 Anemia in chronic kidney disease: Secondary | ICD-10-CM | POA: Diagnosis not present

## 2022-05-28 DIAGNOSIS — J45909 Unspecified asthma, uncomplicated: Secondary | ICD-10-CM | POA: Diagnosis not present

## 2022-05-28 DIAGNOSIS — G4762 Sleep related leg cramps: Secondary | ICD-10-CM | POA: Diagnosis not present

## 2022-05-28 DIAGNOSIS — E1169 Type 2 diabetes mellitus with other specified complication: Secondary | ICD-10-CM | POA: Diagnosis not present

## 2022-05-28 DIAGNOSIS — M109 Gout, unspecified: Secondary | ICD-10-CM | POA: Diagnosis not present

## 2022-05-28 DIAGNOSIS — E1122 Type 2 diabetes mellitus with diabetic chronic kidney disease: Secondary | ICD-10-CM | POA: Diagnosis not present

## 2022-05-28 DIAGNOSIS — Z743 Need for continuous supervision: Secondary | ICD-10-CM | POA: Diagnosis not present

## 2022-05-28 DIAGNOSIS — N3281 Overactive bladder: Secondary | ICD-10-CM | POA: Diagnosis not present

## 2022-05-28 DIAGNOSIS — G250 Essential tremor: Secondary | ICD-10-CM | POA: Diagnosis not present

## 2022-05-28 DIAGNOSIS — E1140 Type 2 diabetes mellitus with diabetic neuropathy, unspecified: Secondary | ICD-10-CM | POA: Diagnosis not present

## 2022-05-28 DIAGNOSIS — G4733 Obstructive sleep apnea (adult) (pediatric): Secondary | ICD-10-CM | POA: Diagnosis not present

## 2022-05-28 DIAGNOSIS — N179 Acute kidney failure, unspecified: Secondary | ICD-10-CM | POA: Diagnosis not present

## 2022-05-28 DIAGNOSIS — K219 Gastro-esophageal reflux disease without esophagitis: Secondary | ICD-10-CM | POA: Diagnosis not present

## 2022-05-28 DIAGNOSIS — G2581 Restless legs syndrome: Secondary | ICD-10-CM | POA: Diagnosis not present

## 2022-05-28 DIAGNOSIS — I951 Orthostatic hypotension: Secondary | ICD-10-CM | POA: Diagnosis not present

## 2022-05-28 DIAGNOSIS — M542 Cervicalgia: Secondary | ICD-10-CM | POA: Diagnosis not present

## 2022-05-28 DIAGNOSIS — M549 Dorsalgia, unspecified: Secondary | ICD-10-CM | POA: Diagnosis not present

## 2022-05-28 DIAGNOSIS — I1310 Hypertensive heart and chronic kidney disease without heart failure, with stage 1 through stage 4 chronic kidney disease, or unspecified chronic kidney disease: Secondary | ICD-10-CM | POA: Diagnosis not present

## 2022-05-28 DIAGNOSIS — E559 Vitamin D deficiency, unspecified: Secondary | ICD-10-CM | POA: Diagnosis not present

## 2022-05-28 DIAGNOSIS — E7800 Pure hypercholesterolemia, unspecified: Secondary | ICD-10-CM | POA: Diagnosis not present

## 2022-05-28 DIAGNOSIS — J4540 Moderate persistent asthma, uncomplicated: Secondary | ICD-10-CM | POA: Diagnosis not present

## 2022-05-28 DIAGNOSIS — G8929 Other chronic pain: Secondary | ICD-10-CM | POA: Diagnosis not present

## 2022-05-28 DIAGNOSIS — D509 Iron deficiency anemia, unspecified: Secondary | ICD-10-CM | POA: Diagnosis not present

## 2022-06-03 DIAGNOSIS — E876 Hypokalemia: Secondary | ICD-10-CM | POA: Diagnosis not present

## 2022-06-03 DIAGNOSIS — J45909 Unspecified asthma, uncomplicated: Secondary | ICD-10-CM | POA: Diagnosis not present

## 2022-06-03 DIAGNOSIS — G309 Alzheimer's disease, unspecified: Secondary | ICD-10-CM | POA: Diagnosis not present

## 2022-06-03 DIAGNOSIS — E7800 Pure hypercholesterolemia, unspecified: Secondary | ICD-10-CM | POA: Diagnosis not present

## 2022-06-03 DIAGNOSIS — M4802 Spinal stenosis, cervical region: Secondary | ICD-10-CM | POA: Diagnosis not present

## 2022-06-03 DIAGNOSIS — R7303 Prediabetes: Secondary | ICD-10-CM | POA: Diagnosis not present

## 2022-06-03 DIAGNOSIS — D631 Anemia in chronic kidney disease: Secondary | ICD-10-CM | POA: Diagnosis not present

## 2022-06-03 DIAGNOSIS — M47812 Spondylosis without myelopathy or radiculopathy, cervical region: Secondary | ICD-10-CM | POA: Diagnosis not present

## 2022-06-03 DIAGNOSIS — M4312 Spondylolisthesis, cervical region: Secondary | ICD-10-CM | POA: Diagnosis not present

## 2022-06-03 DIAGNOSIS — I129 Hypertensive chronic kidney disease with stage 1 through stage 4 chronic kidney disease, or unspecified chronic kidney disease: Secondary | ICD-10-CM | POA: Diagnosis not present

## 2022-06-03 DIAGNOSIS — M50123 Cervical disc disorder at C6-C7 level with radiculopathy: Secondary | ICD-10-CM | POA: Diagnosis not present

## 2022-06-03 DIAGNOSIS — I10 Essential (primary) hypertension: Secondary | ICD-10-CM | POA: Diagnosis not present

## 2022-06-03 DIAGNOSIS — M5031 Other cervical disc degeneration,  high cervical region: Secondary | ICD-10-CM | POA: Diagnosis not present

## 2022-06-03 DIAGNOSIS — M4722 Other spondylosis with radiculopathy, cervical region: Secondary | ICD-10-CM | POA: Diagnosis not present

## 2022-06-03 DIAGNOSIS — M50321 Other cervical disc degeneration at C4-C5 level: Secondary | ICD-10-CM | POA: Diagnosis not present

## 2022-06-03 DIAGNOSIS — M2578 Osteophyte, vertebrae: Secondary | ICD-10-CM | POA: Diagnosis not present

## 2022-06-03 DIAGNOSIS — N183 Chronic kidney disease, stage 3 unspecified: Secondary | ICD-10-CM | POA: Diagnosis not present

## 2022-06-03 DIAGNOSIS — M50322 Other cervical disc degeneration at C5-C6 level: Secondary | ICD-10-CM | POA: Diagnosis not present

## 2022-06-03 DIAGNOSIS — Z79899 Other long term (current) drug therapy: Secondary | ICD-10-CM | POA: Diagnosis not present

## 2022-06-04 ENCOUNTER — Telehealth: Payer: Self-pay | Admitting: *Deleted

## 2022-06-04 NOTE — Telephone Encounter (Signed)
Transition Care Management Unsuccessful Follow-up Telephone Call  Date of discharge and from where:  [place] ed 05/28/2022  Attempts:  1st Attempt  Reason for unsuccessful TCM follow-up call:  Left voice message

## 2022-06-05 ENCOUNTER — Telehealth: Payer: Self-pay | Admitting: *Deleted

## 2022-06-05 DIAGNOSIS — J45909 Unspecified asthma, uncomplicated: Secondary | ICD-10-CM | POA: Diagnosis not present

## 2022-06-05 DIAGNOSIS — G309 Alzheimer's disease, unspecified: Secondary | ICD-10-CM | POA: Diagnosis not present

## 2022-06-05 DIAGNOSIS — F0283 Dementia in other diseases classified elsewhere, unspecified severity, with mood disturbance: Secondary | ICD-10-CM | POA: Diagnosis not present

## 2022-06-05 DIAGNOSIS — E7800 Pure hypercholesterolemia, unspecified: Secondary | ICD-10-CM | POA: Diagnosis not present

## 2022-06-05 DIAGNOSIS — I951 Orthostatic hypotension: Secondary | ICD-10-CM | POA: Diagnosis not present

## 2022-06-05 DIAGNOSIS — G250 Essential tremor: Secondary | ICD-10-CM | POA: Diagnosis not present

## 2022-06-05 DIAGNOSIS — G4700 Insomnia, unspecified: Secondary | ICD-10-CM | POA: Diagnosis not present

## 2022-06-05 DIAGNOSIS — E1142 Type 2 diabetes mellitus with diabetic polyneuropathy: Secondary | ICD-10-CM | POA: Diagnosis not present

## 2022-06-05 DIAGNOSIS — E1122 Type 2 diabetes mellitus with diabetic chronic kidney disease: Secondary | ICD-10-CM | POA: Diagnosis not present

## 2022-06-05 DIAGNOSIS — M4722 Other spondylosis with radiculopathy, cervical region: Secondary | ICD-10-CM | POA: Diagnosis not present

## 2022-06-05 DIAGNOSIS — D631 Anemia in chronic kidney disease: Secondary | ICD-10-CM | POA: Diagnosis not present

## 2022-06-05 DIAGNOSIS — K219 Gastro-esophageal reflux disease without esophagitis: Secondary | ICD-10-CM | POA: Diagnosis not present

## 2022-06-05 DIAGNOSIS — G4762 Sleep related leg cramps: Secondary | ICD-10-CM | POA: Diagnosis not present

## 2022-06-05 DIAGNOSIS — N184 Chronic kidney disease, stage 4 (severe): Secondary | ICD-10-CM | POA: Diagnosis not present

## 2022-06-05 DIAGNOSIS — I1310 Hypertensive heart and chronic kidney disease without heart failure, with stage 1 through stage 4 chronic kidney disease, or unspecified chronic kidney disease: Secondary | ICD-10-CM | POA: Diagnosis not present

## 2022-06-05 DIAGNOSIS — M5011 Cervical disc disorder with radiculopathy,  high cervical region: Secondary | ICD-10-CM | POA: Diagnosis not present

## 2022-06-05 DIAGNOSIS — N3281 Overactive bladder: Secondary | ICD-10-CM | POA: Diagnosis not present

## 2022-06-05 DIAGNOSIS — G2581 Restless legs syndrome: Secondary | ICD-10-CM | POA: Diagnosis not present

## 2022-06-05 DIAGNOSIS — F02818 Dementia in other diseases classified elsewhere, unspecified severity, with other behavioral disturbance: Secondary | ICD-10-CM | POA: Diagnosis not present

## 2022-06-05 DIAGNOSIS — Z4789 Encounter for other orthopedic aftercare: Secondary | ICD-10-CM | POA: Diagnosis not present

## 2022-06-05 DIAGNOSIS — E1169 Type 2 diabetes mellitus with other specified complication: Secondary | ICD-10-CM | POA: Diagnosis not present

## 2022-06-05 DIAGNOSIS — N179 Acute kidney failure, unspecified: Secondary | ICD-10-CM | POA: Diagnosis not present

## 2022-06-05 DIAGNOSIS — F0284 Dementia in other diseases classified elsewhere, unspecified severity, with anxiety: Secondary | ICD-10-CM | POA: Diagnosis not present

## 2022-06-05 DIAGNOSIS — M109 Gout, unspecified: Secondary | ICD-10-CM | POA: Diagnosis not present

## 2022-06-05 NOTE — Telephone Encounter (Signed)
Transition Care Management Unsuccessful Follow-up Telephone Call  Date of discharge and from where:  [facility] 05/28/2022  Attempts:  2nd Attempt  Reason for unsuccessful TCM follow-up call:  Left voice message

## 2022-06-08 DIAGNOSIS — M4722 Other spondylosis with radiculopathy, cervical region: Secondary | ICD-10-CM | POA: Diagnosis not present

## 2022-06-08 DIAGNOSIS — N179 Acute kidney failure, unspecified: Secondary | ICD-10-CM | POA: Diagnosis not present

## 2022-06-08 DIAGNOSIS — N184 Chronic kidney disease, stage 4 (severe): Secondary | ICD-10-CM | POA: Diagnosis not present

## 2022-06-08 DIAGNOSIS — J45909 Unspecified asthma, uncomplicated: Secondary | ICD-10-CM | POA: Diagnosis not present

## 2022-06-08 DIAGNOSIS — G2581 Restless legs syndrome: Secondary | ICD-10-CM | POA: Diagnosis not present

## 2022-06-08 DIAGNOSIS — M5011 Cervical disc disorder with radiculopathy,  high cervical region: Secondary | ICD-10-CM | POA: Diagnosis not present

## 2022-06-08 DIAGNOSIS — F0284 Dementia in other diseases classified elsewhere, unspecified severity, with anxiety: Secondary | ICD-10-CM | POA: Diagnosis not present

## 2022-06-08 DIAGNOSIS — Z4789 Encounter for other orthopedic aftercare: Secondary | ICD-10-CM | POA: Diagnosis not present

## 2022-06-08 DIAGNOSIS — G250 Essential tremor: Secondary | ICD-10-CM | POA: Diagnosis not present

## 2022-06-08 DIAGNOSIS — E7800 Pure hypercholesterolemia, unspecified: Secondary | ICD-10-CM | POA: Diagnosis not present

## 2022-06-08 DIAGNOSIS — K219 Gastro-esophageal reflux disease without esophagitis: Secondary | ICD-10-CM | POA: Diagnosis not present

## 2022-06-08 DIAGNOSIS — I951 Orthostatic hypotension: Secondary | ICD-10-CM | POA: Diagnosis not present

## 2022-06-08 DIAGNOSIS — M109 Gout, unspecified: Secondary | ICD-10-CM | POA: Diagnosis not present

## 2022-06-08 DIAGNOSIS — G309 Alzheimer's disease, unspecified: Secondary | ICD-10-CM | POA: Diagnosis not present

## 2022-06-08 DIAGNOSIS — F0283 Dementia in other diseases classified elsewhere, unspecified severity, with mood disturbance: Secondary | ICD-10-CM | POA: Diagnosis not present

## 2022-06-08 DIAGNOSIS — I1310 Hypertensive heart and chronic kidney disease without heart failure, with stage 1 through stage 4 chronic kidney disease, or unspecified chronic kidney disease: Secondary | ICD-10-CM | POA: Diagnosis not present

## 2022-06-08 DIAGNOSIS — F02818 Dementia in other diseases classified elsewhere, unspecified severity, with other behavioral disturbance: Secondary | ICD-10-CM | POA: Diagnosis not present

## 2022-06-08 DIAGNOSIS — E1142 Type 2 diabetes mellitus with diabetic polyneuropathy: Secondary | ICD-10-CM | POA: Diagnosis not present

## 2022-06-08 DIAGNOSIS — G4700 Insomnia, unspecified: Secondary | ICD-10-CM | POA: Diagnosis not present

## 2022-06-08 DIAGNOSIS — D631 Anemia in chronic kidney disease: Secondary | ICD-10-CM | POA: Diagnosis not present

## 2022-06-08 DIAGNOSIS — G4762 Sleep related leg cramps: Secondary | ICD-10-CM | POA: Diagnosis not present

## 2022-06-08 DIAGNOSIS — E1122 Type 2 diabetes mellitus with diabetic chronic kidney disease: Secondary | ICD-10-CM | POA: Diagnosis not present

## 2022-06-08 DIAGNOSIS — E1169 Type 2 diabetes mellitus with other specified complication: Secondary | ICD-10-CM | POA: Diagnosis not present

## 2022-06-08 DIAGNOSIS — N3281 Overactive bladder: Secondary | ICD-10-CM | POA: Diagnosis not present

## 2022-06-09 DIAGNOSIS — N3281 Overactive bladder: Secondary | ICD-10-CM | POA: Diagnosis not present

## 2022-06-09 DIAGNOSIS — F0284 Dementia in other diseases classified elsewhere, unspecified severity, with anxiety: Secondary | ICD-10-CM | POA: Diagnosis not present

## 2022-06-09 DIAGNOSIS — G250 Essential tremor: Secondary | ICD-10-CM | POA: Diagnosis not present

## 2022-06-09 DIAGNOSIS — F02818 Dementia in other diseases classified elsewhere, unspecified severity, with other behavioral disturbance: Secondary | ICD-10-CM | POA: Diagnosis not present

## 2022-06-09 DIAGNOSIS — E7800 Pure hypercholesterolemia, unspecified: Secondary | ICD-10-CM | POA: Diagnosis not present

## 2022-06-09 DIAGNOSIS — M109 Gout, unspecified: Secondary | ICD-10-CM | POA: Diagnosis not present

## 2022-06-09 DIAGNOSIS — M5011 Cervical disc disorder with radiculopathy,  high cervical region: Secondary | ICD-10-CM | POA: Diagnosis not present

## 2022-06-09 DIAGNOSIS — G4700 Insomnia, unspecified: Secondary | ICD-10-CM | POA: Diagnosis not present

## 2022-06-09 DIAGNOSIS — E1122 Type 2 diabetes mellitus with diabetic chronic kidney disease: Secondary | ICD-10-CM | POA: Diagnosis not present

## 2022-06-09 DIAGNOSIS — N184 Chronic kidney disease, stage 4 (severe): Secondary | ICD-10-CM | POA: Diagnosis not present

## 2022-06-09 DIAGNOSIS — G4762 Sleep related leg cramps: Secondary | ICD-10-CM | POA: Diagnosis not present

## 2022-06-09 DIAGNOSIS — G2581 Restless legs syndrome: Secondary | ICD-10-CM | POA: Diagnosis not present

## 2022-06-09 DIAGNOSIS — F0283 Dementia in other diseases classified elsewhere, unspecified severity, with mood disturbance: Secondary | ICD-10-CM | POA: Diagnosis not present

## 2022-06-09 DIAGNOSIS — E1169 Type 2 diabetes mellitus with other specified complication: Secondary | ICD-10-CM | POA: Diagnosis not present

## 2022-06-09 DIAGNOSIS — N179 Acute kidney failure, unspecified: Secondary | ICD-10-CM | POA: Diagnosis not present

## 2022-06-09 DIAGNOSIS — J45909 Unspecified asthma, uncomplicated: Secondary | ICD-10-CM | POA: Diagnosis not present

## 2022-06-09 DIAGNOSIS — Z4789 Encounter for other orthopedic aftercare: Secondary | ICD-10-CM | POA: Diagnosis not present

## 2022-06-09 DIAGNOSIS — I951 Orthostatic hypotension: Secondary | ICD-10-CM | POA: Diagnosis not present

## 2022-06-09 DIAGNOSIS — G309 Alzheimer's disease, unspecified: Secondary | ICD-10-CM | POA: Diagnosis not present

## 2022-06-09 DIAGNOSIS — M4722 Other spondylosis with radiculopathy, cervical region: Secondary | ICD-10-CM | POA: Diagnosis not present

## 2022-06-09 DIAGNOSIS — E1142 Type 2 diabetes mellitus with diabetic polyneuropathy: Secondary | ICD-10-CM | POA: Diagnosis not present

## 2022-06-09 DIAGNOSIS — I1310 Hypertensive heart and chronic kidney disease without heart failure, with stage 1 through stage 4 chronic kidney disease, or unspecified chronic kidney disease: Secondary | ICD-10-CM | POA: Diagnosis not present

## 2022-06-09 DIAGNOSIS — K219 Gastro-esophageal reflux disease without esophagitis: Secondary | ICD-10-CM | POA: Diagnosis not present

## 2022-06-09 DIAGNOSIS — D631 Anemia in chronic kidney disease: Secondary | ICD-10-CM | POA: Diagnosis not present

## 2022-06-12 DIAGNOSIS — G4700 Insomnia, unspecified: Secondary | ICD-10-CM | POA: Diagnosis not present

## 2022-06-12 DIAGNOSIS — M5011 Cervical disc disorder with radiculopathy,  high cervical region: Secondary | ICD-10-CM | POA: Diagnosis not present

## 2022-06-12 DIAGNOSIS — E1142 Type 2 diabetes mellitus with diabetic polyneuropathy: Secondary | ICD-10-CM | POA: Diagnosis not present

## 2022-06-12 DIAGNOSIS — F02818 Dementia in other diseases classified elsewhere, unspecified severity, with other behavioral disturbance: Secondary | ICD-10-CM | POA: Diagnosis not present

## 2022-06-12 DIAGNOSIS — E1122 Type 2 diabetes mellitus with diabetic chronic kidney disease: Secondary | ICD-10-CM | POA: Diagnosis not present

## 2022-06-12 DIAGNOSIS — J45909 Unspecified asthma, uncomplicated: Secondary | ICD-10-CM | POA: Diagnosis not present

## 2022-06-12 DIAGNOSIS — E7800 Pure hypercholesterolemia, unspecified: Secondary | ICD-10-CM | POA: Diagnosis not present

## 2022-06-12 DIAGNOSIS — G4762 Sleep related leg cramps: Secondary | ICD-10-CM | POA: Diagnosis not present

## 2022-06-12 DIAGNOSIS — Z4789 Encounter for other orthopedic aftercare: Secondary | ICD-10-CM | POA: Diagnosis not present

## 2022-06-12 DIAGNOSIS — I1310 Hypertensive heart and chronic kidney disease without heart failure, with stage 1 through stage 4 chronic kidney disease, or unspecified chronic kidney disease: Secondary | ICD-10-CM | POA: Diagnosis not present

## 2022-06-12 DIAGNOSIS — G250 Essential tremor: Secondary | ICD-10-CM | POA: Diagnosis not present

## 2022-06-12 DIAGNOSIS — E1169 Type 2 diabetes mellitus with other specified complication: Secondary | ICD-10-CM | POA: Diagnosis not present

## 2022-06-12 DIAGNOSIS — N3281 Overactive bladder: Secondary | ICD-10-CM | POA: Diagnosis not present

## 2022-06-12 DIAGNOSIS — D631 Anemia in chronic kidney disease: Secondary | ICD-10-CM | POA: Diagnosis not present

## 2022-06-12 DIAGNOSIS — N184 Chronic kidney disease, stage 4 (severe): Secondary | ICD-10-CM | POA: Diagnosis not present

## 2022-06-12 DIAGNOSIS — M4722 Other spondylosis with radiculopathy, cervical region: Secondary | ICD-10-CM | POA: Diagnosis not present

## 2022-06-12 DIAGNOSIS — M109 Gout, unspecified: Secondary | ICD-10-CM | POA: Diagnosis not present

## 2022-06-12 DIAGNOSIS — G309 Alzheimer's disease, unspecified: Secondary | ICD-10-CM | POA: Diagnosis not present

## 2022-06-12 DIAGNOSIS — I951 Orthostatic hypotension: Secondary | ICD-10-CM | POA: Diagnosis not present

## 2022-06-12 DIAGNOSIS — F0283 Dementia in other diseases classified elsewhere, unspecified severity, with mood disturbance: Secondary | ICD-10-CM | POA: Diagnosis not present

## 2022-06-12 DIAGNOSIS — K219 Gastro-esophageal reflux disease without esophagitis: Secondary | ICD-10-CM | POA: Diagnosis not present

## 2022-06-12 DIAGNOSIS — G2581 Restless legs syndrome: Secondary | ICD-10-CM | POA: Diagnosis not present

## 2022-06-12 DIAGNOSIS — F0284 Dementia in other diseases classified elsewhere, unspecified severity, with anxiety: Secondary | ICD-10-CM | POA: Diagnosis not present

## 2022-06-12 DIAGNOSIS — N179 Acute kidney failure, unspecified: Secondary | ICD-10-CM | POA: Diagnosis not present

## 2022-06-16 DIAGNOSIS — J45909 Unspecified asthma, uncomplicated: Secondary | ICD-10-CM | POA: Diagnosis not present

## 2022-06-16 DIAGNOSIS — G4700 Insomnia, unspecified: Secondary | ICD-10-CM | POA: Diagnosis not present

## 2022-06-16 DIAGNOSIS — G250 Essential tremor: Secondary | ICD-10-CM | POA: Diagnosis not present

## 2022-06-16 DIAGNOSIS — G4762 Sleep related leg cramps: Secondary | ICD-10-CM | POA: Diagnosis not present

## 2022-06-16 DIAGNOSIS — M109 Gout, unspecified: Secondary | ICD-10-CM | POA: Diagnosis not present

## 2022-06-16 DIAGNOSIS — G2581 Restless legs syndrome: Secondary | ICD-10-CM | POA: Diagnosis not present

## 2022-06-16 DIAGNOSIS — E7800 Pure hypercholesterolemia, unspecified: Secondary | ICD-10-CM | POA: Diagnosis not present

## 2022-06-16 DIAGNOSIS — N184 Chronic kidney disease, stage 4 (severe): Secondary | ICD-10-CM | POA: Diagnosis not present

## 2022-06-16 DIAGNOSIS — E1122 Type 2 diabetes mellitus with diabetic chronic kidney disease: Secondary | ICD-10-CM | POA: Diagnosis not present

## 2022-06-16 DIAGNOSIS — K219 Gastro-esophageal reflux disease without esophagitis: Secondary | ICD-10-CM | POA: Diagnosis not present

## 2022-06-16 DIAGNOSIS — F0284 Dementia in other diseases classified elsewhere, unspecified severity, with anxiety: Secondary | ICD-10-CM | POA: Diagnosis not present

## 2022-06-16 DIAGNOSIS — F02818 Dementia in other diseases classified elsewhere, unspecified severity, with other behavioral disturbance: Secondary | ICD-10-CM | POA: Diagnosis not present

## 2022-06-16 DIAGNOSIS — I1310 Hypertensive heart and chronic kidney disease without heart failure, with stage 1 through stage 4 chronic kidney disease, or unspecified chronic kidney disease: Secondary | ICD-10-CM | POA: Diagnosis not present

## 2022-06-16 DIAGNOSIS — G309 Alzheimer's disease, unspecified: Secondary | ICD-10-CM | POA: Diagnosis not present

## 2022-06-16 DIAGNOSIS — F0283 Dementia in other diseases classified elsewhere, unspecified severity, with mood disturbance: Secondary | ICD-10-CM | POA: Diagnosis not present

## 2022-06-16 DIAGNOSIS — M4722 Other spondylosis with radiculopathy, cervical region: Secondary | ICD-10-CM | POA: Diagnosis not present

## 2022-06-16 DIAGNOSIS — N3281 Overactive bladder: Secondary | ICD-10-CM | POA: Diagnosis not present

## 2022-06-16 DIAGNOSIS — M5011 Cervical disc disorder with radiculopathy,  high cervical region: Secondary | ICD-10-CM | POA: Diagnosis not present

## 2022-06-16 DIAGNOSIS — I951 Orthostatic hypotension: Secondary | ICD-10-CM | POA: Diagnosis not present

## 2022-06-16 DIAGNOSIS — E1142 Type 2 diabetes mellitus with diabetic polyneuropathy: Secondary | ICD-10-CM | POA: Diagnosis not present

## 2022-06-16 DIAGNOSIS — Z4789 Encounter for other orthopedic aftercare: Secondary | ICD-10-CM | POA: Diagnosis not present

## 2022-06-16 DIAGNOSIS — E1169 Type 2 diabetes mellitus with other specified complication: Secondary | ICD-10-CM | POA: Diagnosis not present

## 2022-06-16 DIAGNOSIS — D631 Anemia in chronic kidney disease: Secondary | ICD-10-CM | POA: Diagnosis not present

## 2022-06-16 DIAGNOSIS — N179 Acute kidney failure, unspecified: Secondary | ICD-10-CM | POA: Diagnosis not present

## 2022-06-17 DIAGNOSIS — F0283 Dementia in other diseases classified elsewhere, unspecified severity, with mood disturbance: Secondary | ICD-10-CM | POA: Diagnosis not present

## 2022-06-17 DIAGNOSIS — N3281 Overactive bladder: Secondary | ICD-10-CM | POA: Diagnosis not present

## 2022-06-17 DIAGNOSIS — M5011 Cervical disc disorder with radiculopathy,  high cervical region: Secondary | ICD-10-CM | POA: Diagnosis not present

## 2022-06-17 DIAGNOSIS — N184 Chronic kidney disease, stage 4 (severe): Secondary | ICD-10-CM | POA: Diagnosis not present

## 2022-06-17 DIAGNOSIS — E7800 Pure hypercholesterolemia, unspecified: Secondary | ICD-10-CM | POA: Diagnosis not present

## 2022-06-17 DIAGNOSIS — G309 Alzheimer's disease, unspecified: Secondary | ICD-10-CM | POA: Diagnosis not present

## 2022-06-17 DIAGNOSIS — F02818 Dementia in other diseases classified elsewhere, unspecified severity, with other behavioral disturbance: Secondary | ICD-10-CM | POA: Diagnosis not present

## 2022-06-17 DIAGNOSIS — G2581 Restless legs syndrome: Secondary | ICD-10-CM | POA: Diagnosis not present

## 2022-06-17 DIAGNOSIS — G4700 Insomnia, unspecified: Secondary | ICD-10-CM | POA: Diagnosis not present

## 2022-06-17 DIAGNOSIS — E1169 Type 2 diabetes mellitus with other specified complication: Secondary | ICD-10-CM | POA: Diagnosis not present

## 2022-06-17 DIAGNOSIS — D631 Anemia in chronic kidney disease: Secondary | ICD-10-CM | POA: Diagnosis not present

## 2022-06-17 DIAGNOSIS — K219 Gastro-esophageal reflux disease without esophagitis: Secondary | ICD-10-CM | POA: Diagnosis not present

## 2022-06-17 DIAGNOSIS — M109 Gout, unspecified: Secondary | ICD-10-CM | POA: Diagnosis not present

## 2022-06-17 DIAGNOSIS — I951 Orthostatic hypotension: Secondary | ICD-10-CM | POA: Diagnosis not present

## 2022-06-17 DIAGNOSIS — N179 Acute kidney failure, unspecified: Secondary | ICD-10-CM | POA: Diagnosis not present

## 2022-06-17 DIAGNOSIS — I1310 Hypertensive heart and chronic kidney disease without heart failure, with stage 1 through stage 4 chronic kidney disease, or unspecified chronic kidney disease: Secondary | ICD-10-CM | POA: Diagnosis not present

## 2022-06-17 DIAGNOSIS — Z4789 Encounter for other orthopedic aftercare: Secondary | ICD-10-CM | POA: Diagnosis not present

## 2022-06-17 DIAGNOSIS — G250 Essential tremor: Secondary | ICD-10-CM | POA: Diagnosis not present

## 2022-06-17 DIAGNOSIS — G4762 Sleep related leg cramps: Secondary | ICD-10-CM | POA: Diagnosis not present

## 2022-06-17 DIAGNOSIS — E1142 Type 2 diabetes mellitus with diabetic polyneuropathy: Secondary | ICD-10-CM | POA: Diagnosis not present

## 2022-06-17 DIAGNOSIS — J45909 Unspecified asthma, uncomplicated: Secondary | ICD-10-CM | POA: Diagnosis not present

## 2022-06-17 DIAGNOSIS — E1122 Type 2 diabetes mellitus with diabetic chronic kidney disease: Secondary | ICD-10-CM | POA: Diagnosis not present

## 2022-06-17 DIAGNOSIS — M4722 Other spondylosis with radiculopathy, cervical region: Secondary | ICD-10-CM | POA: Diagnosis not present

## 2022-06-17 DIAGNOSIS — F0284 Dementia in other diseases classified elsewhere, unspecified severity, with anxiety: Secondary | ICD-10-CM | POA: Diagnosis not present

## 2022-06-18 DIAGNOSIS — F0284 Dementia in other diseases classified elsewhere, unspecified severity, with anxiety: Secondary | ICD-10-CM | POA: Diagnosis not present

## 2022-06-18 DIAGNOSIS — N184 Chronic kidney disease, stage 4 (severe): Secondary | ICD-10-CM | POA: Diagnosis not present

## 2022-06-18 DIAGNOSIS — G4700 Insomnia, unspecified: Secondary | ICD-10-CM | POA: Diagnosis not present

## 2022-06-18 DIAGNOSIS — G250 Essential tremor: Secondary | ICD-10-CM | POA: Diagnosis not present

## 2022-06-18 DIAGNOSIS — J45909 Unspecified asthma, uncomplicated: Secondary | ICD-10-CM | POA: Diagnosis not present

## 2022-06-18 DIAGNOSIS — I1310 Hypertensive heart and chronic kidney disease without heart failure, with stage 1 through stage 4 chronic kidney disease, or unspecified chronic kidney disease: Secondary | ICD-10-CM | POA: Diagnosis not present

## 2022-06-18 DIAGNOSIS — N179 Acute kidney failure, unspecified: Secondary | ICD-10-CM | POA: Diagnosis not present

## 2022-06-18 DIAGNOSIS — N3281 Overactive bladder: Secondary | ICD-10-CM | POA: Diagnosis not present

## 2022-06-18 DIAGNOSIS — I951 Orthostatic hypotension: Secondary | ICD-10-CM | POA: Diagnosis not present

## 2022-06-18 DIAGNOSIS — G4762 Sleep related leg cramps: Secondary | ICD-10-CM | POA: Diagnosis not present

## 2022-06-18 DIAGNOSIS — E1142 Type 2 diabetes mellitus with diabetic polyneuropathy: Secondary | ICD-10-CM | POA: Diagnosis not present

## 2022-06-18 DIAGNOSIS — F02818 Dementia in other diseases classified elsewhere, unspecified severity, with other behavioral disturbance: Secondary | ICD-10-CM | POA: Diagnosis not present

## 2022-06-18 DIAGNOSIS — E1122 Type 2 diabetes mellitus with diabetic chronic kidney disease: Secondary | ICD-10-CM | POA: Diagnosis not present

## 2022-06-18 DIAGNOSIS — D631 Anemia in chronic kidney disease: Secondary | ICD-10-CM | POA: Diagnosis not present

## 2022-06-18 DIAGNOSIS — G2581 Restless legs syndrome: Secondary | ICD-10-CM | POA: Diagnosis not present

## 2022-06-18 DIAGNOSIS — M4722 Other spondylosis with radiculopathy, cervical region: Secondary | ICD-10-CM | POA: Diagnosis not present

## 2022-06-18 DIAGNOSIS — Z4789 Encounter for other orthopedic aftercare: Secondary | ICD-10-CM | POA: Diagnosis not present

## 2022-06-18 DIAGNOSIS — M109 Gout, unspecified: Secondary | ICD-10-CM | POA: Diagnosis not present

## 2022-06-18 DIAGNOSIS — F0283 Dementia in other diseases classified elsewhere, unspecified severity, with mood disturbance: Secondary | ICD-10-CM | POA: Diagnosis not present

## 2022-06-18 DIAGNOSIS — G309 Alzheimer's disease, unspecified: Secondary | ICD-10-CM | POA: Diagnosis not present

## 2022-06-18 DIAGNOSIS — M5011 Cervical disc disorder with radiculopathy,  high cervical region: Secondary | ICD-10-CM | POA: Diagnosis not present

## 2022-06-18 DIAGNOSIS — E7800 Pure hypercholesterolemia, unspecified: Secondary | ICD-10-CM | POA: Diagnosis not present

## 2022-06-18 DIAGNOSIS — K219 Gastro-esophageal reflux disease without esophagitis: Secondary | ICD-10-CM | POA: Diagnosis not present

## 2022-06-18 DIAGNOSIS — E1169 Type 2 diabetes mellitus with other specified complication: Secondary | ICD-10-CM | POA: Diagnosis not present

## 2022-06-22 DIAGNOSIS — N183 Chronic kidney disease, stage 3 unspecified: Secondary | ICD-10-CM | POA: Diagnosis not present

## 2022-06-22 DIAGNOSIS — I129 Hypertensive chronic kidney disease with stage 1 through stage 4 chronic kidney disease, or unspecified chronic kidney disease: Secondary | ICD-10-CM | POA: Diagnosis not present

## 2022-06-22 DIAGNOSIS — E1169 Type 2 diabetes mellitus with other specified complication: Secondary | ICD-10-CM | POA: Diagnosis not present

## 2022-06-22 DIAGNOSIS — G4762 Sleep related leg cramps: Secondary | ICD-10-CM | POA: Diagnosis not present

## 2022-06-22 DIAGNOSIS — M5416 Radiculopathy, lumbar region: Secondary | ICD-10-CM | POA: Diagnosis not present

## 2022-06-22 DIAGNOSIS — M109 Gout, unspecified: Secondary | ICD-10-CM | POA: Diagnosis not present

## 2022-06-22 DIAGNOSIS — D638 Anemia in other chronic diseases classified elsewhere: Secondary | ICD-10-CM | POA: Diagnosis not present

## 2022-06-22 DIAGNOSIS — E785 Hyperlipidemia, unspecified: Secondary | ICD-10-CM | POA: Diagnosis not present

## 2022-07-06 DIAGNOSIS — D638 Anemia in other chronic diseases classified elsewhere: Secondary | ICD-10-CM | POA: Diagnosis not present

## 2022-07-06 DIAGNOSIS — D510 Vitamin B12 deficiency anemia due to intrinsic factor deficiency: Secondary | ICD-10-CM | POA: Diagnosis not present

## 2022-07-06 DIAGNOSIS — R531 Weakness: Secondary | ICD-10-CM | POA: Diagnosis not present

## 2022-07-29 ENCOUNTER — Telehealth: Payer: Self-pay | Admitting: Cardiology

## 2022-07-29 NOTE — Telephone Encounter (Signed)
Patient stated she has paperwork she needs to have completed and signed for her DMV.

## 2022-07-29 NOTE — Telephone Encounter (Signed)
Left message for pt to bring paperwork by office to see if we can fill it out. If not her PCP will need to fill it out.

## 2022-07-30 DIAGNOSIS — M47812 Spondylosis without myelopathy or radiculopathy, cervical region: Secondary | ICD-10-CM | POA: Diagnosis not present

## 2022-08-04 DIAGNOSIS — H5213 Myopia, bilateral: Secondary | ICD-10-CM | POA: Diagnosis not present

## 2022-08-04 DIAGNOSIS — M545 Low back pain, unspecified: Secondary | ICD-10-CM | POA: Diagnosis not present

## 2022-08-04 DIAGNOSIS — E119 Type 2 diabetes mellitus without complications: Secondary | ICD-10-CM | POA: Diagnosis not present

## 2022-08-04 DIAGNOSIS — M542 Cervicalgia: Secondary | ICD-10-CM | POA: Diagnosis not present

## 2022-08-04 DIAGNOSIS — M4322 Fusion of spine, cervical region: Secondary | ICD-10-CM | POA: Diagnosis not present

## 2022-08-04 DIAGNOSIS — H524 Presbyopia: Secondary | ICD-10-CM | POA: Diagnosis not present

## 2022-08-04 DIAGNOSIS — H52223 Regular astigmatism, bilateral: Secondary | ICD-10-CM | POA: Diagnosis not present

## 2022-08-06 DIAGNOSIS — R195 Other fecal abnormalities: Secondary | ICD-10-CM | POA: Diagnosis not present

## 2022-08-06 DIAGNOSIS — D638 Anemia in other chronic diseases classified elsewhere: Secondary | ICD-10-CM | POA: Diagnosis not present

## 2022-08-06 DIAGNOSIS — K219 Gastro-esophageal reflux disease without esophagitis: Secondary | ICD-10-CM | POA: Diagnosis not present

## 2022-08-07 DIAGNOSIS — M545 Low back pain, unspecified: Secondary | ICD-10-CM | POA: Diagnosis not present

## 2022-08-07 DIAGNOSIS — M4322 Fusion of spine, cervical region: Secondary | ICD-10-CM | POA: Diagnosis not present

## 2022-08-07 DIAGNOSIS — M542 Cervicalgia: Secondary | ICD-10-CM | POA: Diagnosis not present

## 2022-08-10 ENCOUNTER — Other Ambulatory Visit: Payer: Self-pay

## 2022-08-10 DIAGNOSIS — M1990 Unspecified osteoarthritis, unspecified site: Secondary | ICD-10-CM | POA: Insufficient documentation

## 2022-08-10 DIAGNOSIS — I10 Essential (primary) hypertension: Secondary | ICD-10-CM | POA: Insufficient documentation

## 2022-08-10 DIAGNOSIS — N189 Chronic kidney disease, unspecified: Secondary | ICD-10-CM | POA: Insufficient documentation

## 2022-08-11 NOTE — Progress Notes (Deleted)
Cardiology Office Note:    Date:  08/11/2022   ID:  Sharon Wise, DOB May 12, 1950, MRN [id number]  PCP:  Paulina Fusi, MD  Cardiologist:  Norman Herrlich, MD    Referring MD: Paulina Fusi, MD    ASSESSMENT:    No diagnosis found. PLAN:    In order of problems listed above:  ***   Next appointment: ***   Medication Adjustments/Labs and Tests Ordered: Current medicines are reviewed at length with the patient today.  Concerns regarding medicines are outlined above.  No orders of the defined types were placed in this encounter.  No orders of the defined types were placed in this encounter.    History of Present Illness:    Sharon Wise is a 72 y.o. female with a hx of chest pain with a coronary calcium score of 0 and minimal coronary atherosclerosis hypertension stage III CKD hyperlipidemia last seen more than 3 years ago 06/14/2019.  He was referred back to the office for complaints of palpitation unfortunately office notes are not available.  She had an EKG at [facility] 05/06/2022 showing sinus rhythm sinus arrhythmia was normal EKG.  She had an echocardiogram done at [facility] 03/30/2022 showing ventricular size wall thickness systolic function the right ventricle is moderately enlarged with preserved systolic function left atrium mildly enlarged right atrium moderately enlarged there is elevation of pulmonary artery systolic pressure mildly and moderate tricuspid regurgitation.  TAPSE was normal 2.5.  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 cardiology in [place] in 2019 for chest pain she had a myocardial perfusion study done pharmacologically I cannot obtain the report  but there is a notation in the office visit 03/24/2018 that was normal.   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   Hyperlipidemia 05/13/2018   Hypertension    Hypertensive chronic kidney disease 05/12/2016   Hypokalemia 12/23/2020   Impingement syndrome of right shoulder 03/31/2016   Late onset Alzheimer's disease without behavioral disturbance (HCC) 04/03/2015   Leg swelling 04/28/2022   Lumbar spondylosis 11/09/2018   Metabolic bone disease 04/23/2015   Pain due to internal orthopedic prosthetic devices, implants and grafts, subsequent encounter 07/09/2017   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) 12/04/2015   Premature contractions, atrial 06/03/2020   Presence of left artificial knee joint 07/09/2017   Primary osteoarthritis of both knees 12/03/2015   Primary osteoarthritis of right knee 12/04/2015   Renal insufficiency 06/03/2020   Restless leg syndrome 08/11/2021   Tremor    Vascular dementia (HCC) 11/06/2019   Vitamin D deficiency 03/28/2015    Current Medications: No outpatient medications have been marked as taking for the 08/12/22 encounter (Appointment) with Baldo Daub, MD.      EKGs/Labs/Other Studies Reviewed:    The following studies were reviewed today:  Cardiac Studies & Procedures       ECHOCARDIOGRAM  ECHOCARDIOGRAM COMPLETE 05/18/2018  Narrative ECHOCARDIOGRAM REPORT    Patient Name:   Sharon Wise Date of Exam: 05/18/2018 Medical  Rec #:  [id number]         Height:       66.0 in Accession #:    [id number]        Weight:       150.0 lb Date of Birth:  December 19, 1950         BSA:          1.77 m Patient Age:    67 years          BP:           154/94 mmHg Patient Gender: F                 HR:           59 bpm. Exam Location:  [place]   Procedure:  2D Echo  Indications:    Chest pain syndrome [R07.9 (ICD-10-CM)]  History:        Patient has no prior history of Echocardiogram examinations. Signs/Symptoms: Shortness of Breath Risk Factors: Hypertension and Dyslipidemia.  Sonographer:    Louie Boston Referring Phys: Norman Herrlich, J  IMPRESSIONS   1. The left ventricle has normal systolic function with an ejection fraction of 60-65%. The cavity size was normal. Left ventricular diastolic Doppler parameters are consistent with impaired relaxation. 2. The right ventricle has normal systolic function. The cavity was normal. There is no increase in right ventricular wall thickness. 3. Tricuspid valve regurgitation is mild-moderate.  FINDINGS Left Ventricle: The left ventricle has normal systolic function, with an ejection fraction of 60-65%. The cavity size was normal. There is no increase in left ventricular wall thickness. Left ventricular diastolic Doppler parameters are consistent with impaired relaxation.  Right Ventricle: The right ventricle has normal systolic function. The cavity was normal. There is no increase in right ventricular wall thickness.  Left Atrium: Left atrial size was normal in size.  Right Atrium: Right atrial size was normal in size. Right atrial pressure is estimated at 10 mmHg.  Interatrial Septum: No atrial level shunt detected by color flow Doppler.  Pericardium: There is no evidence of pericardial effusion.  Mitral Valve: The mitral valve is normal in structure. Mitral valve regurgitation is trivial by color flow Doppler.  Tricuspid Valve: The tricuspid valve is normal in structure. Tricuspid valve regurgitation is mild-moderate by color flow Doppler.  Aortic Valve: The aortic valve is normal in structure. Aortic valve regurgitation was not assessed by color flow Doppler.  Pulmonic Valve: The pulmonic valve was grossly normal. Pulmonic valve regurgitation was not assessed by color flow  Doppler.  Venous: The inferior vena cava measures 1.10 cm, is normal in size with greater than 50% respiratory variability.   +--------------+--------++ LEFT VENTRICLE         +----------------+---------++ +--------------+--------++ Diastology                PLAX 2D                +----------------+---------++ +--------------+--------++ LV e' lateral:  6.96 cm/s LVIDd:        4.70 cm  +----------------+---------++ +--------------+--------++ LV E/e' lateral:7.2       LVIDs:        3.10 cm  +----------------+---------++ +--------------+--------++ LV e' medial:   6.42 cm/s LV PW:        1.30 cm  +----------------+---------++ +--------------+--------++ LV E/e' medial: 7.8       LV IVS:       1.10 cm  +----------------+---------++ +--------------+--------++ LVOT diam:    2.00 cm  +--------------+--------++  LV SV:        64 ml    +--------------+--------++ LV SV Index:  36.04    +--------------+--------++ LVOT Area:    3.14 cm +--------------+--------++                        +--------------+--------++  +---------------+----------++ RIGHT VENTRICLE           +---------------+----------++ RV S prime:    14.70 cm/s +---------------+----------++ TAPSE (M-mode):2.1 cm     +---------------+----------++ RVSP:          31.3 mmHg  +---------------+----------++  +---------------+-------++-----------++ LEFT ATRIUM           Index       +---------------+-------++-----------++ LA diam:       3.60 cm2.03 cm/m  +---------------+-------++-----------++ LA Vol (A2C):  41.8 ml23.62 ml/m +---------------+-------++-----------++ LA Vol (A4C):  29.5 ml16.67 ml/m +---------------+-------++-----------++ LA Biplane Vol:36.2 ml20.45 ml/m +---------------+-------++-----------++ +------------+---------++-----------++ RIGHT ATRIUM         Index        +------------+---------++-----------++ RA Pressure:3.00 mmHg            +------------+---------++-----------++ RA Area:    14.30 cm            +------------+---------++-----------++ RA Volume:  31.80 ml 17.97 ml/m +------------+---------++-----------++ +------------+-----------++ AORTIC VALVE            +------------+-----------++ LVOT Vmax:  68.70 cm/s  +------------+-----------++ LVOT Vmean: 45.900 cm/s +------------+-----------++ LVOT VTI:   0.172 m     +------------+-----------++  +-------------+-------++ AORTA                +-------------+-------++ Ao Root diam:3.50 cm +-------------+-------++ Ao Asc diam: 2.90 cm +-------------+-------++  +--------------+----------++ +---------------+-----------++ MITRAL VALVE             TRICUSPID VALVE            +--------------+----------++ +---------------+-----------++ MV Area (PHT):2.80 cm   TR Peak grad:  28.3 mmHg   +--------------+----------++ +---------------+-----------++ MV PHT:       78.59 msec TR Vmax:       293.00 cm/s +--------------+----------++ +---------------+-----------++ MV Decel Time:271 msec   Estimated RAP: 3.00 mmHg   +--------------+----------++ +---------------+-----------++ +--------------+----------++ RVSP:          31.3 mmHg   MV E velocity:50.00 cm/s +---------------+-----------++ +--------------+----------++ MV A velocity:73.20 cm/s +--------------+-------+ +--------------+----------++ SHUNTS                MV E/A ratio: 0.68       +--------------+-------+ +--------------+----------++ Systemic VTI: 0.17 m  +--------------+-------+ Systemic Diam:2.00 cm +--------------+-------+  +---------+-------+ IVC              +---------+-------+ IVC diam:1.10 cm +---------+-------+   Gypsy Balsam MD Electronically signed by Gypsy Balsam MD Signature Date/Time: 05/18/2018/12:50:06  PM    Final     CT SCANS  CT CORONARY MORPH W/CTA COR W/SCORE 07/25/2018  Addendum 07/25/2018  1:25 PM ADDENDUM REPORT: 07/25/2018 13:23  CLINICAL DATA:  Chest pain  EXAM: Cardiac CTA  MEDICATIONS: Sub lingual nitro. 4mg  x 2  TECHNIQUE: The patient was scanned on a Siemens 192 slice scanner. Gantry rotation speed was 250 msecs. Collimation was 0.6 mm. A 100 kV prospective scan was triggered in the ascending thoracic aorta at 35-75% of the R-R interval. Average HR during the scan was 60 bpm. The 3D data set was interpreted on a dedicated work station using MPR, MIP and VRT modes. A total of 80cc of contrast was used.  FINDINGS: Non-cardiac: See separate report  from [facility].  Pulmonary veins drain normally to the left atrium.  Calcium Score: 0.4 Agatston units.  Coronary Arteries: Right dominant with no anomalies  LM: No plaque or stenosis.  LAD system:  No plaque or stenosis.  Circumflex system: No plaque or stenosis.  RCA system: There is misregistration artifact but it does not affect the ability to read the study. Mixed plaque proximal RCA with minimal stenosis  IMPRESSION: 1. Coronary artery calcium score 0.4 Agatston units places the patient in the 43rd percentile for age and gender, suggesting intermediate risk for future cardiac events.  2.  No obstructive CAD.  Dalton Mclean   Electronically Signed By: Marca Ancona M.D. On: 07/25/2018 13:23  Narrative EXAM: OVER-READ INTERPRETATION  CT CHEST  The following report is an over-read performed by radiologist Dr. Jeronimo Greaves of [facility] on 07/25/2018. This over-read does not include interpretation of cardiac or coronary anatomy or pathology. The coronary CTA interpretation by the cardiologist is attached.  COMPARISON:  06/16/2018 CTA of the chest.  FINDINGS: Vascular: Normal aortic caliber, without dissection. No central pulmonary embolism, on this non-dedicated  study.  Mediastinum/Nodes: No imaged thoracic adenopathy.  Lungs/Pleura: No imaged pleural fluid. A 2 mm right lower lobe pulmonary nodule was present on 07/29/2014 and can be presumed benign.  Left lung base scarring or atelectasis.  Upper Abdomen: Normal imaged portions of the liver, spleen, stomach.  Musculoskeletal: Midthoracic spondylosis.  IMPRESSION: No acute extracardiac findings in the imaged chest.  Electronically Signed: By: Jeronimo Greaves M.D. On: 07/25/2018 08:46              Recent Labs: No results found for requested labs within last 365 days.  Recent Lipid Panel    Component Value Date/Time   CHOL  05/01/2008 0810    155        ATP III CLASSIFICATION:  <200     mg/dL   Desirable  098-119  mg/dL   Borderline High  >=147    mg/dL   High          TRIG 829 05/01/2008 1826   HDL 55 05/01/2008 0810   CHOLHDL 2.8 05/01/2008 0810   VLDL 22 05/01/2008 0810   LDLCALC  05/01/2008 0810    78        Total Cholesterol/HDL:CHD Risk Coronary Heart Disease Risk Table                     Men   Women  1/2 Average Risk   3.4   3.3  Average Risk       5.0   4.4  2 X Average Risk   9.6   7.1  3 X Average Risk  23.4   11.0        Use the calculated Patient Ratio above and the CHD Risk Table to determine the patient's CHD Risk.        ATP III CLASSIFICATION (LDL):  <100     mg/dL   Optimal  562-130  mg/dL   Near or Above                    Optimal  130-159  mg/dL   Borderline  865-784  mg/dL   High  >696     mg/dL   Very High    Physical Exam:    VS:  LMP  (LMP Unknown)     Wt Readings from Last 3 Encounters:  06/03/20 166 lb  12.8 oz (75.7 kg)  06/14/19 169 lb 3.2 oz (76.7 kg)  03/17/19 168 lb (76.2 kg)     GEN: *** Well nourished, well developed in no acute distress HEENT: Normal NECK: No JVD; No carotid bruits LYMPHATICS: No lymphadenopathy CARDIAC: ***RRR, no murmurs, rubs, gallops RESPIRATORY:  Clear to auscultation without rales, wheezing or  rhonchi  ABDOMEN: Soft, non-tender, non-distended MUSCULOSKELETAL:  No edema; No deformity  SKIN: Warm and dry NEUROLOGIC:  Alert and oriented x 3 PSYCHIATRIC:  Normal affect    Signed, Norman Herrlich, MD  08/11/2022 10:00 AM     [facility]

## 2022-08-12 ENCOUNTER — Ambulatory Visit: Payer: 59 | Attending: Cardiology | Admitting: Cardiology

## 2022-08-12 ENCOUNTER — Encounter: Payer: Self-pay | Admitting: Cardiology

## 2022-08-12 ENCOUNTER — Ambulatory Visit: Payer: 59 | Admitting: Cardiology

## 2022-08-12 VITALS — BP 116/70 | HR 72 | Ht 62.0 in | Wt 151.2 lb

## 2022-08-12 DIAGNOSIS — I491 Atrial premature depolarization: Secondary | ICD-10-CM | POA: Diagnosis not present

## 2022-08-12 DIAGNOSIS — R079 Chest pain, unspecified: Secondary | ICD-10-CM | POA: Diagnosis not present

## 2022-08-12 DIAGNOSIS — M4322 Fusion of spine, cervical region: Secondary | ICD-10-CM | POA: Diagnosis not present

## 2022-08-12 DIAGNOSIS — M542 Cervicalgia: Secondary | ICD-10-CM | POA: Diagnosis not present

## 2022-08-12 DIAGNOSIS — F01A0 Vascular dementia, mild, without behavioral disturbance, psychotic disturbance, mood disturbance, and anxiety: Secondary | ICD-10-CM | POA: Diagnosis not present

## 2022-08-12 DIAGNOSIS — I2510 Atherosclerotic heart disease of native coronary artery without angina pectoris: Secondary | ICD-10-CM

## 2022-08-12 DIAGNOSIS — E782 Mixed hyperlipidemia: Secondary | ICD-10-CM | POA: Diagnosis not present

## 2022-08-12 DIAGNOSIS — I10 Essential (primary) hypertension: Secondary | ICD-10-CM

## 2022-08-12 DIAGNOSIS — M47816 Spondylosis without myelopathy or radiculopathy, lumbar region: Secondary | ICD-10-CM | POA: Diagnosis not present

## 2022-08-12 DIAGNOSIS — M545 Low back pain, unspecified: Secondary | ICD-10-CM | POA: Diagnosis not present

## 2022-08-12 DIAGNOSIS — G252 Other specified forms of tremor: Secondary | ICD-10-CM | POA: Diagnosis not present

## 2022-08-12 NOTE — Patient Instructions (Signed)
Medication Instructions:  Your physician recommends that you continue on your current medications as directed. Please refer to the Current Medication list given to you today.  *If you need a refill on your cardiac medications before your next appointment, please call your pharmacy*   Lab Work: None If you have labs (blood work) drawn today and your tests are completely normal, you will receive your results only by: MyChart Message (if you have MyChart) OR A paper copy in the mail If you have any lab test that is abnormal or we need to change your treatment, we will call you to review the results.   Testing/Procedures: None   Follow-Up: At [facility], you and your health needs are our priority.  As part of our continuing mission to provide you with exceptional heart care, we have created designated Provider Care Teams.  These Care Teams include your primary Cardiologist (physician) and Advanced Practice Providers (APPs -  Physician Assistants and Nurse Practitioners) who all work together to provide you with the care you need, when you need it.  We recommend signing up for the patient portal called "MyChart".  Sign up information is provided on this After Visit Summary.  MyChart is used to connect with patients for Virtual Visits (Telemedicine).  Patients are able to view lab/test results, encounter notes, upcoming appointments, etc.  Non-urgent messages can be sent to your provider as well.   To learn more about what you can do with MyChart, go to [email or web address].    Your next appointment:   Follow up as needed  Provider:   Brian Munley, MD    Other Instructions None  

## 2022-08-12 NOTE — Progress Notes (Signed)
Cardiology Office Note:    Date:  08/12/2022   ID:  Sharon Wise, DOB March 29, 1950, MRN [id number]  PCP:  Paulina Fusi, MD  Cardiologist:  Norman Herrlich, MD    Referring MD: Paulina Fusi, MD    ASSESSMENT:    1. Atherosclerosis of native coronary artery of native heart without angina pectoris   2. Primary hypertension   3. Mixed hyperlipidemia   4. Premature contractions, atrial    PLAN:    In order of problems listed above:  Her cardiology perspective she is doing quite well and no contraindication or restrictions on a driver's license. Blood pressure are well-controlled continue her current treatment including the beta-blocker Continue her statin lipids are at target She has APCs no history of atrial fibrillation asymptomatic continue her beta-blocker and again no restriction to operating a motor vehicle    Next appointment: As needed   Medication Adjustments/Labs and Tests Ordered: Current medicines are reviewed at length with the patient today.  Concerns regarding medicines are outlined above.  Orders Placed This Encounter  Procedures   EKG 12-Lead   No orders of the defined types were placed in this encounter.    History of Present Illness:     Sharon Wise is a 72 y.o. female with a hx of chest pain with a coronary calcium score of 0 and minimal coronary atherosclerosis hypertension stage III CKD hyperlipidemia last seen more than 3 years ago 06/14/2019.  He was referred back to the office for complaints of palpitation unfortunately office notes are not available.  She had an EKG at [facility] 05/06/2022 showing sinus rhythm sinus arrhythmia was normal EKG.  She had an echocardiogram done at [facility] 03/30/2022 showing ventricular size wall thickness systolic function the right ventricle is moderately enlarged with preserved systolic function left atrium mildly enlarged right atrium moderately enlarged there is elevation of pulmonary  artery systolic pressure mildly and moderate tricuspid regurgitation.  TAPSE was normal 2.5.   With cardiology perspective she continues to do well her cardiac CTA showed minimal coronary atherosclerosis and a coronary calcium score of 0.  Her hypertension is controlled on her current medications she is having no cardiovascular symptoms of angina shortness of breath palpitation or syncope.  Recent labs with her PCP shows a cholesterol 194 LDL 85 A1c 6.3 creatinine 2.53 potassium 3.7  Medications reviewed from a cardiology perspective she takes a beta-blocker metoprolol she takes a diuretic and statin.  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   Hyperlipidemia 05/13/2018   Hypertension    Hypertensive chronic kidney disease 05/12/2016   Hypokalemia 12/23/2020   Impingement syndrome of right shoulder 03/31/2016   Late onset Alzheimer's disease without behavioral disturbance (HCC) 04/03/2015   Leg swelling 04/28/2022  Lumbar spondylosis 11/09/2018   Metabolic bone disease 04/23/2015   Pain due to internal orthopedic prosthetic devices, implants and grafts, subsequent encounter 07/09/2017   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) 12/04/2015   Premature contractions, atrial 06/03/2020    Presence of left artificial knee joint 07/09/2017   Primary osteoarthritis of both knees 12/03/2015   Primary osteoarthritis of right knee 12/04/2015   Renal insufficiency 06/03/2020   Restless leg syndrome 08/11/2021   Tremor    Vascular dementia (HCC) 11/06/2019   Vitamin D deficiency 03/28/2015    Current Medications: Current Meds  Medication Sig   ADVAIR HFA 230-21 MCG/ACT inhaler Inhale 1 puff into the lungs every 12 (twelve) hours.   allopurinol (ZYLOPRIM) 100 MG tablet Take 100 mg by mouth daily.   busPIRone (BUSPAR) 7.5 MG tablet Take 7.5 mg by mouth 3 (three) times daily as needed (anxiety).   cetirizine (ZYRTEC) 10 MG tablet Take 10 mg by mouth daily.   Cholecalciferol 50 MCG (2000 UT) TABS Take 1 tablet by mouth daily.   citalopram (CELEXA) 20 MG tablet Take 20 mg by mouth daily.   clonazePAM (KLONOPIN) 0.5 MG tablet Take 0.5 mg by mouth 2 (two) times daily.    donepezil (ARICEPT ODT) 10 MG disintegrating tablet Take 10 mg by mouth daily.   doxycycline (VIBRAMYCIN) 100 MG capsule Take 100 mg by mouth 2 (two) times daily.   famotidine (PEPCID) 40 MG tablet Take 40 mg by mouth daily.   FEROSUL 325 (65 Fe) MG tablet Take 325 mg by mouth daily.   fluticasone (FLONASE) 50 MCG/ACT nasal spray Place 1 spray into both nostrils 2 (two) times daily.   hydrALAZINE (APRESOLINE) 25 MG tablet Take 0.5 tablets (12.5 mg total) by mouth 3 (three) times daily. Patient needs an appointment for further refills. 3 rd/ final attempt   isosorbide mononitrate (IMDUR) 30 MG 24 hr tablet Take 1 tablet (30 mg total) by mouth daily.   ketoconazole (NIZORAL) 2 % cream Apply 1 fingertip amount to each foot daily.   losartan (COZAAR) 25 MG tablet Take 25 mg by mouth daily.   memantine (NAMENDA) 10 MG tablet Take 10 mg by mouth daily.   metoprolol tartrate (LOPRESSOR) 25 MG tablet TAKE ONE TABLET BY MOUTH TWICE DAILY   nitroGLYCERIN (NITROSTAT) 0.4 MG SL tablet Place 0.4 mg under the tongue every 5  (five) minutes as needed for chest pain.   omeprazole (PRILOSEC) 20 MG capsule Take 20 mg by mouth daily.   oxybutynin (DITROPAN-XL) 5 MG 24 hr tablet Take 5 mg by mouth daily.   pantoprazole (PROTONIX) 40 MG tablet Take 40 mg by mouth daily.   potassium chloride SA (K-DUR) 20 MEQ tablet Take 20 mEq by mouth every evening.   pramipexole (MIRAPEX) 1 MG tablet Take 1 mg by mouth at bedtime.   pravastatin (PRAVACHOL) 40 MG tablet Take 20 mg by mouth daily.    predniSONE (DELTASONE) 20 MG tablet Take 20 mg by mouth 2 (two) times daily.   torsemide (DEMADEX) 20 MG tablet Take 20 mg by mouth daily.   traZODone (DESYREL) 50 MG tablet Take 50 mg by mouth at bedtime.   triamcinolone ointment (KENALOG) 0.1 % Apply 1 application topically as needed (rash).   Wheat Dextrin (BENEFIBER) POWD Take 1 (one) tablespoon(s) daily      EKGs/Labs/Other Studies Reviewed:    The following studies were reviewed today:  Cardiac Studies & Procedures       ECHOCARDIOGRAM  ECHOCARDIOGRAM COMPLETE  05/18/2018  Narrative ECHOCARDIOGRAM REPORT    Patient Name:   KEILAH LEMIRE Date of Exam: 05/18/2018 Medical Rec #:  [id number]         Height:       66.0 in Accession #:    [id number]        Weight:       150.0 lb Date of Birth:  12-20-1950         BSA:          1.77 m Patient Age:    67 years          BP:           154/94 mmHg Patient Gender: F                 HR:           59 bpm. Exam Location:  [facility]   Procedure: 2D Echo  Indications:    Chest pain syndrome [R07.9 (ICD-10-CM)]  History:        Patient has no prior history of Echocardiogram examinations. Signs/Symptoms: Shortness of Breath Risk Factors: Hypertension and Dyslipidemia.  Sonographer:    Louie Boston Referring Phys: Norman Herrlich, J  IMPRESSIONS   1. The left ventricle has normal systolic function with an ejection fraction of 60-65%. The cavity size was normal. Left ventricular diastolic Doppler parameters are consistent with  impaired relaxation. 2. The right ventricle has normal systolic function. The cavity was normal. There is no increase in right ventricular wall thickness. 3. Tricuspid valve regurgitation is mild-moderate.  FINDINGS Left Ventricle: The left ventricle has normal systolic function, with an ejection fraction of 60-65%. The cavity size was normal. There is no increase in left ventricular wall thickness. Left ventricular diastolic Doppler parameters are consistent with impaired relaxation.  Right Ventricle: The right ventricle has normal systolic function. The cavity was normal. There is no increase in right ventricular wall thickness.  Left Atrium: Left atrial size was normal in size.  Right Atrium: Right atrial size was normal in size. Right atrial pressure is estimated at 10 mmHg.  Interatrial Septum: No atrial level shunt detected by color flow Doppler.  Pericardium: There is no evidence of pericardial effusion.  Mitral Valve: The mitral valve is normal in structure. Mitral valve regurgitation is trivial by color flow Doppler.  Tricuspid Valve: The tricuspid valve is normal in structure. Tricuspid valve regurgitation is mild-moderate by color flow Doppler.  Aortic Valve: The aortic valve is normal in structure. Aortic valve regurgitation was not assessed by color flow Doppler.  Pulmonic Valve: The pulmonic valve was grossly normal. Pulmonic valve regurgitation was not assessed by color flow Doppler.  Venous: The inferior vena cava measures 1.10 cm, is normal in size with greater than 50% respiratory variability.   +--------------+--------++ LEFT VENTRICLE         +----------------+---------++ +--------------+--------++ Diastology                PLAX 2D                +----------------+---------++ +--------------+--------++ LV e' lateral:  6.96 cm/s LVIDd:        4.70 cm  +----------------+---------++ +--------------+--------++ LV E/e' lateral:7.2        LVIDs:        3.10 cm  +----------------+---------++ +--------------+--------++ LV e' medial:   6.42 cm/s LV PW:        1.30 cm  +----------------+---------++ +--------------+--------++ LV E/e' medial: 7.8       LV  IVS:       1.10 cm  +----------------+---------++ +--------------+--------++ LVOT diam:    2.00 cm  +--------------+--------++ LV SV:        64 ml    +--------------+--------++ LV SV Index:  36.04    +--------------+--------++ LVOT Area:    3.14 cm +--------------+--------++                        +--------------+--------++  +---------------+----------++ RIGHT VENTRICLE           +---------------+----------++ RV S prime:    14.70 cm/s +---------------+----------++ TAPSE (M-mode):2.1 cm     +---------------+----------++ RVSP:          31.3 mmHg  +---------------+----------++  +---------------+-------++-----------++ LEFT ATRIUM           Index       +---------------+-------++-----------++ LA diam:       3.60 cm2.03 cm/m  +---------------+-------++-----------++ LA Vol (A2C):  41.8 ml23.62 ml/m +---------------+-------++-----------++ LA Vol (A4C):  29.5 ml16.67 ml/m +---------------+-------++-----------++ LA Biplane Vol:36.2 ml20.45 ml/m +---------------+-------++-----------++ +------------+---------++-----------++ RIGHT ATRIUM         Index       +------------+---------++-----------++ RA Pressure:3.00 mmHg            +------------+---------++-----------++ RA Area:    14.30 cm            +------------+---------++-----------++ RA Volume:  31.80 ml 17.97 ml/m +------------+---------++-----------++ +------------+-----------++ AORTIC VALVE            +------------+-----------++ LVOT Vmax:  68.70 cm/s  +------------+-----------++ LVOT Vmean: 45.900 cm/s +------------+-----------++ LVOT VTI:   0.172 m      +------------+-----------++  +-------------+-------++ AORTA                +-------------+-------++ Ao Root diam:3.50 cm +-------------+-------++ Ao Asc diam: 2.90 cm +-------------+-------++  +--------------+----------++ +---------------+-----------++ MITRAL VALVE             TRICUSPID VALVE            +--------------+----------++ +---------------+-----------++ MV Area (PHT):2.80 cm   TR Peak grad:  28.3 mmHg   +--------------+----------++ +---------------+-----------++ MV PHT:       78.59 msec TR Vmax:       293.00 cm/s +--------------+----------++ +---------------+-----------++ MV Decel Time:271 msec   Estimated RAP: 3.00 mmHg   +--------------+----------++ +---------------+-----------++ +--------------+----------++ RVSP:          31.3 mmHg   MV E velocity:50.00 cm/s +---------------+-----------++ +--------------+----------++ MV A velocity:73.20 cm/s +--------------+-------+ +--------------+----------++ SHUNTS                MV E/A ratio: 0.68       +--------------+-------+ +--------------+----------++ Systemic VTI: 0.17 m  +--------------+-------+ Systemic Diam:2.00 cm +--------------+-------+  +---------+-------+ IVC              +---------+-------+ IVC diam:1.10 cm +---------+-------+   Gypsy Balsam MD Electronically signed by Gypsy Balsam MD Signature Date/Time: 05/18/2018/12:50:06 PM    Final     CT SCANS  CT CORONARY MORPH W/CTA COR W/SCORE 07/25/2018  Addendum 07/25/2018  1:25 PM ADDENDUM REPORT: 07/25/2018 13:23  CLINICAL DATA:  Chest pain  EXAM: Cardiac CTA  MEDICATIONS: Sub lingual nitro. 4mg  x 2  TECHNIQUE: The patient was scanned on a Siemens 192 slice scanner. Gantry rotation speed was 250 msecs. Collimation was 0.6 mm. A 100 kV prospective scan was triggered in the ascending thoracic aorta at 35-75% of the R-R interval. Average HR during the scan was 60  bpm. The 3D data set was interpreted on a dedicated work  station using MPR, MIP and VRT modes. A total of 80cc of contrast was used.  FINDINGS: Non-cardiac: See separate report from [facility].  Pulmonary veins drain normally to the left atrium.  Calcium Score: 0.4 Agatston units.  Coronary Arteries: Right dominant with no anomalies  LM: No plaque or stenosis.  LAD system:  No plaque or stenosis.  Circumflex system: No plaque or stenosis.  RCA system: There is misregistration artifact but it does not affect the ability to read the study. Mixed plaque proximal RCA with minimal stenosis  IMPRESSION: 1. Coronary artery calcium score 0.4 Agatston units places the patient in the 43rd percentile for age and gender, suggesting intermediate risk for future cardiac events.  2.  No obstructive CAD.  Dalton Mclean   Electronically Signed By: Marca Ancona M.D. On: 07/25/2018 13:23  Narrative EXAM: OVER-READ INTERPRETATION  CT CHEST  The following report is an over-read performed by radiologist Dr. Jeronimo Greaves of [facility] on 07/25/2018. This over-read does not include interpretation of cardiac or coronary anatomy or pathology. The coronary CTA interpretation by the cardiologist is attached.  COMPARISON:  06/16/2018 CTA of the chest.  FINDINGS: Vascular: Normal aortic caliber, without dissection. No central pulmonary embolism, on this non-dedicated study.  Mediastinum/Nodes: No imaged thoracic adenopathy.  Lungs/Pleura: No imaged pleural fluid. A 2 mm right lower lobe pulmonary nodule was present on 07/29/2014 and can be presumed benign.  Left lung base scarring or atelectasis.  Upper Abdomen: Normal imaged portions of the liver, spleen, stomach.  Musculoskeletal: Midthoracic spondylosis.  IMPRESSION: No acute extracardiac findings in the imaged chest.  Electronically Signed: By: Jeronimo Greaves M.D. On: 07/25/2018 08:46          EKG  Interpretation Date/Time:  Wednesday August 12 2022 15:55:37 EDT Ventricular Rate:  72 PR Interval:  182 QRS Duration:  72 QT Interval:  414 QTC Calculation: 453 R Axis:   57  Text Interpretation: Sinus rhythm with marked sinus arrhythmia with Premature supraventricular complexes When compared with ECG of 01-May-2008 08:07, Premature supraventricular complexes are now Present Questionable change in QRS axis Nonspecific T wave abnormality now evident in Anterior leads Confirmed by Norman Herrlich ([id number]) on 08/12/2022 4:13:37 PM   Recent Labs: No results found for requested labs within last 365 days.  Recent Lipid Panel    Component Value Date/Time   CHOL  05/01/2008 0810    155        ATP III CLASSIFICATION:  <200     mg/dL   Desirable  425-956  mg/dL   Borderline High  >=387    mg/dL   High          TRIG 564 05/01/2008 1826   HDL 55 05/01/2008 0810   CHOLHDL 2.8 05/01/2008 0810   VLDL 22 05/01/2008 0810   LDLCALC  05/01/2008 0810    78        Total Cholesterol/HDL:CHD Risk Coronary Heart Disease Risk Table                     Men   Women  1/2 Average Risk   3.4   3.3  Average Risk       5.0   4.4  2 X Average Risk   9.6   7.1  3 X Average Risk  23.4   11.0        Use the calculated Patient Ratio above and the CHD Risk Table to determine the patient's CHD Risk.  ATP III CLASSIFICATION (LDL):  <100     mg/dL   Optimal  578-469  mg/dL   Near or Above                    Optimal  130-159  mg/dL   Borderline  629-528  mg/dL   High  >413     mg/dL   Very High    Physical Exam:    VS:  BP 116/70   Pulse 72   Ht 5\' 2"  (1.575 m)   Wt 151 lb 3.2 oz (68.6 kg)   LMP  (LMP Unknown)   SpO2 97%   BMI 27.65 kg/m     Wt Readings from Last 3 Encounters:  08/12/22 151 lb 3.2 oz (68.6 kg)  06/03/20 166 lb 12.8 oz (75.7 kg)  06/14/19 169 lb 3.2 oz (76.7 kg)     GEN:  Well nourished, well developed in no acute distress HEENT: Normal NECK: No JVD; No carotid  bruits LYMPHATICS: No lymphadenopathy CARDIAC: RRR, no murmurs, rubs, gallops RESPIRATORY:  Clear to auscultation without rales, wheezing or rhonchi  ABDOMEN: Soft, non-tender, non-distended MUSCULOSKELETAL:  No edema; No deformity  SKIN: Warm and dry NEUROLOGIC:  Alert and oriented x 3 PSYCHIATRIC:  Normal affect    Signed, Norman Herrlich, MD  08/12/2022 4:23 PM    [facility]

## 2022-08-19 DIAGNOSIS — E119 Type 2 diabetes mellitus without complications: Secondary | ICD-10-CM | POA: Diagnosis not present

## 2022-08-25 DIAGNOSIS — M545 Low back pain, unspecified: Secondary | ICD-10-CM | POA: Diagnosis not present

## 2022-08-25 DIAGNOSIS — M542 Cervicalgia: Secondary | ICD-10-CM | POA: Diagnosis not present

## 2022-08-25 DIAGNOSIS — M4322 Fusion of spine, cervical region: Secondary | ICD-10-CM | POA: Diagnosis not present

## 2022-08-27 DIAGNOSIS — M4322 Fusion of spine, cervical region: Secondary | ICD-10-CM | POA: Diagnosis not present

## 2022-08-27 DIAGNOSIS — M545 Low back pain, unspecified: Secondary | ICD-10-CM | POA: Diagnosis not present

## 2022-08-27 DIAGNOSIS — M542 Cervicalgia: Secondary | ICD-10-CM | POA: Diagnosis not present

## 2022-08-31 DIAGNOSIS — M545 Low back pain, unspecified: Secondary | ICD-10-CM | POA: Diagnosis not present

## 2022-08-31 DIAGNOSIS — M542 Cervicalgia: Secondary | ICD-10-CM | POA: Diagnosis not present

## 2022-08-31 DIAGNOSIS — M4322 Fusion of spine, cervical region: Secondary | ICD-10-CM | POA: Diagnosis not present

## 2022-09-01 DIAGNOSIS — M5116 Intervertebral disc disorders with radiculopathy, lumbar region: Secondary | ICD-10-CM | POA: Diagnosis not present

## 2022-09-01 DIAGNOSIS — N183 Chronic kidney disease, stage 3 unspecified: Secondary | ICD-10-CM | POA: Diagnosis not present

## 2022-09-01 DIAGNOSIS — M47816 Spondylosis without myelopathy or radiculopathy, lumbar region: Secondary | ICD-10-CM | POA: Diagnosis not present

## 2022-09-01 DIAGNOSIS — M4310 Spondylolisthesis, site unspecified: Secondary | ICD-10-CM | POA: Diagnosis not present

## 2022-09-01 DIAGNOSIS — I10 Essential (primary) hypertension: Secondary | ICD-10-CM | POA: Diagnosis not present

## 2022-09-01 DIAGNOSIS — M5442 Lumbago with sciatica, left side: Secondary | ICD-10-CM | POA: Diagnosis not present

## 2022-09-01 DIAGNOSIS — M419 Scoliosis, unspecified: Secondary | ICD-10-CM | POA: Diagnosis not present

## 2022-09-03 DIAGNOSIS — M4322 Fusion of spine, cervical region: Secondary | ICD-10-CM | POA: Diagnosis not present

## 2022-09-03 DIAGNOSIS — M542 Cervicalgia: Secondary | ICD-10-CM | POA: Diagnosis not present

## 2022-09-03 DIAGNOSIS — M545 Low back pain, unspecified: Secondary | ICD-10-CM | POA: Diagnosis not present

## 2022-09-08 ENCOUNTER — Telehealth: Payer: Self-pay

## 2022-09-08 NOTE — Telephone Encounter (Signed)
Transition Care Management Unsuccessful Follow-up Telephone Call  Date of discharge and from where:  09/01/2022 [facility]  Attempts:  1st Attempt  Reason for unsuccessful TCM follow-up call:  Left voice message  Carliss Porcaro [facility]  [facility] Population Health Community Resource Care Guide   [email or web address]  ?? [phone]   Website: [email or web address]  [email or web address]

## 2022-09-09 ENCOUNTER — Telehealth: Payer: Self-pay

## 2022-09-09 NOTE — Telephone Encounter (Signed)
Transition Care Management Unsuccessful Follow-up Telephone Call  Date of discharge and from where:  09/01/2022 [facility]  Attempts:  2nd Attempt  Reason for unsuccessful TCM follow-up call:  Left voice message  Mariene Dickerman [facility]  [facility] Population Health Community Resource Care Guide   [email or web address]  ?? [phone]   Website: [email or web address]  [email or web address]

## 2022-09-10 DIAGNOSIS — M545 Low back pain, unspecified: Secondary | ICD-10-CM | POA: Diagnosis not present

## 2022-09-10 DIAGNOSIS — M4322 Fusion of spine, cervical region: Secondary | ICD-10-CM | POA: Diagnosis not present

## 2022-09-10 DIAGNOSIS — M542 Cervicalgia: Secondary | ICD-10-CM | POA: Diagnosis not present

## 2022-09-14 DIAGNOSIS — Z1152 Encounter for screening for COVID-19: Secondary | ICD-10-CM | POA: Diagnosis not present

## 2022-09-14 DIAGNOSIS — E860 Dehydration: Secondary | ICD-10-CM | POA: Diagnosis not present

## 2022-09-14 DIAGNOSIS — A0811 Acute gastroenteropathy due to Norwalk agent: Secondary | ICD-10-CM | POA: Diagnosis not present

## 2022-09-14 DIAGNOSIS — R109 Unspecified abdominal pain: Secondary | ICD-10-CM | POA: Diagnosis not present

## 2022-09-14 DIAGNOSIS — Z90710 Acquired absence of both cervix and uterus: Secondary | ICD-10-CM | POA: Diagnosis not present

## 2022-09-17 DIAGNOSIS — M47816 Spondylosis without myelopathy or radiculopathy, lumbar region: Secondary | ICD-10-CM | POA: Diagnosis not present

## 2022-09-25 ENCOUNTER — Telehealth: Payer: Self-pay

## 2022-09-25 NOTE — Telephone Encounter (Signed)
Transition Care Management Unsuccessful Follow-up Telephone Call  Date of discharge and from where:  09/14/2022 [facility]  Attempts:  1st Attempt  Reason for unsuccessful TCM follow-up call:  Left voice message  Arturo Sofranko [facility]  [facility] Population Health Community Resource Care Guide   [email or web address]  ?? [phone]   Website: [email or web address]  [email or web address]

## 2022-09-26 DIAGNOSIS — N183 Chronic kidney disease, stage 3 unspecified: Secondary | ICD-10-CM | POA: Diagnosis not present

## 2022-09-26 DIAGNOSIS — R296 Repeated falls: Secondary | ICD-10-CM | POA: Diagnosis not present

## 2022-09-26 DIAGNOSIS — J45909 Unspecified asthma, uncomplicated: Secondary | ICD-10-CM | POA: Diagnosis not present

## 2022-09-26 DIAGNOSIS — I951 Orthostatic hypotension: Secondary | ICD-10-CM | POA: Diagnosis not present

## 2022-09-26 DIAGNOSIS — Z7952 Long term (current) use of systemic steroids: Secondary | ICD-10-CM | POA: Diagnosis not present

## 2022-09-26 DIAGNOSIS — E7800 Pure hypercholesterolemia, unspecified: Secondary | ICD-10-CM | POA: Diagnosis not present

## 2022-09-26 DIAGNOSIS — N179 Acute kidney failure, unspecified: Secondary | ICD-10-CM | POA: Diagnosis not present

## 2022-09-26 DIAGNOSIS — R55 Syncope and collapse: Secondary | ICD-10-CM | POA: Diagnosis not present

## 2022-09-26 DIAGNOSIS — I129 Hypertensive chronic kidney disease with stage 1 through stage 4 chronic kidney disease, or unspecified chronic kidney disease: Secondary | ICD-10-CM | POA: Diagnosis not present

## 2022-09-26 DIAGNOSIS — I498 Other specified cardiac arrhythmias: Secondary | ICD-10-CM | POA: Diagnosis not present

## 2022-09-26 DIAGNOSIS — R9431 Abnormal electrocardiogram [ECG] [EKG]: Secondary | ICD-10-CM | POA: Diagnosis not present

## 2022-09-26 DIAGNOSIS — Z888 Allergy status to other drugs, medicaments and biological substances status: Secondary | ICD-10-CM | POA: Diagnosis not present

## 2022-09-26 DIAGNOSIS — K219 Gastro-esophageal reflux disease without esophagitis: Secondary | ICD-10-CM | POA: Diagnosis not present

## 2022-09-26 DIAGNOSIS — R531 Weakness: Secondary | ICD-10-CM | POA: Diagnosis not present

## 2022-09-26 DIAGNOSIS — D649 Anemia, unspecified: Secondary | ICD-10-CM | POA: Diagnosis not present

## 2022-09-26 DIAGNOSIS — M533 Sacrococcygeal disorders, not elsewhere classified: Secondary | ICD-10-CM | POA: Diagnosis not present

## 2022-09-26 DIAGNOSIS — Z79899 Other long term (current) drug therapy: Secondary | ICD-10-CM | POA: Diagnosis not present

## 2022-09-26 DIAGNOSIS — G8929 Other chronic pain: Secondary | ICD-10-CM | POA: Diagnosis not present

## 2022-09-26 DIAGNOSIS — G629 Polyneuropathy, unspecified: Secondary | ICD-10-CM | POA: Diagnosis not present

## 2022-09-26 DIAGNOSIS — Z981 Arthrodesis status: Secondary | ICD-10-CM | POA: Diagnosis not present

## 2022-09-26 DIAGNOSIS — U071 COVID-19: Secondary | ICD-10-CM | POA: Diagnosis not present

## 2022-09-26 DIAGNOSIS — R079 Chest pain, unspecified: Secondary | ICD-10-CM | POA: Diagnosis not present

## 2022-09-26 DIAGNOSIS — D509 Iron deficiency anemia, unspecified: Secondary | ICD-10-CM | POA: Diagnosis not present

## 2022-09-26 DIAGNOSIS — E860 Dehydration: Secondary | ICD-10-CM | POA: Diagnosis not present

## 2022-09-27 DIAGNOSIS — N179 Acute kidney failure, unspecified: Secondary | ICD-10-CM | POA: Diagnosis not present

## 2022-09-27 DIAGNOSIS — U071 COVID-19: Secondary | ICD-10-CM | POA: Diagnosis not present

## 2022-09-27 DIAGNOSIS — I951 Orthostatic hypotension: Secondary | ICD-10-CM | POA: Diagnosis not present

## 2022-09-28 ENCOUNTER — Telehealth: Payer: Self-pay

## 2022-09-28 NOTE — Telephone Encounter (Signed)
Transition Care Management Unsuccessful Follow-up Telephone Call  Date of discharge and from where:  09/14/2022 [facility]  Attempts:  2nd Attempt  Reason for unsuccessful TCM follow-up call:  Left voice message  Britny Riel [facility]  [facility] Population Health Community Resource Care Guide   [email or web address]  ?? [phone]   Website: [email or web address]

## 2022-09-29 DIAGNOSIS — K219 Gastro-esophageal reflux disease without esophagitis: Secondary | ICD-10-CM | POA: Diagnosis not present

## 2022-09-29 DIAGNOSIS — G309 Alzheimer's disease, unspecified: Secondary | ICD-10-CM | POA: Diagnosis not present

## 2022-09-29 DIAGNOSIS — M4722 Other spondylosis with radiculopathy, cervical region: Secondary | ICD-10-CM | POA: Diagnosis not present

## 2022-09-29 DIAGNOSIS — M5011 Cervical disc disorder with radiculopathy,  high cervical region: Secondary | ICD-10-CM | POA: Diagnosis not present

## 2022-09-29 DIAGNOSIS — G4700 Insomnia, unspecified: Secondary | ICD-10-CM | POA: Diagnosis not present

## 2022-09-29 DIAGNOSIS — G4762 Sleep related leg cramps: Secondary | ICD-10-CM | POA: Diagnosis not present

## 2022-09-29 DIAGNOSIS — M109 Gout, unspecified: Secondary | ICD-10-CM | POA: Diagnosis not present

## 2022-09-29 DIAGNOSIS — N179 Acute kidney failure, unspecified: Secondary | ICD-10-CM | POA: Diagnosis not present

## 2022-09-29 DIAGNOSIS — G2581 Restless legs syndrome: Secondary | ICD-10-CM | POA: Diagnosis not present

## 2022-09-29 DIAGNOSIS — E1169 Type 2 diabetes mellitus with other specified complication: Secondary | ICD-10-CM | POA: Diagnosis not present

## 2022-09-29 DIAGNOSIS — E1122 Type 2 diabetes mellitus with diabetic chronic kidney disease: Secondary | ICD-10-CM | POA: Diagnosis not present

## 2022-09-29 DIAGNOSIS — D631 Anemia in chronic kidney disease: Secondary | ICD-10-CM | POA: Diagnosis not present

## 2022-09-29 DIAGNOSIS — E1142 Type 2 diabetes mellitus with diabetic polyneuropathy: Secondary | ICD-10-CM | POA: Diagnosis not present

## 2022-09-29 DIAGNOSIS — N184 Chronic kidney disease, stage 4 (severe): Secondary | ICD-10-CM | POA: Diagnosis not present

## 2022-09-29 DIAGNOSIS — G250 Essential tremor: Secondary | ICD-10-CM | POA: Diagnosis not present

## 2022-09-29 DIAGNOSIS — N3281 Overactive bladder: Secondary | ICD-10-CM | POA: Diagnosis not present

## 2022-09-29 DIAGNOSIS — E7800 Pure hypercholesterolemia, unspecified: Secondary | ICD-10-CM | POA: Diagnosis not present

## 2022-09-29 DIAGNOSIS — I951 Orthostatic hypotension: Secondary | ICD-10-CM | POA: Diagnosis not present

## 2022-09-29 DIAGNOSIS — J45909 Unspecified asthma, uncomplicated: Secondary | ICD-10-CM | POA: Diagnosis not present

## 2022-09-29 DIAGNOSIS — I1310 Hypertensive heart and chronic kidney disease without heart failure, with stage 1 through stage 4 chronic kidney disease, or unspecified chronic kidney disease: Secondary | ICD-10-CM | POA: Diagnosis not present

## 2022-09-29 DIAGNOSIS — U071 COVID-19: Secondary | ICD-10-CM | POA: Diagnosis not present

## 2022-09-30 DIAGNOSIS — D631 Anemia in chronic kidney disease: Secondary | ICD-10-CM | POA: Diagnosis not present

## 2022-09-30 DIAGNOSIS — I951 Orthostatic hypotension: Secondary | ICD-10-CM | POA: Diagnosis not present

## 2022-09-30 DIAGNOSIS — E7800 Pure hypercholesterolemia, unspecified: Secondary | ICD-10-CM | POA: Diagnosis not present

## 2022-09-30 DIAGNOSIS — E1122 Type 2 diabetes mellitus with diabetic chronic kidney disease: Secondary | ICD-10-CM | POA: Diagnosis not present

## 2022-09-30 DIAGNOSIS — E1169 Type 2 diabetes mellitus with other specified complication: Secondary | ICD-10-CM | POA: Diagnosis not present

## 2022-09-30 DIAGNOSIS — G4700 Insomnia, unspecified: Secondary | ICD-10-CM | POA: Diagnosis not present

## 2022-09-30 DIAGNOSIS — K219 Gastro-esophageal reflux disease without esophagitis: Secondary | ICD-10-CM | POA: Diagnosis not present

## 2022-09-30 DIAGNOSIS — U071 COVID-19: Secondary | ICD-10-CM | POA: Diagnosis not present

## 2022-09-30 DIAGNOSIS — J45909 Unspecified asthma, uncomplicated: Secondary | ICD-10-CM | POA: Diagnosis not present

## 2022-09-30 DIAGNOSIS — G309 Alzheimer's disease, unspecified: Secondary | ICD-10-CM | POA: Diagnosis not present

## 2022-09-30 DIAGNOSIS — I1310 Hypertensive heart and chronic kidney disease without heart failure, with stage 1 through stage 4 chronic kidney disease, or unspecified chronic kidney disease: Secondary | ICD-10-CM | POA: Diagnosis not present

## 2022-09-30 DIAGNOSIS — G4762 Sleep related leg cramps: Secondary | ICD-10-CM | POA: Diagnosis not present

## 2022-09-30 DIAGNOSIS — G2581 Restless legs syndrome: Secondary | ICD-10-CM | POA: Diagnosis not present

## 2022-09-30 DIAGNOSIS — N184 Chronic kidney disease, stage 4 (severe): Secondary | ICD-10-CM | POA: Diagnosis not present

## 2022-09-30 DIAGNOSIS — M5011 Cervical disc disorder with radiculopathy,  high cervical region: Secondary | ICD-10-CM | POA: Diagnosis not present

## 2022-09-30 DIAGNOSIS — N3281 Overactive bladder: Secondary | ICD-10-CM | POA: Diagnosis not present

## 2022-09-30 DIAGNOSIS — N179 Acute kidney failure, unspecified: Secondary | ICD-10-CM | POA: Diagnosis not present

## 2022-09-30 DIAGNOSIS — M109 Gout, unspecified: Secondary | ICD-10-CM | POA: Diagnosis not present

## 2022-09-30 DIAGNOSIS — G250 Essential tremor: Secondary | ICD-10-CM | POA: Diagnosis not present

## 2022-09-30 DIAGNOSIS — M4722 Other spondylosis with radiculopathy, cervical region: Secondary | ICD-10-CM | POA: Diagnosis not present

## 2022-09-30 DIAGNOSIS — E1142 Type 2 diabetes mellitus with diabetic polyneuropathy: Secondary | ICD-10-CM | POA: Diagnosis not present

## 2022-10-01 DIAGNOSIS — N179 Acute kidney failure, unspecified: Secondary | ICD-10-CM | POA: Diagnosis not present

## 2022-10-01 DIAGNOSIS — N184 Chronic kidney disease, stage 4 (severe): Secondary | ICD-10-CM | POA: Diagnosis not present

## 2022-10-01 DIAGNOSIS — R531 Weakness: Secondary | ICD-10-CM | POA: Diagnosis not present

## 2022-10-01 DIAGNOSIS — N3281 Overactive bladder: Secondary | ICD-10-CM | POA: Diagnosis not present

## 2022-10-01 DIAGNOSIS — D649 Anemia, unspecified: Secondary | ICD-10-CM | POA: Diagnosis not present

## 2022-10-01 DIAGNOSIS — E7800 Pure hypercholesterolemia, unspecified: Secondary | ICD-10-CM | POA: Diagnosis not present

## 2022-10-01 DIAGNOSIS — Z5689 Other problems related to employment: Secondary | ICD-10-CM | POA: Diagnosis not present

## 2022-10-01 DIAGNOSIS — I1310 Hypertensive heart and chronic kidney disease without heart failure, with stage 1 through stage 4 chronic kidney disease, or unspecified chronic kidney disease: Secondary | ICD-10-CM | POA: Diagnosis not present

## 2022-10-01 DIAGNOSIS — G309 Alzheimer's disease, unspecified: Secondary | ICD-10-CM | POA: Diagnosis not present

## 2022-10-01 DIAGNOSIS — U071 COVID-19: Secondary | ICD-10-CM | POA: Diagnosis not present

## 2022-10-01 DIAGNOSIS — G2581 Restless legs syndrome: Secondary | ICD-10-CM | POA: Diagnosis not present

## 2022-10-01 DIAGNOSIS — D631 Anemia in chronic kidney disease: Secondary | ICD-10-CM | POA: Diagnosis not present

## 2022-10-01 DIAGNOSIS — D638 Anemia in other chronic diseases classified elsewhere: Secondary | ICD-10-CM | POA: Diagnosis not present

## 2022-10-01 DIAGNOSIS — I951 Orthostatic hypotension: Secondary | ICD-10-CM | POA: Diagnosis not present

## 2022-10-01 DIAGNOSIS — E785 Hyperlipidemia, unspecified: Secondary | ICD-10-CM | POA: Diagnosis not present

## 2022-10-01 DIAGNOSIS — G4762 Sleep related leg cramps: Secondary | ICD-10-CM | POA: Diagnosis not present

## 2022-10-01 DIAGNOSIS — J45909 Unspecified asthma, uncomplicated: Secondary | ICD-10-CM | POA: Diagnosis not present

## 2022-10-01 DIAGNOSIS — E860 Dehydration: Secondary | ICD-10-CM | POA: Diagnosis not present

## 2022-10-01 DIAGNOSIS — E1122 Type 2 diabetes mellitus with diabetic chronic kidney disease: Secondary | ICD-10-CM | POA: Diagnosis not present

## 2022-10-01 DIAGNOSIS — M5011 Cervical disc disorder with radiculopathy,  high cervical region: Secondary | ICD-10-CM | POA: Diagnosis not present

## 2022-10-01 DIAGNOSIS — E1169 Type 2 diabetes mellitus with other specified complication: Secondary | ICD-10-CM | POA: Diagnosis not present

## 2022-10-01 DIAGNOSIS — R7303 Prediabetes: Secondary | ICD-10-CM | POA: Diagnosis not present

## 2022-10-01 DIAGNOSIS — E1142 Type 2 diabetes mellitus with diabetic polyneuropathy: Secondary | ICD-10-CM | POA: Diagnosis not present

## 2022-10-01 DIAGNOSIS — G4700 Insomnia, unspecified: Secondary | ICD-10-CM | POA: Diagnosis not present

## 2022-10-01 DIAGNOSIS — M109 Gout, unspecified: Secondary | ICD-10-CM | POA: Diagnosis not present

## 2022-10-01 DIAGNOSIS — K219 Gastro-esophageal reflux disease without esophagitis: Secondary | ICD-10-CM | POA: Diagnosis not present

## 2022-10-01 DIAGNOSIS — N183 Chronic kidney disease, stage 3 unspecified: Secondary | ICD-10-CM | POA: Diagnosis not present

## 2022-10-01 DIAGNOSIS — M4722 Other spondylosis with radiculopathy, cervical region: Secondary | ICD-10-CM | POA: Diagnosis not present

## 2022-10-01 DIAGNOSIS — G250 Essential tremor: Secondary | ICD-10-CM | POA: Diagnosis not present

## 2022-10-02 DIAGNOSIS — U071 COVID-19: Secondary | ICD-10-CM | POA: Diagnosis not present

## 2022-10-02 DIAGNOSIS — E1142 Type 2 diabetes mellitus with diabetic polyneuropathy: Secondary | ICD-10-CM | POA: Diagnosis not present

## 2022-10-02 DIAGNOSIS — I1310 Hypertensive heart and chronic kidney disease without heart failure, with stage 1 through stage 4 chronic kidney disease, or unspecified chronic kidney disease: Secondary | ICD-10-CM | POA: Diagnosis not present

## 2022-10-02 DIAGNOSIS — N179 Acute kidney failure, unspecified: Secondary | ICD-10-CM | POA: Diagnosis not present

## 2022-10-02 DIAGNOSIS — M109 Gout, unspecified: Secondary | ICD-10-CM | POA: Diagnosis not present

## 2022-10-02 DIAGNOSIS — M4722 Other spondylosis with radiculopathy, cervical region: Secondary | ICD-10-CM | POA: Diagnosis not present

## 2022-10-02 DIAGNOSIS — K219 Gastro-esophageal reflux disease without esophagitis: Secondary | ICD-10-CM | POA: Diagnosis not present

## 2022-10-02 DIAGNOSIS — E1122 Type 2 diabetes mellitus with diabetic chronic kidney disease: Secondary | ICD-10-CM | POA: Diagnosis not present

## 2022-10-02 DIAGNOSIS — N3281 Overactive bladder: Secondary | ICD-10-CM | POA: Diagnosis not present

## 2022-10-02 DIAGNOSIS — G4700 Insomnia, unspecified: Secondary | ICD-10-CM | POA: Diagnosis not present

## 2022-10-02 DIAGNOSIS — G309 Alzheimer's disease, unspecified: Secondary | ICD-10-CM | POA: Diagnosis not present

## 2022-10-02 DIAGNOSIS — I951 Orthostatic hypotension: Secondary | ICD-10-CM | POA: Diagnosis not present

## 2022-10-02 DIAGNOSIS — N184 Chronic kidney disease, stage 4 (severe): Secondary | ICD-10-CM | POA: Diagnosis not present

## 2022-10-02 DIAGNOSIS — G250 Essential tremor: Secondary | ICD-10-CM | POA: Diagnosis not present

## 2022-10-02 DIAGNOSIS — M5011 Cervical disc disorder with radiculopathy,  high cervical region: Secondary | ICD-10-CM | POA: Diagnosis not present

## 2022-10-02 DIAGNOSIS — G2581 Restless legs syndrome: Secondary | ICD-10-CM | POA: Diagnosis not present

## 2022-10-02 DIAGNOSIS — D631 Anemia in chronic kidney disease: Secondary | ICD-10-CM | POA: Diagnosis not present

## 2022-10-02 DIAGNOSIS — J45909 Unspecified asthma, uncomplicated: Secondary | ICD-10-CM | POA: Diagnosis not present

## 2022-10-02 DIAGNOSIS — G4762 Sleep related leg cramps: Secondary | ICD-10-CM | POA: Diagnosis not present

## 2022-10-02 DIAGNOSIS — E7800 Pure hypercholesterolemia, unspecified: Secondary | ICD-10-CM | POA: Diagnosis not present

## 2022-10-02 DIAGNOSIS — E1169 Type 2 diabetes mellitus with other specified complication: Secondary | ICD-10-CM | POA: Diagnosis not present

## 2022-10-05 DIAGNOSIS — I1310 Hypertensive heart and chronic kidney disease without heart failure, with stage 1 through stage 4 chronic kidney disease, or unspecified chronic kidney disease: Secondary | ICD-10-CM | POA: Diagnosis not present

## 2022-10-05 DIAGNOSIS — U071 COVID-19: Secondary | ICD-10-CM | POA: Diagnosis not present

## 2022-10-05 DIAGNOSIS — D631 Anemia in chronic kidney disease: Secondary | ICD-10-CM | POA: Diagnosis not present

## 2022-10-05 DIAGNOSIS — J45909 Unspecified asthma, uncomplicated: Secondary | ICD-10-CM | POA: Diagnosis not present

## 2022-10-05 DIAGNOSIS — K219 Gastro-esophageal reflux disease without esophagitis: Secondary | ICD-10-CM | POA: Diagnosis not present

## 2022-10-05 DIAGNOSIS — N184 Chronic kidney disease, stage 4 (severe): Secondary | ICD-10-CM | POA: Diagnosis not present

## 2022-10-05 DIAGNOSIS — G4762 Sleep related leg cramps: Secondary | ICD-10-CM | POA: Diagnosis not present

## 2022-10-05 DIAGNOSIS — G309 Alzheimer's disease, unspecified: Secondary | ICD-10-CM | POA: Diagnosis not present

## 2022-10-05 DIAGNOSIS — M109 Gout, unspecified: Secondary | ICD-10-CM | POA: Diagnosis not present

## 2022-10-05 DIAGNOSIS — N179 Acute kidney failure, unspecified: Secondary | ICD-10-CM | POA: Diagnosis not present

## 2022-10-05 DIAGNOSIS — M5011 Cervical disc disorder with radiculopathy,  high cervical region: Secondary | ICD-10-CM | POA: Diagnosis not present

## 2022-10-05 DIAGNOSIS — E7800 Pure hypercholesterolemia, unspecified: Secondary | ICD-10-CM | POA: Diagnosis not present

## 2022-10-05 DIAGNOSIS — E1142 Type 2 diabetes mellitus with diabetic polyneuropathy: Secondary | ICD-10-CM | POA: Diagnosis not present

## 2022-10-05 DIAGNOSIS — E1169 Type 2 diabetes mellitus with other specified complication: Secondary | ICD-10-CM | POA: Diagnosis not present

## 2022-10-05 DIAGNOSIS — E1122 Type 2 diabetes mellitus with diabetic chronic kidney disease: Secondary | ICD-10-CM | POA: Diagnosis not present

## 2022-10-05 DIAGNOSIS — M4722 Other spondylosis with radiculopathy, cervical region: Secondary | ICD-10-CM | POA: Diagnosis not present

## 2022-10-05 DIAGNOSIS — G4700 Insomnia, unspecified: Secondary | ICD-10-CM | POA: Diagnosis not present

## 2022-10-05 DIAGNOSIS — I951 Orthostatic hypotension: Secondary | ICD-10-CM | POA: Diagnosis not present

## 2022-10-05 DIAGNOSIS — G2581 Restless legs syndrome: Secondary | ICD-10-CM | POA: Diagnosis not present

## 2022-10-05 DIAGNOSIS — N3281 Overactive bladder: Secondary | ICD-10-CM | POA: Diagnosis not present

## 2022-10-05 DIAGNOSIS — G250 Essential tremor: Secondary | ICD-10-CM | POA: Diagnosis not present

## 2022-10-07 DIAGNOSIS — N179 Acute kidney failure, unspecified: Secondary | ICD-10-CM | POA: Diagnosis not present

## 2022-10-07 DIAGNOSIS — K219 Gastro-esophageal reflux disease without esophagitis: Secondary | ICD-10-CM | POA: Diagnosis not present

## 2022-10-07 DIAGNOSIS — M4722 Other spondylosis with radiculopathy, cervical region: Secondary | ICD-10-CM | POA: Diagnosis not present

## 2022-10-07 DIAGNOSIS — N3281 Overactive bladder: Secondary | ICD-10-CM | POA: Diagnosis not present

## 2022-10-07 DIAGNOSIS — M5011 Cervical disc disorder with radiculopathy,  high cervical region: Secondary | ICD-10-CM | POA: Diagnosis not present

## 2022-10-07 DIAGNOSIS — I951 Orthostatic hypotension: Secondary | ICD-10-CM | POA: Diagnosis not present

## 2022-10-07 DIAGNOSIS — G250 Essential tremor: Secondary | ICD-10-CM | POA: Diagnosis not present

## 2022-10-07 DIAGNOSIS — E1142 Type 2 diabetes mellitus with diabetic polyneuropathy: Secondary | ICD-10-CM | POA: Diagnosis not present

## 2022-10-07 DIAGNOSIS — E1169 Type 2 diabetes mellitus with other specified complication: Secondary | ICD-10-CM | POA: Diagnosis not present

## 2022-10-07 DIAGNOSIS — M109 Gout, unspecified: Secondary | ICD-10-CM | POA: Diagnosis not present

## 2022-10-07 DIAGNOSIS — E1122 Type 2 diabetes mellitus with diabetic chronic kidney disease: Secondary | ICD-10-CM | POA: Diagnosis not present

## 2022-10-07 DIAGNOSIS — N184 Chronic kidney disease, stage 4 (severe): Secondary | ICD-10-CM | POA: Diagnosis not present

## 2022-10-07 DIAGNOSIS — I1310 Hypertensive heart and chronic kidney disease without heart failure, with stage 1 through stage 4 chronic kidney disease, or unspecified chronic kidney disease: Secondary | ICD-10-CM | POA: Diagnosis not present

## 2022-10-07 DIAGNOSIS — J45909 Unspecified asthma, uncomplicated: Secondary | ICD-10-CM | POA: Diagnosis not present

## 2022-10-07 DIAGNOSIS — U071 COVID-19: Secondary | ICD-10-CM | POA: Diagnosis not present

## 2022-10-07 DIAGNOSIS — D631 Anemia in chronic kidney disease: Secondary | ICD-10-CM | POA: Diagnosis not present

## 2022-10-07 DIAGNOSIS — E7800 Pure hypercholesterolemia, unspecified: Secondary | ICD-10-CM | POA: Diagnosis not present

## 2022-10-07 DIAGNOSIS — G2581 Restless legs syndrome: Secondary | ICD-10-CM | POA: Diagnosis not present

## 2022-10-07 DIAGNOSIS — G309 Alzheimer's disease, unspecified: Secondary | ICD-10-CM | POA: Diagnosis not present

## 2022-10-07 DIAGNOSIS — G4700 Insomnia, unspecified: Secondary | ICD-10-CM | POA: Diagnosis not present

## 2022-10-07 DIAGNOSIS — G4762 Sleep related leg cramps: Secondary | ICD-10-CM | POA: Diagnosis not present

## 2022-10-08 DIAGNOSIS — E1142 Type 2 diabetes mellitus with diabetic polyneuropathy: Secondary | ICD-10-CM | POA: Diagnosis not present

## 2022-10-08 DIAGNOSIS — E1169 Type 2 diabetes mellitus with other specified complication: Secondary | ICD-10-CM | POA: Diagnosis not present

## 2022-10-08 DIAGNOSIS — M5011 Cervical disc disorder with radiculopathy,  high cervical region: Secondary | ICD-10-CM | POA: Diagnosis not present

## 2022-10-08 DIAGNOSIS — E7800 Pure hypercholesterolemia, unspecified: Secondary | ICD-10-CM | POA: Diagnosis not present

## 2022-10-08 DIAGNOSIS — G309 Alzheimer's disease, unspecified: Secondary | ICD-10-CM | POA: Diagnosis not present

## 2022-10-08 DIAGNOSIS — G4700 Insomnia, unspecified: Secondary | ICD-10-CM | POA: Diagnosis not present

## 2022-10-08 DIAGNOSIS — M4722 Other spondylosis with radiculopathy, cervical region: Secondary | ICD-10-CM | POA: Diagnosis not present

## 2022-10-08 DIAGNOSIS — E1122 Type 2 diabetes mellitus with diabetic chronic kidney disease: Secondary | ICD-10-CM | POA: Diagnosis not present

## 2022-10-08 DIAGNOSIS — M109 Gout, unspecified: Secondary | ICD-10-CM | POA: Diagnosis not present

## 2022-10-08 DIAGNOSIS — N179 Acute kidney failure, unspecified: Secondary | ICD-10-CM | POA: Diagnosis not present

## 2022-10-08 DIAGNOSIS — N3281 Overactive bladder: Secondary | ICD-10-CM | POA: Diagnosis not present

## 2022-10-08 DIAGNOSIS — K219 Gastro-esophageal reflux disease without esophagitis: Secondary | ICD-10-CM | POA: Diagnosis not present

## 2022-10-08 DIAGNOSIS — I1310 Hypertensive heart and chronic kidney disease without heart failure, with stage 1 through stage 4 chronic kidney disease, or unspecified chronic kidney disease: Secondary | ICD-10-CM | POA: Diagnosis not present

## 2022-10-08 DIAGNOSIS — G4762 Sleep related leg cramps: Secondary | ICD-10-CM | POA: Diagnosis not present

## 2022-10-08 DIAGNOSIS — G2581 Restless legs syndrome: Secondary | ICD-10-CM | POA: Diagnosis not present

## 2022-10-08 DIAGNOSIS — I951 Orthostatic hypotension: Secondary | ICD-10-CM | POA: Diagnosis not present

## 2022-10-08 DIAGNOSIS — D631 Anemia in chronic kidney disease: Secondary | ICD-10-CM | POA: Diagnosis not present

## 2022-10-08 DIAGNOSIS — N184 Chronic kidney disease, stage 4 (severe): Secondary | ICD-10-CM | POA: Diagnosis not present

## 2022-10-08 DIAGNOSIS — J45909 Unspecified asthma, uncomplicated: Secondary | ICD-10-CM | POA: Diagnosis not present

## 2022-10-08 DIAGNOSIS — G250 Essential tremor: Secondary | ICD-10-CM | POA: Diagnosis not present

## 2022-10-08 DIAGNOSIS — U071 COVID-19: Secondary | ICD-10-CM | POA: Diagnosis not present

## 2022-10-13 DIAGNOSIS — Z79899 Other long term (current) drug therapy: Secondary | ICD-10-CM | POA: Diagnosis not present

## 2022-10-13 DIAGNOSIS — N3281 Overactive bladder: Secondary | ICD-10-CM | POA: Diagnosis not present

## 2022-10-13 DIAGNOSIS — R0789 Other chest pain: Secondary | ICD-10-CM | POA: Diagnosis not present

## 2022-10-13 DIAGNOSIS — G4762 Sleep related leg cramps: Secondary | ICD-10-CM | POA: Diagnosis not present

## 2022-10-13 DIAGNOSIS — N184 Chronic kidney disease, stage 4 (severe): Secondary | ICD-10-CM | POA: Diagnosis not present

## 2022-10-13 DIAGNOSIS — M4722 Other spondylosis with radiculopathy, cervical region: Secondary | ICD-10-CM | POA: Diagnosis not present

## 2022-10-13 DIAGNOSIS — G309 Alzheimer's disease, unspecified: Secondary | ICD-10-CM | POA: Diagnosis not present

## 2022-10-13 DIAGNOSIS — J45909 Unspecified asthma, uncomplicated: Secondary | ICD-10-CM | POA: Diagnosis not present

## 2022-10-13 DIAGNOSIS — E785 Hyperlipidemia, unspecified: Secondary | ICD-10-CM | POA: Diagnosis not present

## 2022-10-13 DIAGNOSIS — E1169 Type 2 diabetes mellitus with other specified complication: Secondary | ICD-10-CM | POA: Diagnosis not present

## 2022-10-13 DIAGNOSIS — E876 Hypokalemia: Secondary | ICD-10-CM | POA: Diagnosis not present

## 2022-10-13 DIAGNOSIS — G250 Essential tremor: Secondary | ICD-10-CM | POA: Diagnosis not present

## 2022-10-13 DIAGNOSIS — D631 Anemia in chronic kidney disease: Secondary | ICD-10-CM | POA: Diagnosis not present

## 2022-10-13 DIAGNOSIS — I1310 Hypertensive heart and chronic kidney disease without heart failure, with stage 1 through stage 4 chronic kidney disease, or unspecified chronic kidney disease: Secondary | ICD-10-CM | POA: Diagnosis not present

## 2022-10-13 DIAGNOSIS — U071 COVID-19: Secondary | ICD-10-CM | POA: Diagnosis not present

## 2022-10-13 DIAGNOSIS — N183 Chronic kidney disease, stage 3 unspecified: Secondary | ICD-10-CM | POA: Diagnosis not present

## 2022-10-13 DIAGNOSIS — E7800 Pure hypercholesterolemia, unspecified: Secondary | ICD-10-CM | POA: Diagnosis not present

## 2022-10-13 DIAGNOSIS — G4700 Insomnia, unspecified: Secondary | ICD-10-CM | POA: Diagnosis not present

## 2022-10-13 DIAGNOSIS — M5011 Cervical disc disorder with radiculopathy,  high cervical region: Secondary | ICD-10-CM | POA: Diagnosis not present

## 2022-10-13 DIAGNOSIS — M109 Gout, unspecified: Secondary | ICD-10-CM | POA: Diagnosis not present

## 2022-10-13 DIAGNOSIS — E1122 Type 2 diabetes mellitus with diabetic chronic kidney disease: Secondary | ICD-10-CM | POA: Diagnosis not present

## 2022-10-13 DIAGNOSIS — R531 Weakness: Secondary | ICD-10-CM | POA: Diagnosis not present

## 2022-10-13 DIAGNOSIS — I10 Essential (primary) hypertension: Secondary | ICD-10-CM | POA: Diagnosis not present

## 2022-10-13 DIAGNOSIS — I951 Orthostatic hypotension: Secondary | ICD-10-CM | POA: Diagnosis not present

## 2022-10-13 DIAGNOSIS — K219 Gastro-esophageal reflux disease without esophagitis: Secondary | ICD-10-CM | POA: Diagnosis not present

## 2022-10-13 DIAGNOSIS — E1142 Type 2 diabetes mellitus with diabetic polyneuropathy: Secondary | ICD-10-CM | POA: Diagnosis not present

## 2022-10-13 DIAGNOSIS — G2581 Restless legs syndrome: Secondary | ICD-10-CM | POA: Diagnosis not present

## 2022-10-13 DIAGNOSIS — R079 Chest pain, unspecified: Secondary | ICD-10-CM | POA: Diagnosis not present

## 2022-10-13 DIAGNOSIS — N179 Acute kidney failure, unspecified: Secondary | ICD-10-CM | POA: Diagnosis not present

## 2022-10-14 DIAGNOSIS — G2581 Restless legs syndrome: Secondary | ICD-10-CM | POA: Diagnosis not present

## 2022-10-14 DIAGNOSIS — E1122 Type 2 diabetes mellitus with diabetic chronic kidney disease: Secondary | ICD-10-CM | POA: Diagnosis not present

## 2022-10-14 DIAGNOSIS — I1310 Hypertensive heart and chronic kidney disease without heart failure, with stage 1 through stage 4 chronic kidney disease, or unspecified chronic kidney disease: Secondary | ICD-10-CM | POA: Diagnosis not present

## 2022-10-14 DIAGNOSIS — M5011 Cervical disc disorder with radiculopathy,  high cervical region: Secondary | ICD-10-CM | POA: Diagnosis not present

## 2022-10-14 DIAGNOSIS — E7800 Pure hypercholesterolemia, unspecified: Secondary | ICD-10-CM | POA: Diagnosis not present

## 2022-10-14 DIAGNOSIS — I951 Orthostatic hypotension: Secondary | ICD-10-CM | POA: Diagnosis not present

## 2022-10-14 DIAGNOSIS — N184 Chronic kidney disease, stage 4 (severe): Secondary | ICD-10-CM | POA: Diagnosis not present

## 2022-10-14 DIAGNOSIS — G250 Essential tremor: Secondary | ICD-10-CM | POA: Diagnosis not present

## 2022-10-14 DIAGNOSIS — E1142 Type 2 diabetes mellitus with diabetic polyneuropathy: Secondary | ICD-10-CM | POA: Diagnosis not present

## 2022-10-14 DIAGNOSIS — G309 Alzheimer's disease, unspecified: Secondary | ICD-10-CM | POA: Diagnosis not present

## 2022-10-14 DIAGNOSIS — J45909 Unspecified asthma, uncomplicated: Secondary | ICD-10-CM | POA: Diagnosis not present

## 2022-10-14 DIAGNOSIS — M109 Gout, unspecified: Secondary | ICD-10-CM | POA: Diagnosis not present

## 2022-10-14 DIAGNOSIS — G4700 Insomnia, unspecified: Secondary | ICD-10-CM | POA: Diagnosis not present

## 2022-10-14 DIAGNOSIS — U071 COVID-19: Secondary | ICD-10-CM | POA: Diagnosis not present

## 2022-10-14 DIAGNOSIS — N179 Acute kidney failure, unspecified: Secondary | ICD-10-CM | POA: Diagnosis not present

## 2022-10-14 DIAGNOSIS — N3281 Overactive bladder: Secondary | ICD-10-CM | POA: Diagnosis not present

## 2022-10-14 DIAGNOSIS — D631 Anemia in chronic kidney disease: Secondary | ICD-10-CM | POA: Diagnosis not present

## 2022-10-14 DIAGNOSIS — M4722 Other spondylosis with radiculopathy, cervical region: Secondary | ICD-10-CM | POA: Diagnosis not present

## 2022-10-14 DIAGNOSIS — G4762 Sleep related leg cramps: Secondary | ICD-10-CM | POA: Diagnosis not present

## 2022-10-14 DIAGNOSIS — E1169 Type 2 diabetes mellitus with other specified complication: Secondary | ICD-10-CM | POA: Diagnosis not present

## 2022-10-14 DIAGNOSIS — K219 Gastro-esophageal reflux disease without esophagitis: Secondary | ICD-10-CM | POA: Diagnosis not present

## 2022-10-15 DIAGNOSIS — M109 Gout, unspecified: Secondary | ICD-10-CM | POA: Diagnosis not present

## 2022-10-15 DIAGNOSIS — E1122 Type 2 diabetes mellitus with diabetic chronic kidney disease: Secondary | ICD-10-CM | POA: Diagnosis not present

## 2022-10-15 DIAGNOSIS — M5011 Cervical disc disorder with radiculopathy,  high cervical region: Secondary | ICD-10-CM | POA: Diagnosis not present

## 2022-10-15 DIAGNOSIS — K219 Gastro-esophageal reflux disease without esophagitis: Secondary | ICD-10-CM | POA: Diagnosis not present

## 2022-10-15 DIAGNOSIS — I951 Orthostatic hypotension: Secondary | ICD-10-CM | POA: Diagnosis not present

## 2022-10-15 DIAGNOSIS — E1142 Type 2 diabetes mellitus with diabetic polyneuropathy: Secondary | ICD-10-CM | POA: Diagnosis not present

## 2022-10-15 DIAGNOSIS — G2581 Restless legs syndrome: Secondary | ICD-10-CM | POA: Diagnosis not present

## 2022-10-15 DIAGNOSIS — G4762 Sleep related leg cramps: Secondary | ICD-10-CM | POA: Diagnosis not present

## 2022-10-15 DIAGNOSIS — M4722 Other spondylosis with radiculopathy, cervical region: Secondary | ICD-10-CM | POA: Diagnosis not present

## 2022-10-15 DIAGNOSIS — U071 COVID-19: Secondary | ICD-10-CM | POA: Diagnosis not present

## 2022-10-15 DIAGNOSIS — N184 Chronic kidney disease, stage 4 (severe): Secondary | ICD-10-CM | POA: Diagnosis not present

## 2022-10-15 DIAGNOSIS — G4700 Insomnia, unspecified: Secondary | ICD-10-CM | POA: Diagnosis not present

## 2022-10-15 DIAGNOSIS — E1169 Type 2 diabetes mellitus with other specified complication: Secondary | ICD-10-CM | POA: Diagnosis not present

## 2022-10-15 DIAGNOSIS — G250 Essential tremor: Secondary | ICD-10-CM | POA: Diagnosis not present

## 2022-10-15 DIAGNOSIS — I1310 Hypertensive heart and chronic kidney disease without heart failure, with stage 1 through stage 4 chronic kidney disease, or unspecified chronic kidney disease: Secondary | ICD-10-CM | POA: Diagnosis not present

## 2022-10-15 DIAGNOSIS — N3281 Overactive bladder: Secondary | ICD-10-CM | POA: Diagnosis not present

## 2022-10-15 DIAGNOSIS — D631 Anemia in chronic kidney disease: Secondary | ICD-10-CM | POA: Diagnosis not present

## 2022-10-15 DIAGNOSIS — G309 Alzheimer's disease, unspecified: Secondary | ICD-10-CM | POA: Diagnosis not present

## 2022-10-15 DIAGNOSIS — N179 Acute kidney failure, unspecified: Secondary | ICD-10-CM | POA: Diagnosis not present

## 2022-10-15 DIAGNOSIS — E7800 Pure hypercholesterolemia, unspecified: Secondary | ICD-10-CM | POA: Diagnosis not present

## 2022-10-15 DIAGNOSIS — J45909 Unspecified asthma, uncomplicated: Secondary | ICD-10-CM | POA: Diagnosis not present

## 2022-10-20 DIAGNOSIS — J45909 Unspecified asthma, uncomplicated: Secondary | ICD-10-CM | POA: Diagnosis not present

## 2022-10-20 DIAGNOSIS — N184 Chronic kidney disease, stage 4 (severe): Secondary | ICD-10-CM | POA: Diagnosis not present

## 2022-10-20 DIAGNOSIS — M5011 Cervical disc disorder with radiculopathy,  high cervical region: Secondary | ICD-10-CM | POA: Diagnosis not present

## 2022-10-20 DIAGNOSIS — D631 Anemia in chronic kidney disease: Secondary | ICD-10-CM | POA: Diagnosis not present

## 2022-10-20 DIAGNOSIS — G4762 Sleep related leg cramps: Secondary | ICD-10-CM | POA: Diagnosis not present

## 2022-10-20 DIAGNOSIS — I951 Orthostatic hypotension: Secondary | ICD-10-CM | POA: Diagnosis not present

## 2022-10-20 DIAGNOSIS — U071 COVID-19: Secondary | ICD-10-CM | POA: Diagnosis not present

## 2022-10-20 DIAGNOSIS — G309 Alzheimer's disease, unspecified: Secondary | ICD-10-CM | POA: Diagnosis not present

## 2022-10-20 DIAGNOSIS — N179 Acute kidney failure, unspecified: Secondary | ICD-10-CM | POA: Diagnosis not present

## 2022-10-20 DIAGNOSIS — E7800 Pure hypercholesterolemia, unspecified: Secondary | ICD-10-CM | POA: Diagnosis not present

## 2022-10-20 DIAGNOSIS — N3281 Overactive bladder: Secondary | ICD-10-CM | POA: Diagnosis not present

## 2022-10-20 DIAGNOSIS — I1310 Hypertensive heart and chronic kidney disease without heart failure, with stage 1 through stage 4 chronic kidney disease, or unspecified chronic kidney disease: Secondary | ICD-10-CM | POA: Diagnosis not present

## 2022-10-20 DIAGNOSIS — G2581 Restless legs syndrome: Secondary | ICD-10-CM | POA: Diagnosis not present

## 2022-10-20 DIAGNOSIS — G4700 Insomnia, unspecified: Secondary | ICD-10-CM | POA: Diagnosis not present

## 2022-10-20 DIAGNOSIS — M109 Gout, unspecified: Secondary | ICD-10-CM | POA: Diagnosis not present

## 2022-10-20 DIAGNOSIS — E1142 Type 2 diabetes mellitus with diabetic polyneuropathy: Secondary | ICD-10-CM | POA: Diagnosis not present

## 2022-10-20 DIAGNOSIS — M4722 Other spondylosis with radiculopathy, cervical region: Secondary | ICD-10-CM | POA: Diagnosis not present

## 2022-10-20 DIAGNOSIS — G250 Essential tremor: Secondary | ICD-10-CM | POA: Diagnosis not present

## 2022-10-20 DIAGNOSIS — E1169 Type 2 diabetes mellitus with other specified complication: Secondary | ICD-10-CM | POA: Diagnosis not present

## 2022-10-20 DIAGNOSIS — K219 Gastro-esophageal reflux disease without esophagitis: Secondary | ICD-10-CM | POA: Diagnosis not present

## 2022-10-20 DIAGNOSIS — E1122 Type 2 diabetes mellitus with diabetic chronic kidney disease: Secondary | ICD-10-CM | POA: Diagnosis not present

## 2022-10-21 DIAGNOSIS — J301 Allergic rhinitis due to pollen: Secondary | ICD-10-CM | POA: Diagnosis not present

## 2022-10-21 DIAGNOSIS — G4733 Obstructive sleep apnea (adult) (pediatric): Secondary | ICD-10-CM | POA: Diagnosis not present

## 2022-10-21 DIAGNOSIS — J4540 Moderate persistent asthma, uncomplicated: Secondary | ICD-10-CM | POA: Diagnosis not present

## 2022-10-21 DIAGNOSIS — G2581 Restless legs syndrome: Secondary | ICD-10-CM | POA: Diagnosis not present

## 2022-11-06 DIAGNOSIS — D638 Anemia in other chronic diseases classified elsewhere: Secondary | ICD-10-CM | POA: Diagnosis not present

## 2022-11-06 DIAGNOSIS — E1169 Type 2 diabetes mellitus with other specified complication: Secondary | ICD-10-CM | POA: Diagnosis not present

## 2022-11-06 DIAGNOSIS — N183 Chronic kidney disease, stage 3 unspecified: Secondary | ICD-10-CM | POA: Diagnosis not present

## 2022-11-06 DIAGNOSIS — E785 Hyperlipidemia, unspecified: Secondary | ICD-10-CM | POA: Diagnosis not present

## 2022-11-06 DIAGNOSIS — M109 Gout, unspecified: Secondary | ICD-10-CM | POA: Diagnosis not present

## 2022-11-10 DIAGNOSIS — M47812 Spondylosis without myelopathy or radiculopathy, cervical region: Secondary | ICD-10-CM | POA: Diagnosis not present

## 2022-11-10 DIAGNOSIS — Z981 Arthrodesis status: Secondary | ICD-10-CM | POA: Diagnosis not present

## 2022-11-10 DIAGNOSIS — Z96652 Presence of left artificial knee joint: Secondary | ICD-10-CM | POA: Diagnosis not present

## 2022-11-10 DIAGNOSIS — M25562 Pain in left knee: Secondary | ICD-10-CM | POA: Diagnosis not present

## 2022-11-13 DIAGNOSIS — H04123 Dry eye syndrome of bilateral lacrimal glands: Secondary | ICD-10-CM | POA: Diagnosis not present

## 2022-12-02 DIAGNOSIS — G4733 Obstructive sleep apnea (adult) (pediatric): Secondary | ICD-10-CM | POA: Diagnosis not present

## 2022-12-09 DIAGNOSIS — E860 Dehydration: Secondary | ICD-10-CM | POA: Diagnosis not present

## 2022-12-09 DIAGNOSIS — Z886 Allergy status to analgesic agent status: Secondary | ICD-10-CM | POA: Diagnosis not present

## 2022-12-09 DIAGNOSIS — E876 Hypokalemia: Secondary | ICD-10-CM | POA: Diagnosis not present

## 2022-12-09 DIAGNOSIS — I959 Hypotension, unspecified: Secondary | ICD-10-CM | POA: Diagnosis not present

## 2022-12-09 DIAGNOSIS — G8929 Other chronic pain: Secondary | ICD-10-CM | POA: Diagnosis not present

## 2022-12-09 DIAGNOSIS — D631 Anemia in chronic kidney disease: Secondary | ICD-10-CM | POA: Diagnosis not present

## 2022-12-09 DIAGNOSIS — I129 Hypertensive chronic kidney disease with stage 1 through stage 4 chronic kidney disease, or unspecified chronic kidney disease: Secondary | ICD-10-CM | POA: Diagnosis not present

## 2022-12-09 DIAGNOSIS — R001 Bradycardia, unspecified: Secondary | ICD-10-CM | POA: Diagnosis not present

## 2022-12-09 DIAGNOSIS — I951 Orthostatic hypotension: Secondary | ICD-10-CM | POA: Diagnosis not present

## 2022-12-09 DIAGNOSIS — M109 Gout, unspecified: Secondary | ICD-10-CM | POA: Diagnosis not present

## 2022-12-09 DIAGNOSIS — R55 Syncope and collapse: Secondary | ICD-10-CM | POA: Diagnosis not present

## 2022-12-09 DIAGNOSIS — Z79899 Other long term (current) drug therapy: Secondary | ICD-10-CM | POA: Diagnosis not present

## 2022-12-09 DIAGNOSIS — E1140 Type 2 diabetes mellitus with diabetic neuropathy, unspecified: Secondary | ICD-10-CM | POA: Diagnosis not present

## 2022-12-09 DIAGNOSIS — R9431 Abnormal electrocardiogram [ECG] [EKG]: Secondary | ICD-10-CM | POA: Diagnosis not present

## 2022-12-09 DIAGNOSIS — K219 Gastro-esophageal reflux disease without esophagitis: Secondary | ICD-10-CM | POA: Diagnosis not present

## 2022-12-09 DIAGNOSIS — E1122 Type 2 diabetes mellitus with diabetic chronic kidney disease: Secondary | ICD-10-CM | POA: Diagnosis not present

## 2022-12-09 DIAGNOSIS — E7800 Pure hypercholesterolemia, unspecified: Secondary | ICD-10-CM | POA: Diagnosis not present

## 2022-12-09 DIAGNOSIS — R5381 Other malaise: Secondary | ICD-10-CM | POA: Diagnosis not present

## 2022-12-09 DIAGNOSIS — N179 Acute kidney failure, unspecified: Secondary | ICD-10-CM | POA: Diagnosis not present

## 2022-12-09 DIAGNOSIS — M1990 Unspecified osteoarthritis, unspecified site: Secondary | ICD-10-CM | POA: Diagnosis not present

## 2022-12-09 DIAGNOSIS — Z1152 Encounter for screening for COVID-19: Secondary | ICD-10-CM | POA: Diagnosis not present

## 2022-12-09 DIAGNOSIS — J45909 Unspecified asthma, uncomplicated: Secondary | ICD-10-CM | POA: Diagnosis not present

## 2022-12-09 DIAGNOSIS — N183 Chronic kidney disease, stage 3 unspecified: Secondary | ICD-10-CM | POA: Diagnosis not present

## 2022-12-10 DIAGNOSIS — R5381 Other malaise: Secondary | ICD-10-CM | POA: Diagnosis not present

## 2022-12-10 DIAGNOSIS — N179 Acute kidney failure, unspecified: Secondary | ICD-10-CM | POA: Diagnosis not present

## 2022-12-10 DIAGNOSIS — I959 Hypotension, unspecified: Secondary | ICD-10-CM | POA: Diagnosis not present

## 2022-12-10 DIAGNOSIS — R55 Syncope and collapse: Secondary | ICD-10-CM | POA: Diagnosis not present

## 2022-12-10 DIAGNOSIS — E860 Dehydration: Secondary | ICD-10-CM | POA: Diagnosis not present

## 2022-12-11 DIAGNOSIS — E860 Dehydration: Secondary | ICD-10-CM | POA: Diagnosis not present

## 2022-12-11 DIAGNOSIS — R5381 Other malaise: Secondary | ICD-10-CM | POA: Diagnosis not present

## 2022-12-11 DIAGNOSIS — N179 Acute kidney failure, unspecified: Secondary | ICD-10-CM | POA: Diagnosis not present

## 2022-12-11 DIAGNOSIS — I959 Hypotension, unspecified: Secondary | ICD-10-CM | POA: Diagnosis not present

## 2022-12-11 DIAGNOSIS — R55 Syncope and collapse: Secondary | ICD-10-CM | POA: Diagnosis not present

## 2022-12-12 DIAGNOSIS — E860 Dehydration: Secondary | ICD-10-CM | POA: Diagnosis not present

## 2022-12-12 DIAGNOSIS — I959 Hypotension, unspecified: Secondary | ICD-10-CM | POA: Diagnosis not present

## 2022-12-12 DIAGNOSIS — R55 Syncope and collapse: Secondary | ICD-10-CM | POA: Diagnosis not present

## 2022-12-12 DIAGNOSIS — N179 Acute kidney failure, unspecified: Secondary | ICD-10-CM | POA: Diagnosis not present

## 2022-12-12 DIAGNOSIS — R5381 Other malaise: Secondary | ICD-10-CM | POA: Diagnosis not present

## 2022-12-15 DIAGNOSIS — J45909 Unspecified asthma, uncomplicated: Secondary | ICD-10-CM | POA: Diagnosis not present

## 2022-12-15 DIAGNOSIS — E1122 Type 2 diabetes mellitus with diabetic chronic kidney disease: Secondary | ICD-10-CM | POA: Diagnosis not present

## 2022-12-15 DIAGNOSIS — M5011 Cervical disc disorder with radiculopathy,  high cervical region: Secondary | ICD-10-CM | POA: Diagnosis not present

## 2022-12-15 DIAGNOSIS — G309 Alzheimer's disease, unspecified: Secondary | ICD-10-CM | POA: Diagnosis not present

## 2022-12-15 DIAGNOSIS — E559 Vitamin D deficiency, unspecified: Secondary | ICD-10-CM | POA: Diagnosis not present

## 2022-12-15 DIAGNOSIS — N184 Chronic kidney disease, stage 4 (severe): Secondary | ICD-10-CM | POA: Diagnosis not present

## 2022-12-15 DIAGNOSIS — I951 Orthostatic hypotension: Secondary | ICD-10-CM | POA: Diagnosis not present

## 2022-12-15 DIAGNOSIS — M109 Gout, unspecified: Secondary | ICD-10-CM | POA: Diagnosis not present

## 2022-12-15 DIAGNOSIS — K219 Gastro-esophageal reflux disease without esophagitis: Secondary | ICD-10-CM | POA: Diagnosis not present

## 2022-12-15 DIAGNOSIS — E1142 Type 2 diabetes mellitus with diabetic polyneuropathy: Secondary | ICD-10-CM | POA: Diagnosis not present

## 2022-12-15 DIAGNOSIS — I1310 Hypertensive heart and chronic kidney disease without heart failure, with stage 1 through stage 4 chronic kidney disease, or unspecified chronic kidney disease: Secondary | ICD-10-CM | POA: Diagnosis not present

## 2022-12-15 DIAGNOSIS — M4722 Other spondylosis with radiculopathy, cervical region: Secondary | ICD-10-CM | POA: Diagnosis not present

## 2022-12-15 DIAGNOSIS — G8929 Other chronic pain: Secondary | ICD-10-CM | POA: Diagnosis not present

## 2022-12-15 DIAGNOSIS — G4762 Sleep related leg cramps: Secondary | ICD-10-CM | POA: Diagnosis not present

## 2022-12-15 DIAGNOSIS — N3281 Overactive bladder: Secondary | ICD-10-CM | POA: Diagnosis not present

## 2022-12-15 DIAGNOSIS — G2581 Restless legs syndrome: Secondary | ICD-10-CM | POA: Diagnosis not present

## 2022-12-15 DIAGNOSIS — N179 Acute kidney failure, unspecified: Secondary | ICD-10-CM | POA: Diagnosis not present

## 2022-12-15 DIAGNOSIS — E7800 Pure hypercholesterolemia, unspecified: Secondary | ICD-10-CM | POA: Diagnosis not present

## 2022-12-15 DIAGNOSIS — E1169 Type 2 diabetes mellitus with other specified complication: Secondary | ICD-10-CM | POA: Diagnosis not present

## 2022-12-15 DIAGNOSIS — G4700 Insomnia, unspecified: Secondary | ICD-10-CM | POA: Diagnosis not present

## 2022-12-15 DIAGNOSIS — G250 Essential tremor: Secondary | ICD-10-CM | POA: Diagnosis not present

## 2022-12-18 DIAGNOSIS — M47816 Spondylosis without myelopathy or radiculopathy, lumbar region: Secondary | ICD-10-CM | POA: Diagnosis not present

## 2022-12-18 DIAGNOSIS — R5381 Other malaise: Secondary | ICD-10-CM | POA: Diagnosis not present

## 2022-12-18 DIAGNOSIS — R55 Syncope and collapse: Secondary | ICD-10-CM | POA: Diagnosis not present

## 2022-12-18 DIAGNOSIS — M79605 Pain in left leg: Secondary | ICD-10-CM | POA: Diagnosis not present

## 2022-12-18 DIAGNOSIS — N179 Acute kidney failure, unspecified: Secondary | ICD-10-CM | POA: Diagnosis not present

## 2022-12-18 DIAGNOSIS — M25552 Pain in left hip: Secondary | ICD-10-CM | POA: Diagnosis not present

## 2022-12-18 DIAGNOSIS — E861 Hypovolemia: Secondary | ICD-10-CM | POA: Diagnosis not present

## 2022-12-18 DIAGNOSIS — M419 Scoliosis, unspecified: Secondary | ICD-10-CM | POA: Diagnosis not present

## 2022-12-18 DIAGNOSIS — E860 Dehydration: Secondary | ICD-10-CM | POA: Diagnosis not present

## 2022-12-18 DIAGNOSIS — I129 Hypertensive chronic kidney disease with stage 1 through stage 4 chronic kidney disease, or unspecified chronic kidney disease: Secondary | ICD-10-CM | POA: Diagnosis not present

## 2022-12-18 DIAGNOSIS — E876 Hypokalemia: Secondary | ICD-10-CM | POA: Diagnosis not present

## 2022-12-24 DIAGNOSIS — I951 Orthostatic hypotension: Secondary | ICD-10-CM | POA: Diagnosis not present

## 2022-12-24 DIAGNOSIS — E1169 Type 2 diabetes mellitus with other specified complication: Secondary | ICD-10-CM | POA: Diagnosis not present

## 2022-12-24 DIAGNOSIS — E7800 Pure hypercholesterolemia, unspecified: Secondary | ICD-10-CM | POA: Diagnosis not present

## 2022-12-24 DIAGNOSIS — G4700 Insomnia, unspecified: Secondary | ICD-10-CM | POA: Diagnosis not present

## 2022-12-24 DIAGNOSIS — N179 Acute kidney failure, unspecified: Secondary | ICD-10-CM | POA: Diagnosis not present

## 2022-12-24 DIAGNOSIS — M4722 Other spondylosis with radiculopathy, cervical region: Secondary | ICD-10-CM | POA: Diagnosis not present

## 2022-12-24 DIAGNOSIS — E559 Vitamin D deficiency, unspecified: Secondary | ICD-10-CM | POA: Diagnosis not present

## 2022-12-24 DIAGNOSIS — G4762 Sleep related leg cramps: Secondary | ICD-10-CM | POA: Diagnosis not present

## 2022-12-24 DIAGNOSIS — J45909 Unspecified asthma, uncomplicated: Secondary | ICD-10-CM | POA: Diagnosis not present

## 2022-12-24 DIAGNOSIS — N3281 Overactive bladder: Secondary | ICD-10-CM | POA: Diagnosis not present

## 2022-12-24 DIAGNOSIS — M109 Gout, unspecified: Secondary | ICD-10-CM | POA: Diagnosis not present

## 2022-12-24 DIAGNOSIS — E1122 Type 2 diabetes mellitus with diabetic chronic kidney disease: Secondary | ICD-10-CM | POA: Diagnosis not present

## 2022-12-24 DIAGNOSIS — G309 Alzheimer's disease, unspecified: Secondary | ICD-10-CM | POA: Diagnosis not present

## 2022-12-24 DIAGNOSIS — M5011 Cervical disc disorder with radiculopathy,  high cervical region: Secondary | ICD-10-CM | POA: Diagnosis not present

## 2022-12-24 DIAGNOSIS — I1310 Hypertensive heart and chronic kidney disease without heart failure, with stage 1 through stage 4 chronic kidney disease, or unspecified chronic kidney disease: Secondary | ICD-10-CM | POA: Diagnosis not present

## 2022-12-24 DIAGNOSIS — K219 Gastro-esophageal reflux disease without esophagitis: Secondary | ICD-10-CM | POA: Diagnosis not present

## 2022-12-24 DIAGNOSIS — E1142 Type 2 diabetes mellitus with diabetic polyneuropathy: Secondary | ICD-10-CM | POA: Diagnosis not present

## 2022-12-24 DIAGNOSIS — G2581 Restless legs syndrome: Secondary | ICD-10-CM | POA: Diagnosis not present

## 2022-12-24 DIAGNOSIS — N184 Chronic kidney disease, stage 4 (severe): Secondary | ICD-10-CM | POA: Diagnosis not present

## 2022-12-24 DIAGNOSIS — G8929 Other chronic pain: Secondary | ICD-10-CM | POA: Diagnosis not present

## 2022-12-24 DIAGNOSIS — G250 Essential tremor: Secondary | ICD-10-CM | POA: Diagnosis not present

## 2022-12-25 DIAGNOSIS — G250 Essential tremor: Secondary | ICD-10-CM | POA: Diagnosis not present

## 2022-12-25 DIAGNOSIS — E1142 Type 2 diabetes mellitus with diabetic polyneuropathy: Secondary | ICD-10-CM | POA: Diagnosis not present

## 2022-12-25 DIAGNOSIS — E7800 Pure hypercholesterolemia, unspecified: Secondary | ICD-10-CM | POA: Diagnosis not present

## 2022-12-25 DIAGNOSIS — K219 Gastro-esophageal reflux disease without esophagitis: Secondary | ICD-10-CM | POA: Diagnosis not present

## 2022-12-25 DIAGNOSIS — G2581 Restless legs syndrome: Secondary | ICD-10-CM | POA: Diagnosis not present

## 2022-12-25 DIAGNOSIS — M5011 Cervical disc disorder with radiculopathy,  high cervical region: Secondary | ICD-10-CM | POA: Diagnosis not present

## 2022-12-25 DIAGNOSIS — G4700 Insomnia, unspecified: Secondary | ICD-10-CM | POA: Diagnosis not present

## 2022-12-25 DIAGNOSIS — E1122 Type 2 diabetes mellitus with diabetic chronic kidney disease: Secondary | ICD-10-CM | POA: Diagnosis not present

## 2022-12-25 DIAGNOSIS — G309 Alzheimer's disease, unspecified: Secondary | ICD-10-CM | POA: Diagnosis not present

## 2022-12-25 DIAGNOSIS — J45909 Unspecified asthma, uncomplicated: Secondary | ICD-10-CM | POA: Diagnosis not present

## 2022-12-25 DIAGNOSIS — N184 Chronic kidney disease, stage 4 (severe): Secondary | ICD-10-CM | POA: Diagnosis not present

## 2022-12-25 DIAGNOSIS — I951 Orthostatic hypotension: Secondary | ICD-10-CM | POA: Diagnosis not present

## 2022-12-25 DIAGNOSIS — I1310 Hypertensive heart and chronic kidney disease without heart failure, with stage 1 through stage 4 chronic kidney disease, or unspecified chronic kidney disease: Secondary | ICD-10-CM | POA: Diagnosis not present

## 2022-12-25 DIAGNOSIS — M109 Gout, unspecified: Secondary | ICD-10-CM | POA: Diagnosis not present

## 2022-12-25 DIAGNOSIS — G8929 Other chronic pain: Secondary | ICD-10-CM | POA: Diagnosis not present

## 2022-12-25 DIAGNOSIS — E559 Vitamin D deficiency, unspecified: Secondary | ICD-10-CM | POA: Diagnosis not present

## 2022-12-25 DIAGNOSIS — M4722 Other spondylosis with radiculopathy, cervical region: Secondary | ICD-10-CM | POA: Diagnosis not present

## 2022-12-25 DIAGNOSIS — N179 Acute kidney failure, unspecified: Secondary | ICD-10-CM | POA: Diagnosis not present

## 2022-12-25 DIAGNOSIS — N3281 Overactive bladder: Secondary | ICD-10-CM | POA: Diagnosis not present

## 2022-12-25 DIAGNOSIS — G4762 Sleep related leg cramps: Secondary | ICD-10-CM | POA: Diagnosis not present

## 2022-12-25 DIAGNOSIS — E1169 Type 2 diabetes mellitus with other specified complication: Secondary | ICD-10-CM | POA: Diagnosis not present

## 2022-12-28 DIAGNOSIS — E1122 Type 2 diabetes mellitus with diabetic chronic kidney disease: Secondary | ICD-10-CM | POA: Diagnosis not present

## 2022-12-28 DIAGNOSIS — G2581 Restless legs syndrome: Secondary | ICD-10-CM | POA: Diagnosis not present

## 2022-12-28 DIAGNOSIS — E559 Vitamin D deficiency, unspecified: Secondary | ICD-10-CM | POA: Diagnosis not present

## 2022-12-28 DIAGNOSIS — N3281 Overactive bladder: Secondary | ICD-10-CM | POA: Diagnosis not present

## 2022-12-28 DIAGNOSIS — M109 Gout, unspecified: Secondary | ICD-10-CM | POA: Diagnosis not present

## 2022-12-28 DIAGNOSIS — G8929 Other chronic pain: Secondary | ICD-10-CM | POA: Diagnosis not present

## 2022-12-28 DIAGNOSIS — E1142 Type 2 diabetes mellitus with diabetic polyneuropathy: Secondary | ICD-10-CM | POA: Diagnosis not present

## 2022-12-28 DIAGNOSIS — N179 Acute kidney failure, unspecified: Secondary | ICD-10-CM | POA: Diagnosis not present

## 2022-12-28 DIAGNOSIS — G4762 Sleep related leg cramps: Secondary | ICD-10-CM | POA: Diagnosis not present

## 2022-12-28 DIAGNOSIS — G4700 Insomnia, unspecified: Secondary | ICD-10-CM | POA: Diagnosis not present

## 2022-12-28 DIAGNOSIS — E7800 Pure hypercholesterolemia, unspecified: Secondary | ICD-10-CM | POA: Diagnosis not present

## 2022-12-28 DIAGNOSIS — N184 Chronic kidney disease, stage 4 (severe): Secondary | ICD-10-CM | POA: Diagnosis not present

## 2022-12-28 DIAGNOSIS — I951 Orthostatic hypotension: Secondary | ICD-10-CM | POA: Diagnosis not present

## 2022-12-28 DIAGNOSIS — I1310 Hypertensive heart and chronic kidney disease without heart failure, with stage 1 through stage 4 chronic kidney disease, or unspecified chronic kidney disease: Secondary | ICD-10-CM | POA: Diagnosis not present

## 2022-12-28 DIAGNOSIS — M5011 Cervical disc disorder with radiculopathy,  high cervical region: Secondary | ICD-10-CM | POA: Diagnosis not present

## 2022-12-28 DIAGNOSIS — K219 Gastro-esophageal reflux disease without esophagitis: Secondary | ICD-10-CM | POA: Diagnosis not present

## 2022-12-28 DIAGNOSIS — E1169 Type 2 diabetes mellitus with other specified complication: Secondary | ICD-10-CM | POA: Diagnosis not present

## 2022-12-28 DIAGNOSIS — J45909 Unspecified asthma, uncomplicated: Secondary | ICD-10-CM | POA: Diagnosis not present

## 2022-12-28 DIAGNOSIS — M4722 Other spondylosis with radiculopathy, cervical region: Secondary | ICD-10-CM | POA: Diagnosis not present

## 2022-12-28 DIAGNOSIS — G250 Essential tremor: Secondary | ICD-10-CM | POA: Diagnosis not present

## 2022-12-28 DIAGNOSIS — G309 Alzheimer's disease, unspecified: Secondary | ICD-10-CM | POA: Diagnosis not present

## 2022-12-31 DIAGNOSIS — G8929 Other chronic pain: Secondary | ICD-10-CM | POA: Diagnosis not present

## 2022-12-31 DIAGNOSIS — N179 Acute kidney failure, unspecified: Secondary | ICD-10-CM | POA: Diagnosis not present

## 2022-12-31 DIAGNOSIS — E1142 Type 2 diabetes mellitus with diabetic polyneuropathy: Secondary | ICD-10-CM | POA: Diagnosis not present

## 2022-12-31 DIAGNOSIS — I951 Orthostatic hypotension: Secondary | ICD-10-CM | POA: Diagnosis not present

## 2022-12-31 DIAGNOSIS — J45909 Unspecified asthma, uncomplicated: Secondary | ICD-10-CM | POA: Diagnosis not present

## 2022-12-31 DIAGNOSIS — N184 Chronic kidney disease, stage 4 (severe): Secondary | ICD-10-CM | POA: Diagnosis not present

## 2022-12-31 DIAGNOSIS — E7800 Pure hypercholesterolemia, unspecified: Secondary | ICD-10-CM | POA: Diagnosis not present

## 2022-12-31 DIAGNOSIS — E559 Vitamin D deficiency, unspecified: Secondary | ICD-10-CM | POA: Diagnosis not present

## 2022-12-31 DIAGNOSIS — G4762 Sleep related leg cramps: Secondary | ICD-10-CM | POA: Diagnosis not present

## 2022-12-31 DIAGNOSIS — M4722 Other spondylosis with radiculopathy, cervical region: Secondary | ICD-10-CM | POA: Diagnosis not present

## 2022-12-31 DIAGNOSIS — J4540 Moderate persistent asthma, uncomplicated: Secondary | ICD-10-CM | POA: Diagnosis not present

## 2022-12-31 DIAGNOSIS — K219 Gastro-esophageal reflux disease without esophagitis: Secondary | ICD-10-CM | POA: Diagnosis not present

## 2022-12-31 DIAGNOSIS — M109 Gout, unspecified: Secondary | ICD-10-CM | POA: Diagnosis not present

## 2022-12-31 DIAGNOSIS — J301 Allergic rhinitis due to pollen: Secondary | ICD-10-CM | POA: Diagnosis not present

## 2022-12-31 DIAGNOSIS — E1122 Type 2 diabetes mellitus with diabetic chronic kidney disease: Secondary | ICD-10-CM | POA: Diagnosis not present

## 2022-12-31 DIAGNOSIS — M5011 Cervical disc disorder with radiculopathy,  high cervical region: Secondary | ICD-10-CM | POA: Diagnosis not present

## 2022-12-31 DIAGNOSIS — G4733 Obstructive sleep apnea (adult) (pediatric): Secondary | ICD-10-CM | POA: Diagnosis not present

## 2022-12-31 DIAGNOSIS — N3281 Overactive bladder: Secondary | ICD-10-CM | POA: Diagnosis not present

## 2022-12-31 DIAGNOSIS — G4700 Insomnia, unspecified: Secondary | ICD-10-CM | POA: Diagnosis not present

## 2022-12-31 DIAGNOSIS — I1310 Hypertensive heart and chronic kidney disease without heart failure, with stage 1 through stage 4 chronic kidney disease, or unspecified chronic kidney disease: Secondary | ICD-10-CM | POA: Diagnosis not present

## 2022-12-31 DIAGNOSIS — G309 Alzheimer's disease, unspecified: Secondary | ICD-10-CM | POA: Diagnosis not present

## 2022-12-31 DIAGNOSIS — G250 Essential tremor: Secondary | ICD-10-CM | POA: Diagnosis not present

## 2022-12-31 DIAGNOSIS — G2581 Restless legs syndrome: Secondary | ICD-10-CM | POA: Diagnosis not present

## 2022-12-31 DIAGNOSIS — E1169 Type 2 diabetes mellitus with other specified complication: Secondary | ICD-10-CM | POA: Diagnosis not present

## 2023-01-01 DIAGNOSIS — Z981 Arthrodesis status: Secondary | ICD-10-CM | POA: Diagnosis not present

## 2023-01-01 DIAGNOSIS — M47812 Spondylosis without myelopathy or radiculopathy, cervical region: Secondary | ICD-10-CM | POA: Diagnosis not present

## 2023-01-01 DIAGNOSIS — M25562 Pain in left knee: Secondary | ICD-10-CM | POA: Diagnosis not present

## 2023-01-01 DIAGNOSIS — Z96652 Presence of left artificial knee joint: Secondary | ICD-10-CM | POA: Diagnosis not present

## 2023-01-19 DIAGNOSIS — M25562 Pain in left knee: Secondary | ICD-10-CM | POA: Diagnosis not present

## 2023-01-19 DIAGNOSIS — M542 Cervicalgia: Secondary | ICD-10-CM | POA: Diagnosis not present

## 2023-01-19 DIAGNOSIS — M25662 Stiffness of left knee, not elsewhere classified: Secondary | ICD-10-CM | POA: Diagnosis not present

## 2023-01-19 DIAGNOSIS — M2560 Stiffness of unspecified joint, not elsewhere classified: Secondary | ICD-10-CM | POA: Diagnosis not present

## 2023-01-22 DIAGNOSIS — I129 Hypertensive chronic kidney disease with stage 1 through stage 4 chronic kidney disease, or unspecified chronic kidney disease: Secondary | ICD-10-CM | POA: Diagnosis not present

## 2023-01-22 DIAGNOSIS — M898X9 Other specified disorders of bone, unspecified site: Secondary | ICD-10-CM | POA: Diagnosis not present

## 2023-01-22 DIAGNOSIS — N183 Chronic kidney disease, stage 3 unspecified: Secondary | ICD-10-CM | POA: Diagnosis not present

## 2023-01-22 DIAGNOSIS — D631 Anemia in chronic kidney disease: Secondary | ICD-10-CM | POA: Diagnosis not present

## 2023-01-22 DIAGNOSIS — E559 Vitamin D deficiency, unspecified: Secondary | ICD-10-CM | POA: Diagnosis not present

## 2023-01-27 DIAGNOSIS — Z96652 Presence of left artificial knee joint: Secondary | ICD-10-CM | POA: Diagnosis not present

## 2023-01-27 DIAGNOSIS — M25562 Pain in left knee: Secondary | ICD-10-CM | POA: Diagnosis not present

## 2023-01-27 DIAGNOSIS — G4733 Obstructive sleep apnea (adult) (pediatric): Secondary | ICD-10-CM | POA: Diagnosis not present

## 2023-01-28 DIAGNOSIS — R109 Unspecified abdominal pain: Secondary | ICD-10-CM | POA: Diagnosis not present

## 2023-01-28 DIAGNOSIS — K219 Gastro-esophageal reflux disease without esophagitis: Secondary | ICD-10-CM | POA: Diagnosis not present

## 2023-01-28 DIAGNOSIS — R1310 Dysphagia, unspecified: Secondary | ICD-10-CM | POA: Diagnosis not present

## 2023-01-28 DIAGNOSIS — N183 Chronic kidney disease, stage 3 unspecified: Secondary | ICD-10-CM | POA: Diagnosis not present

## 2023-01-28 DIAGNOSIS — R634 Abnormal weight loss: Secondary | ICD-10-CM | POA: Diagnosis not present

## 2023-02-01 DIAGNOSIS — M2560 Stiffness of unspecified joint, not elsewhere classified: Secondary | ICD-10-CM | POA: Diagnosis not present

## 2023-02-01 DIAGNOSIS — M542 Cervicalgia: Secondary | ICD-10-CM | POA: Diagnosis not present

## 2023-02-01 DIAGNOSIS — M25562 Pain in left knee: Secondary | ICD-10-CM | POA: Diagnosis not present

## 2023-02-02 DIAGNOSIS — R634 Abnormal weight loss: Secondary | ICD-10-CM | POA: Diagnosis not present

## 2023-02-02 DIAGNOSIS — K222 Esophageal obstruction: Secondary | ICD-10-CM | POA: Diagnosis not present

## 2023-02-02 DIAGNOSIS — D131 Benign neoplasm of stomach: Secondary | ICD-10-CM | POA: Diagnosis not present

## 2023-02-02 DIAGNOSIS — K635 Polyp of colon: Secondary | ICD-10-CM | POA: Diagnosis not present

## 2023-02-02 DIAGNOSIS — R1310 Dysphagia, unspecified: Secondary | ICD-10-CM | POA: Diagnosis not present

## 2023-02-02 DIAGNOSIS — I10 Essential (primary) hypertension: Secondary | ICD-10-CM | POA: Diagnosis not present

## 2023-02-02 DIAGNOSIS — D124 Benign neoplasm of descending colon: Secondary | ICD-10-CM | POA: Diagnosis not present

## 2023-02-02 DIAGNOSIS — D123 Benign neoplasm of transverse colon: Secondary | ICD-10-CM | POA: Diagnosis not present

## 2023-02-02 DIAGNOSIS — D120 Benign neoplasm of cecum: Secondary | ICD-10-CM | POA: Diagnosis not present

## 2023-02-02 DIAGNOSIS — K317 Polyp of stomach and duodenum: Secondary | ICD-10-CM | POA: Diagnosis not present

## 2023-02-02 DIAGNOSIS — D126 Benign neoplasm of colon, unspecified: Secondary | ICD-10-CM | POA: Diagnosis not present

## 2023-02-02 DIAGNOSIS — Z1211 Encounter for screening for malignant neoplasm of colon: Secondary | ICD-10-CM | POA: Diagnosis not present

## 2023-02-02 DIAGNOSIS — K5730 Diverticulosis of large intestine without perforation or abscess without bleeding: Secondary | ICD-10-CM | POA: Diagnosis not present

## 2023-02-02 DIAGNOSIS — D649 Anemia, unspecified: Secondary | ICD-10-CM | POA: Diagnosis not present

## 2023-02-02 DIAGNOSIS — R519 Headache, unspecified: Secondary | ICD-10-CM | POA: Diagnosis not present

## 2023-02-02 DIAGNOSIS — D128 Benign neoplasm of rectum: Secondary | ICD-10-CM | POA: Diagnosis not present

## 2023-02-02 DIAGNOSIS — M542 Cervicalgia: Secondary | ICD-10-CM | POA: Diagnosis not present

## 2023-02-03 DIAGNOSIS — M25562 Pain in left knee: Secondary | ICD-10-CM | POA: Diagnosis not present

## 2023-02-03 DIAGNOSIS — M2560 Stiffness of unspecified joint, not elsewhere classified: Secondary | ICD-10-CM | POA: Diagnosis not present

## 2023-02-03 DIAGNOSIS — M542 Cervicalgia: Secondary | ICD-10-CM | POA: Diagnosis not present

## 2023-02-04 DIAGNOSIS — G2581 Restless legs syndrome: Secondary | ICD-10-CM | POA: Diagnosis not present

## 2023-02-04 DIAGNOSIS — M25562 Pain in left knee: Secondary | ICD-10-CM | POA: Diagnosis not present

## 2023-02-04 DIAGNOSIS — Z96652 Presence of left artificial knee joint: Secondary | ICD-10-CM | POA: Diagnosis not present

## 2023-02-04 DIAGNOSIS — J301 Allergic rhinitis due to pollen: Secondary | ICD-10-CM | POA: Diagnosis not present

## 2023-02-04 DIAGNOSIS — G4733 Obstructive sleep apnea (adult) (pediatric): Secondary | ICD-10-CM | POA: Diagnosis not present

## 2023-02-04 DIAGNOSIS — J4540 Moderate persistent asthma, uncomplicated: Secondary | ICD-10-CM | POA: Diagnosis not present

## 2023-02-05 NOTE — Progress Notes (Deleted)
Cardiology Office Note:  .   Date:  02/08/2023  ID:  SUZZETTE Wise, DOB Oct 27, 1950, MRN [id number] PCP: Paulina Fusi, MD  [facility] Providers Cardiologist:  Norman Herrlich, MD { Click to update primary MD,subspecialty MD or APP then REFRESH:1}   History of Present Illness: .   Sharon Wise is a 73 y.o. female with a past medical history of hypertension, Alzheimer'Sharon disease, CKD--follows with nephrology, dyslipidemia.  03/28/2022 echo EF 55 to 60%, impaired relaxation, trivial MR, mild to moderate pulmonic valve regurgitation, moderate TR, PASP mildly elevated at 43 mmHg 06/03/2021 Lexiscan no diagnostic changes 06/14/2020 coronary CTA calcium score of 0.4, 43rd percentile, nonobstructive CAD  Most recently evaluated by Dr. Dulce Sellar on 08/12/2022, was advised she could follow-up as needed.  ROS: ROS   Studies Reviewed: .        Cardiac Studies & Procedures      ECHOCARDIOGRAM  ECHOCARDIOGRAM COMPLETE 05/18/2018  Narrative ECHOCARDIOGRAM REPORT    Patient Name:   Sharon Wise Date of Exam: 05/18/2018 Medical Rec #:  [id number]         Height:       66.0 in Accession #:    [id number]        Weight:       150.0 lb Date of Birth:  [birth year]         BSA:          1.77 m Patient Age:    67 years          BP:           154/94 mmHg Patient Gender: F                 HR:           59 bpm. Exam Location:  [facility]   Procedure: 2D Echo  Indications:    Chest pain syndrome [R07.9 (ICD-10-CM)]  History:        Patient has no prior history of Echocardiogram examinations. Signs/Symptoms: Shortness of Breath Risk Factors: Hypertension and Dyslipidemia.  Sonographer:    Louie Boston Referring Phys: Norman Herrlich, J  IMPRESSIONS   1. The left ventricle has normal systolic function with an ejection fraction of 60-65%. The cavity size was normal. Left ventricular diastolic Doppler parameters are consistent with impaired relaxation. 2. The right ventricle has normal  systolic function. The cavity was normal. There is no increase in right ventricular wall thickness. 3. Tricuspid valve regurgitation is mild-moderate.  FINDINGS Left Ventricle: The left ventricle has normal systolic function, with an ejection fraction of 60-65%. The cavity size was normal. There is no increase in left ventricular wall thickness. Left ventricular diastolic Doppler parameters are consistent with impaired relaxation.  Right Ventricle: The right ventricle has normal systolic function. The cavity was normal. There is no increase in right ventricular wall thickness.  Left Atrium: Left atrial size was normal in size.  Right Atrium: Right atrial size was normal in size. Right atrial pressure is estimated at 10 mmHg.  Interatrial Septum: No atrial level shunt detected by color flow Doppler.  Pericardium: There is no evidence of pericardial effusion.  Mitral Valve: The mitral valve is normal in structure. Mitral valve regurgitation is trivial by color flow Doppler.  Tricuspid Valve: The tricuspid valve is normal in structure. Tricuspid valve regurgitation is mild-moderate by color flow Doppler.  Aortic Valve: The aortic valve is normal in structure. Aortic valve regurgitation was not assessed by color flow Doppler.  Pulmonic Valve: The pulmonic valve was grossly normal. Pulmonic valve regurgitation was not assessed by color flow Doppler.  Venous: The inferior vena cava measures 1.10 cm, is normal in size with greater than 50% respiratory variability.   +--------------+--------++ LEFT VENTRICLE         +----------------+---------++ +--------------+--------++ Diastology                PLAX 2D                +----------------+---------++ +--------------+--------++ LV e' lateral:  6.96 cm/Sharon LVIDd:        4.70 cm  +----------------+---------++ +--------------+--------++ LV E/e' lateral:7.2       LVIDs:        3.10 cm   +----------------+---------++ +--------------+--------++ LV e' medial:   6.42 cm/Sharon LV PW:        1.30 cm  +----------------+---------++ +--------------+--------++ LV E/e' medial: 7.8       LV IVS:       1.10 cm  +----------------+---------++ +--------------+--------++ LVOT diam:    2.00 cm  +--------------+--------++ LV SV:        64 ml    +--------------+--------++ LV SV Index:  36.04    +--------------+--------++ LVOT Area:    3.14 cm +--------------+--------++                        +--------------+--------++  +---------------+----------++ RIGHT VENTRICLE           +---------------+----------++ RV Sharon prime:    14.70 cm/Sharon +---------------+----------++ TAPSE (M-mode):2.1 cm     +---------------+----------++ RVSP:          31.3 mmHg  +---------------+----------++  +---------------+-------++-----------++ LEFT ATRIUM           Index       +---------------+-------++-----------++ LA diam:       3.60 cm2.03 cm/m  +---------------+-------++-----------++ LA Vol (A2C):  41.8 ml23.62 ml/m +---------------+-------++-----------++ LA Vol (A4C):  29.5 ml16.67 ml/m +---------------+-------++-----------++ LA Biplane Vol:36.2 ml20.45 ml/m +---------------+-------++-----------++ +------------+---------++-----------++ RIGHT ATRIUM         Index       +------------+---------++-----------++ RA Pressure:3.00 mmHg            +------------+---------++-----------++ RA Area:    14.30 cm            +------------+---------++-----------++ RA Volume:  31.80 ml 17.97 ml/m +------------+---------++-----------++ +------------+-----------++ AORTIC VALVE            +------------+-----------++ LVOT Vmax:  68.70 cm/Sharon  +------------+-----------++ LVOT Vmean: 45.900 cm/Sharon +------------+-----------++ LVOT VTI:   0.172 m      +------------+-----------++  +-------------+-------++ AORTA                +-------------+-------++ Ao Root diam:3.50 cm +-------------+-------++ Ao Asc diam: 2.90 cm +-------------+-------++  +--------------+----------++ +---------------+-----------++ MITRAL VALVE             TRICUSPID VALVE            +--------------+----------++ +---------------+-----------++ MV Area (PHT):2.80 cm   TR Peak grad:  28.3 mmHg   +--------------+----------++ +---------------+-----------++ MV PHT:       78.59 msec TR Vmax:       293.00 cm/Sharon +--------------+----------++ +---------------+-----------++ MV Decel Time:271 msec   Estimated RAP: 3.00 mmHg   +--------------+----------++ +---------------+-----------++ +--------------+----------++ RVSP:          31.3 mmHg   MV E velocity:50.00 cm/Sharon +---------------+-----------++ +--------------+----------++ MV A velocity:73.20 cm/Sharon +--------------+-------+ +--------------+----------++ SHUNTS                MV E/A  ratio: 0.68       +--------------+-------+ +--------------+----------++ Systemic VTI: 0.17 m  +--------------+-------+ Systemic Diam:2.00 cm +--------------+-------+  +---------+-------+ IVC              +---------+-------+ IVC diam:1.10 cm +---------+-------+   Gypsy Balsam MD Electronically signed by Gypsy Balsam MD Signature Date/Time: 05/18/2018/12:50:06 PM    Final    CT SCANS  CT CORONARY MORPH W/CTA COR W/SCORE 07/25/2018  Addendum 07/25/2018  1:25 PM ADDENDUM REPORT: 07/25/2018 13:23  CLINICAL DATA:  Chest pain  EXAM: Cardiac CTA  MEDICATIONS: Sub lingual nitro. 4mg  x 2  TECHNIQUE: The patient was scanned on a Siemens 192 slice scanner. Gantry rotation speed was 250 msecs. Collimation was 0.6 mm. A 100 kV prospective scan was triggered in the ascending thoracic aorta at 35-75% of the R-R interval. Average HR during the scan was 60  bpm. The 3D data set was interpreted on a dedicated work station using MPR, MIP and VRT modes. A total of 80cc of contrast was used.  FINDINGS: Non-cardiac: See separate report from [facility].  Pulmonary veins drain normally to the left atrium.  Calcium Score: 0.4 Agatston units.  Coronary Arteries: Right dominant with no anomalies  LM: No plaque or stenosis.  LAD system:  No plaque or stenosis.  Circumflex system: No plaque or stenosis.  RCA system: There is misregistration artifact but it does not affect the ability to read the study. Mixed plaque proximal RCA with minimal stenosis  IMPRESSION: 1. Coronary artery calcium score 0.4 Agatston units places the patient in the 43rd percentile for age and gender, suggesting intermediate risk for future cardiac events.  2.  No obstructive CAD.  Dalton Mclean   Electronically Signed By: Marca Ancona M.D. On: 07/25/2018 13:23  Narrative EXAM: OVER-READ INTERPRETATION  CT CHEST  The following report is an over-read performed by radiologist Dr. Jeronimo Greaves of [facility] on 07/25/2018. This over-read does not include interpretation of cardiac or coronary anatomy or pathology. The coronary CTA interpretation by the cardiologist is attached.  COMPARISON:  06/16/2018 CTA of the chest.  FINDINGS: Vascular: Normal aortic caliber, without dissection. No central pulmonary embolism, on this non-dedicated study.  Mediastinum/Nodes: No imaged thoracic adenopathy.  Lungs/Pleura: No imaged pleural fluid. A 2 mm right lower lobe pulmonary nodule was present on 07/29/2014 and can be presumed benign.  Left lung base scarring or atelectasis.  Upper Abdomen: Normal imaged portions of the liver, spleen, stomach.  Musculoskeletal: Midthoracic spondylosis.  IMPRESSION: No acute extracardiac findings in the imaged chest.  Electronically Signed: By: Jeronimo Greaves M.D. On: 07/25/2018 08:46          Risk  Assessment/Calculations:   {Does this patient have ATRIAL FIBRILLATION?:[phone]} No BP recorded.  {Refresh Note OR Click here to enter BP  :1}***       Physical Exam:   VS:  LMP  (LMP Unknown)    Wt Readings from Last 3 Encounters:  08/12/22 151 lb 3.2 oz (68.6 kg)  06/03/20 166 lb 12.8 oz (75.7 kg)  06/14/19 169 lb 3.2 oz (76.7 kg)    GEN: Well nourished, well developed in no acute distress NECK: No JVD; No carotid bruits CARDIAC: ***RRR, no murmurs, rubs, gallops RESPIRATORY:  Clear to auscultation without rales, wheezing or rhonchi  ABDOMEN: Soft, non-tender, non-distended EXTREMITIES:  No edema; No deformity   ASSESSMENT AND PLAN: .   CAD -      {Are you ordering a CV Procedure (e.g. stress test, cath,  DCCV, TEE, etc)?   Press F2        :[id number]}  Dispo: ***  Signed, Flossie Dibble, NP

## 2023-02-08 ENCOUNTER — Ambulatory Visit: Payer: 59 | Attending: Cardiology | Admitting: Cardiology

## 2023-02-08 ENCOUNTER — Ambulatory Visit: Payer: 59 | Attending: Cardiology

## 2023-02-08 ENCOUNTER — Encounter: Payer: Self-pay | Admitting: Cardiology

## 2023-02-08 ENCOUNTER — Ambulatory Visit: Payer: 59 | Admitting: Cardiology

## 2023-02-08 VITALS — BP 110/80 | HR 52 | Ht 62.0 in | Wt 133.0 lb

## 2023-02-08 DIAGNOSIS — I2510 Atherosclerotic heart disease of native coronary artery without angina pectoris: Secondary | ICD-10-CM | POA: Diagnosis not present

## 2023-02-08 DIAGNOSIS — R001 Bradycardia, unspecified: Secondary | ICD-10-CM | POA: Diagnosis not present

## 2023-02-08 DIAGNOSIS — R002 Palpitations: Secondary | ICD-10-CM | POA: Diagnosis not present

## 2023-02-08 DIAGNOSIS — I10 Essential (primary) hypertension: Secondary | ICD-10-CM

## 2023-02-08 DIAGNOSIS — E782 Mixed hyperlipidemia: Secondary | ICD-10-CM

## 2023-02-08 DIAGNOSIS — N182 Chronic kidney disease, stage 2 (mild): Secondary | ICD-10-CM | POA: Diagnosis not present

## 2023-02-08 DIAGNOSIS — D631 Anemia in chronic kidney disease: Secondary | ICD-10-CM | POA: Diagnosis not present

## 2023-02-08 NOTE — Progress Notes (Signed)
Cardiology Office Note:  .   Date:  02/08/2023  ID:  Sharon Wise, DOB 1950-06-28, MRN [id number] PCP: Paulina Fusi, MD  [facility] Providers Cardiologist:  Norman Herrlich, MD    History of Present Illness: .   Sharon Wise is a 73 y.o. female with a past medical history of hypertension, Alzheimer's disease, CKD--follows with nephrology, dyslipidemia, mild nonobstructive CAD per coronary CTA.  03/28/2022 echo EF 55 to 60%, impaired relaxation, trivial MR, mild to moderate pulmonic valve regurgitation, moderate TR, PASP mildly elevated at 43 mmHg 06/03/2021 Lexiscan no diagnostic changes 06/14/2020 coronary CTA calcium score of 0.4, 43rd percentile, nonobstructive CAD  Most recently evaluated by Dr. Dulce Sellar on 08/12/2022, was advised she could follow-up as needed.  She presents today for follow-up after recently undergoing an upper endoscopy and noted that she was having some arrhythmias, and advised to follow back up. She offers no formal complaints. She stays relatively active at home, cares for her younger brother. Her EKG today reveals sinus bradycardia, but she is not symptomatic. She denies chest pain, palpitations, dyspnea, pnd, orthopnea, n, v, dizziness, syncope, edema, weight gain, or early satiety.   ROS: Review of Systems  All other systems reviewed and are negative.    Studies Reviewed: Marland Kitchen   EKG Interpretation Date/Time:  Monday February 08 2023 10:21:03 EST Ventricular Rate:  55 PR Interval:  170 QRS Duration:  78 QT Interval:  456 QTC Calculation: 436 R Axis:   7  Text Interpretation: Sinus bradycardia Nonspecific T wave abnormality When compared with ECG of 12-Aug-2022 15:55, Premature supraventricular complexes are no longer Present Confirmed by Wallis Bamberg ([id number]) on 02/08/2023 10:24:15 AM    Cardiac Studies & Procedures      ECHOCARDIOGRAM  ECHOCARDIOGRAM COMPLETE 05/18/2018  Narrative ECHOCARDIOGRAM REPORT    Patient Name:   Sharon Wise Date of Exam: 05/18/2018 Medical Rec #:  [id number]         Height:       66.0 in Accession #:    [id number]        Weight:       150.0 lb Date of Birth:  [birth year]         BSA:          1.77 m Patient Age:    67 years          BP:           154/94 mmHg Patient Gender: F                 HR:           59 bpm. Exam Location:  [place]   Procedure: 2D Echo  Indications:    Chest pain syndrome [R07.9 (ICD-10-CM)]  History:        Patient has no prior history of Echocardiogram examinations. Signs/Symptoms: Shortness of Breath Risk Factors: Hypertension and Dyslipidemia.  Sonographer:    Louie Boston Referring Phys: Norman Herrlich, J  IMPRESSIONS   1. The left ventricle has normal systolic function with an ejection fraction of 60-65%. The cavity size was normal. Left ventricular diastolic Doppler parameters are consistent with impaired relaxation. 2. The right ventricle has normal systolic function. The cavity was normal. There is no increase in right ventricular wall thickness. 3. Tricuspid valve regurgitation is mild-moderate.  FINDINGS Left Ventricle: The left ventricle has normal systolic function, with an ejection fraction of 60-65%. The cavity size was normal. There is no increase in left ventricular  wall thickness. Left ventricular diastolic Doppler parameters are consistent with impaired relaxation.  Right Ventricle: The right ventricle has normal systolic function. The cavity was normal. There is no increase in right ventricular wall thickness.  Left Atrium: Left atrial size was normal in size.  Right Atrium: Right atrial size was normal in size. Right atrial pressure is estimated at 10 mmHg.  Interatrial Septum: No atrial level shunt detected by color flow Doppler.  Pericardium: There is no evidence of pericardial effusion.  Mitral Valve: The mitral valve is normal in structure. Mitral valve regurgitation is trivial by color flow Doppler.  Tricuspid Valve: The  tricuspid valve is normal in structure. Tricuspid valve regurgitation is mild-moderate by color flow Doppler.  Aortic Valve: The aortic valve is normal in structure. Aortic valve regurgitation was not assessed by color flow Doppler.  Pulmonic Valve: The pulmonic valve was grossly normal. Pulmonic valve regurgitation was not assessed by color flow Doppler.  Venous: The inferior vena cava measures 1.10 cm, is normal in size with greater than 50% respiratory variability.   +--------------+--------++ LEFT VENTRICLE         +----------------+---------++ +--------------+--------++ Diastology                PLAX 2D                +----------------+---------++ +--------------+--------++ LV e' lateral:  6.96 cm/s LVIDd:        4.70 cm  +----------------+---------++ +--------------+--------++ LV E/e' lateral:7.2       LVIDs:        3.10 cm  +----------------+---------++ +--------------+--------++ LV e' medial:   6.42 cm/s LV PW:        1.30 cm  +----------------+---------++ +--------------+--------++ LV E/e' medial: 7.8       LV IVS:       1.10 cm  +----------------+---------++ +--------------+--------++ LVOT diam:    2.00 cm  +--------------+--------++ LV SV:        64 ml    +--------------+--------++ LV SV Index:  36.04    +--------------+--------++ LVOT Area:    3.14 cm +--------------+--------++                        +--------------+--------++  +---------------+----------++ RIGHT VENTRICLE           +---------------+----------++ RV S prime:    14.70 cm/s +---------------+----------++ TAPSE (M-mode):2.1 cm     +---------------+----------++ RVSP:          31.3 mmHg  +---------------+----------++  +---------------+-------++-----------++ LEFT ATRIUM           Index       +---------------+-------++-----------++ LA diam:       3.60 cm2.03 cm/m  +---------------+-------++-----------++ LA  Vol (A2C):  41.8 ml23.62 ml/m +---------------+-------++-----------++ LA Vol (A4C):  29.5 ml16.67 ml/m +---------------+-------++-----------++ LA Biplane Vol:36.2 ml20.45 ml/m +---------------+-------++-----------++ +------------+---------++-----------++ RIGHT ATRIUM         Index       +------------+---------++-----------++ RA Pressure:3.00 mmHg            +------------+---------++-----------++ RA Area:    14.30 cm            +------------+---------++-----------++ RA Volume:  31.80 ml 17.97 ml/m +------------+---------++-----------++ +------------+-----------++ AORTIC VALVE            +------------+-----------++ LVOT Vmax:  68.70 cm/s  +------------+-----------++ LVOT Vmean: 45.900 cm/s +------------+-----------++ LVOT VTI:   0.172 m     +------------+-----------++  +-------------+-------++ AORTA                +-------------+-------++  Ao Root diam:3.50 cm +-------------+-------++ Ao Asc diam: 2.90 cm +-------------+-------++  +--------------+----------++ +---------------+-----------++ MITRAL VALVE             TRICUSPID VALVE            +--------------+----------++ +---------------+-----------++ MV Area (PHT):2.80 cm   TR Peak grad:  28.3 mmHg   +--------------+----------++ +---------------+-----------++ MV PHT:       78.59 msec TR Vmax:       293.00 cm/s +--------------+----------++ +---------------+-----------++ MV Decel Time:271 msec   Estimated RAP: 3.00 mmHg   +--------------+----------++ +---------------+-----------++ +--------------+----------++ RVSP:          31.3 mmHg   MV E velocity:50.00 cm/s +---------------+-----------++ +--------------+----------++ MV A velocity:73.20 cm/s +--------------+-------+ +--------------+----------++ SHUNTS                MV E/A ratio: 0.68       +--------------+-------+ +--------------+----------++  Systemic VTI: 0.17 m  +--------------+-------+ Systemic Diam:2.00 cm +--------------+-------+  +---------+-------+ IVC              +---------+-------+ IVC diam:1.10 cm +---------+-------+   Gypsy Balsam MD Electronically signed by Gypsy Balsam MD Signature Date/Time: 05/18/2018/12:50:06 PM    Final    CT SCANS  CT CORONARY MORPH W/CTA COR W/SCORE 07/25/2018  Addendum 07/25/2018  1:25 PM ADDENDUM REPORT: 07/25/2018 13:23  CLINICAL DATA:  Chest pain  EXAM: Cardiac CTA  MEDICATIONS: Sub lingual nitro. 4mg  x 2  TECHNIQUE: The patient was scanned on a Siemens 192 slice scanner. Gantry rotation speed was 250 msecs. Collimation was 0.6 mm. A 100 kV prospective scan was triggered in the ascending thoracic aorta at 35-75% of the R-R interval. Average HR during the scan was 60 bpm. The 3D data set was interpreted on a dedicated work station using MPR, MIP and VRT modes. A total of 80cc of contrast was used.  FINDINGS: Non-cardiac: See separate report from [facility].  Pulmonary veins drain normally to the left atrium.  Calcium Score: 0.4 Agatston units.  Coronary Arteries: Right dominant with no anomalies  LM: No plaque or stenosis.  LAD system:  No plaque or stenosis.  Circumflex system: No plaque or stenosis.  RCA system: There is misregistration artifact but it does not affect the ability to read the study. Mixed plaque proximal RCA with minimal stenosis  IMPRESSION: 1. Coronary artery calcium score 0.4 Agatston units places the patient in the 43rd percentile for age and gender, suggesting intermediate risk for future cardiac events.  2.  No obstructive CAD.  Dalton Mclean   Electronically Signed By: Marca Ancona M.D. On: 07/25/2018 13:23  Narrative EXAM: OVER-READ INTERPRETATION  CT CHEST  The following report is an over-read performed by radiologist Dr. Jeronimo Greaves of [facility] on 07/25/2018. This  over-read does not include interpretation of cardiac or coronary anatomy or pathology. The coronary CTA interpretation by the cardiologist is attached.  COMPARISON:  06/16/2018 CTA of the chest.  FINDINGS: Vascular: Normal aortic caliber, without dissection. No central pulmonary embolism, on this non-dedicated study.  Mediastinum/Nodes: No imaged thoracic adenopathy.  Lungs/Pleura: No imaged pleural fluid. A 2 mm right lower lobe pulmonary nodule was present on 07/29/2014 and can be presumed benign.  Left lung base scarring or atelectasis.  Upper Abdomen: Normal imaged portions of the liver, spleen, stomach.  Musculoskeletal: Midthoracic spondylosis.  IMPRESSION: No acute extracardiac findings in the imaged chest.  Electronically Signed: By: Jeronimo Greaves M.D. On: 07/25/2018 08:46          Risk Assessment/Calculations:  Physical Exam:   VS:  BP 110/80 (BP Location: Left Arm, Patient Position: Sitting, Cuff Size: Normal)   Pulse (!) 52   Ht 5\' 2"  (1.575 m)   Wt 133 lb (60.3 kg)   LMP  (LMP Unknown)   SpO2 97%   BMI 24.33 kg/m    Wt Readings from Last 3 Encounters:  02/08/23 133 lb (60.3 kg)  08/12/22 151 lb 3.2 oz (68.6 kg)  06/03/20 166 lb 12.8 oz (75.7 kg)    GEN: Well nourished, well developed in no acute distress NECK: No JVD; No carotid bruits CARDIAC: RRR, no murmurs, rubs, gallops RESPIRATORY:  Clear to auscultation without rales, wheezing or rhonchi  ABDOMEN: Soft, non-tender, non-distended EXTREMITIES:  No edema; No deformity   ASSESSMENT AND PLAN: .   Bradycardia-this a purpose of her OV today, she was previously advised she could follow-up on a as needed basis however it was noticed that her heart rate was low during her recent procedure.  She is asymptomatic, denies dizziness, syncope.  Will arrange for a monitor to assess for any pauses.  Her beta-blocker has been held.  CAD -mild nonobstructive CAD per coronary CTA.Stable with no  anginal symptoms. No indication for ischemic evaluation.   Continue isosorbide 30 mg daily, continue pravastatin 20 mg daily.  Hypertension-blood pressure is well-controlled today at 110/80, her antihypertensives have been held by her PCP.  CKD-follows with Dr. Gaynell Face.   Anemia - noted at recent hospital discharge, likely related to CKD. Will repeat CBC.         Dispo: CBC, CMET, 7-day monitor--follow-up based on test results.  Signed, Flossie Dibble, NP

## 2023-02-08 NOTE — Patient Instructions (Addendum)
Medication Instructions:   None today    *If you need a refill on your cardiac medications before your next appointment, please call your pharmacy*   Lab Work:  CMET, CBC  If you have labs (blood work) drawn today and your tests are completely normal, you will receive your results only by: MyChart Message (if you have MyChart) OR A paper copy in the mail If you have any lab test that is abnormal or we need to change your treatment, we will call you to review the results.   Testing/Procedures: You have been asked to wear a [employer] Heart Monitor today. It is to be worn for 7 days. Please remove the monitor on 02/15/23 and mail back in the box provided.  If you have any questions about the monitor please call the company at [phone]     Follow-Up: At [facility], you and your health needs are our priority.  As part of our continuing mission to provide you with exceptional heart care, we have created designated Provider Care Teams.  These Care Teams include your primary Cardiologist (physician) and Advanced Practice Providers (APPs -  Physician Assistants and Nurse Practitioners) who all work together to provide you with the care you need, when you need it.  We recommend signing up for the patient portal called "MyChart".  Sign up information is provided on this After Visit Summary.  MyChart is used to connect with patients for Virtual Visits (Telemedicine).  Patients are able to view lab/test results, encounter notes, upcoming appointments, etc.  Non-urgent messages can be sent to your provider as well.   To learn more about what you can do with MyChart, go to [email or web address].    Your next appointment:   Follow up based on testing  Provider:   Dr. Dulce Sellar   Other Instructions  Drink water, change positions slowly.

## 2023-02-09 ENCOUNTER — Telehealth: Payer: Self-pay | Admitting: Emergency Medicine

## 2023-02-09 LAB — COMPREHENSIVE METABOLIC PANEL WITH GFR
ALT: 11 IU/L (ref 0–32)
AST: 19 IU/L (ref 0–40)
Albumin: 3.9 g/dL (ref 3.8–4.8)
Alkaline Phosphatase: 163 IU/L — ABNORMAL HIGH (ref 44–121)
BUN/Creatinine Ratio: 10 — ABNORMAL LOW (ref 12–28)
BUN: 13 mg/dL (ref 8–27)
Bilirubin Total: 0.3 mg/dL (ref 0.0–1.2)
CO2: 24 mmol/L (ref 20–29)
Calcium: 9.5 mg/dL (ref 8.7–10.3)
Chloride: 108 mmol/L — ABNORMAL HIGH (ref 96–106)
Creatinine, Ser: 1.32 mg/dL — ABNORMAL HIGH (ref 0.57–1.00)
Globulin, Total: 2.4 g/dL (ref 1.5–4.5)
Glucose: 81 mg/dL (ref 70–99)
Potassium: 3.9 mmol/L (ref 3.5–5.2)
Sodium: 147 mmol/L — ABNORMAL HIGH (ref 134–144)
Total Protein: 6.3 g/dL (ref 6.0–8.5)
eGFR: 42 mL/min/1.73 — ABNORMAL LOW

## 2023-02-09 LAB — CBC WITH DIFFERENTIAL/PLATELET
Basophils Absolute: 0 x10E3/uL (ref 0.0–0.2)
Basos: 1 %
EOS (ABSOLUTE): 0 x10E3/uL (ref 0.0–0.4)
Eos: 1 %
Hematocrit: 36.8 % (ref 34.0–46.6)
Hemoglobin: 11.4 g/dL (ref 11.1–15.9)
Immature Grans (Abs): 0 x10E3/uL (ref 0.0–0.1)
Immature Granulocytes: 0 %
Lymphocytes Absolute: 2.1 x10E3/uL (ref 0.7–3.1)
Lymphs: 49 %
MCH: 29.5 pg (ref 26.6–33.0)
MCHC: 31 g/dL — ABNORMAL LOW (ref 31.5–35.7)
MCV: 95 fL (ref 79–97)
Monocytes Absolute: 0.5 x10E3/uL (ref 0.1–0.9)
Monocytes: 12 %
Neutrophils Absolute: 1.5 x10E3/uL (ref 1.4–7.0)
Neutrophils: 37 %
Platelets: 210 x10E3/uL (ref 150–450)
RBC: 3.87 x10E6/uL (ref 3.77–5.28)
RDW: 11.9 % (ref 11.7–15.4)
WBC: 4.2 x10E3/uL (ref 3.4–10.8)

## 2023-02-09 NOTE — Telephone Encounter (Signed)
Results reviewed with pt as per Wallis Bamberg NP's note.  Pt verbalized understanding and had no additional questions. Routed to PCP.

## 2023-02-09 NOTE — Telephone Encounter (Signed)
-----   Message from Flossie Dibble sent at 02/09/2023  7:38 AM EST ----- Labs are overall stable.  She has stable chronic kidney disease.  Her sodium is a little bit high, this might mean she is not drinking enough water.  Please encourage her to drink more water throughout the day.

## 2023-02-14 DIAGNOSIS — I249 Acute ischemic heart disease, unspecified: Secondary | ICD-10-CM | POA: Diagnosis not present

## 2023-02-14 DIAGNOSIS — R079 Chest pain, unspecified: Secondary | ICD-10-CM | POA: Diagnosis not present

## 2023-02-14 DIAGNOSIS — D631 Anemia in chronic kidney disease: Secondary | ICD-10-CM | POA: Diagnosis not present

## 2023-02-14 DIAGNOSIS — N183 Chronic kidney disease, stage 3 unspecified: Secondary | ICD-10-CM | POA: Diagnosis not present

## 2023-02-14 DIAGNOSIS — Z79899 Other long term (current) drug therapy: Secondary | ICD-10-CM | POA: Diagnosis not present

## 2023-02-14 DIAGNOSIS — N189 Chronic kidney disease, unspecified: Secondary | ICD-10-CM | POA: Diagnosis not present

## 2023-02-14 DIAGNOSIS — J449 Chronic obstructive pulmonary disease, unspecified: Secondary | ICD-10-CM | POA: Diagnosis not present

## 2023-02-14 DIAGNOSIS — M940 Chondrocostal junction syndrome [Tietze]: Secondary | ICD-10-CM | POA: Diagnosis not present

## 2023-02-14 DIAGNOSIS — R918 Other nonspecific abnormal finding of lung field: Secondary | ICD-10-CM | POA: Diagnosis not present

## 2023-02-14 DIAGNOSIS — K219 Gastro-esophageal reflux disease without esophagitis: Secondary | ICD-10-CM | POA: Diagnosis not present

## 2023-02-14 DIAGNOSIS — I129 Hypertensive chronic kidney disease with stage 1 through stage 4 chronic kidney disease, or unspecified chronic kidney disease: Secondary | ICD-10-CM | POA: Diagnosis not present

## 2023-02-14 DIAGNOSIS — J811 Chronic pulmonary edema: Secondary | ICD-10-CM | POA: Diagnosis not present

## 2023-02-14 DIAGNOSIS — E1122 Type 2 diabetes mellitus with diabetic chronic kidney disease: Secondary | ICD-10-CM | POA: Diagnosis not present

## 2023-02-14 DIAGNOSIS — R0789 Other chest pain: Secondary | ICD-10-CM | POA: Diagnosis not present

## 2023-02-14 DIAGNOSIS — R9431 Abnormal electrocardiogram [ECG] [EKG]: Secondary | ICD-10-CM | POA: Diagnosis not present

## 2023-02-14 DIAGNOSIS — E785 Hyperlipidemia, unspecified: Secondary | ICD-10-CM | POA: Diagnosis not present

## 2023-02-15 DIAGNOSIS — M940 Chondrocostal junction syndrome [Tietze]: Secondary | ICD-10-CM | POA: Diagnosis not present

## 2023-02-15 DIAGNOSIS — I493 Ventricular premature depolarization: Secondary | ICD-10-CM | POA: Diagnosis not present

## 2023-02-15 DIAGNOSIS — R079 Chest pain, unspecified: Secondary | ICD-10-CM | POA: Diagnosis not present

## 2023-02-15 DIAGNOSIS — N189 Chronic kidney disease, unspecified: Secondary | ICD-10-CM | POA: Diagnosis not present

## 2023-02-19 DIAGNOSIS — G309 Alzheimer's disease, unspecified: Secondary | ICD-10-CM | POA: Diagnosis not present

## 2023-02-19 DIAGNOSIS — N183 Chronic kidney disease, stage 3 unspecified: Secondary | ICD-10-CM | POA: Diagnosis not present

## 2023-02-19 DIAGNOSIS — E876 Hypokalemia: Secondary | ICD-10-CM | POA: Diagnosis not present

## 2023-02-19 DIAGNOSIS — R110 Nausea: Secondary | ICD-10-CM | POA: Diagnosis not present

## 2023-02-19 DIAGNOSIS — R079 Chest pain, unspecified: Secondary | ICD-10-CM | POA: Diagnosis not present

## 2023-02-19 DIAGNOSIS — D638 Anemia in other chronic diseases classified elsewhere: Secondary | ICD-10-CM | POA: Diagnosis not present

## 2023-02-19 DIAGNOSIS — M940 Chondrocostal junction syndrome [Tietze]: Secondary | ICD-10-CM | POA: Diagnosis not present

## 2023-02-24 DIAGNOSIS — R002 Palpitations: Secondary | ICD-10-CM | POA: Diagnosis not present

## 2023-03-02 ENCOUNTER — Telehealth: Payer: Self-pay | Admitting: Emergency Medicine

## 2023-03-02 MED ORDER — APIXABAN 5 MG PO TABS: 5.0000 mg | ORAL_TABLET | Freq: Two times a day (BID) | ORAL | 1 refills | Status: DC

## 2023-03-02 NOTE — Addendum Note (Signed)
Addended by: Lonia Farber on: 03/02/2023 12:44 PM   Modules accepted: Orders

## 2023-03-02 NOTE — Telephone Encounter (Signed)
Spoke to patient and reviewed Jimmy Footman note regarding results and recommendations. Patient verbalized understanding. Rx sent to the pharmacy. Patient reports having chest pressure at bed time. Wallis Bamberg, NP made aware and gave recommendation for patient to start taking imdur at bed time, and have an office visit in two weeks.  Patient was made aware and verbalized understanding. She had no further questions. Follow up appointment made in two weeks as per [facility] recommendation.

## 2023-03-02 NOTE — Telephone Encounter (Signed)
Pt returning call, requesting cb

## 2023-03-02 NOTE — Telephone Encounter (Signed)
LVM 2/11

## 2023-03-02 NOTE — Telephone Encounter (Signed)
-----   Message from Flossie Dibble sent at 03/01/2023  9:23 AM EST ----- Heart monitor revealed that on average she is in sinus rhythm and her heart rate is around 70 bpm--this is normal.  It also revealed that she has episodes of atrial fibrillation, as well as atrial flutter--which needs to be treated as this could cause a stroke. Start Eliquis 5 mg twice daily.   Have her follow up with Dr. Dulce Sellar -- she will need an appointment to see him in about 4-6 weeks.

## 2023-03-08 DIAGNOSIS — G4733 Obstructive sleep apnea (adult) (pediatric): Secondary | ICD-10-CM | POA: Diagnosis not present

## 2023-03-15 DIAGNOSIS — I491 Atrial premature depolarization: Secondary | ICD-10-CM | POA: Diagnosis not present

## 2023-03-15 DIAGNOSIS — R001 Bradycardia, unspecified: Secondary | ICD-10-CM | POA: Diagnosis not present

## 2023-03-15 DIAGNOSIS — I10 Essential (primary) hypertension: Secondary | ICD-10-CM | POA: Diagnosis not present

## 2023-03-15 DIAGNOSIS — R5381 Other malaise: Secondary | ICD-10-CM | POA: Diagnosis not present

## 2023-03-15 DIAGNOSIS — R079 Chest pain, unspecified: Secondary | ICD-10-CM | POA: Diagnosis not present

## 2023-03-15 DIAGNOSIS — Z79899 Other long term (current) drug therapy: Secondary | ICD-10-CM | POA: Diagnosis not present

## 2023-03-15 DIAGNOSIS — R531 Weakness: Secondary | ICD-10-CM | POA: Diagnosis not present

## 2023-03-15 NOTE — Progress Notes (Unsigned)
 Cardiology Office Note:  .   Date:  03/16/2023  ID:  Sharon Wise, DOB 13-Sep-1950, MRN [id number] PCP: Paulina Fusi, MD  [facility] Providers Cardiologist:  Norman Herrlich, MD    History of Present Illness: .   Sharon Wise is a 73 y.o. female with a past medical history of hypertension, paroxysmal atrial fibrillation on Eliquis, Alzheimer's disease, CKD--follows with nephrology, dyslipidemia, mild nonobstructive CAD per coronary CTA.  02/08/2023 monitor average heart rate 72 bpm, predominant rhythm was sinus, 5 triggered events associated with APCs, no pauses greater than 3 seconds, atrial fibrillation occurred 1% of the time, atrial flutter occurred less than 1% of the time, SVE's were frequent at 9.4% 03/28/2022 echo EF 55 to 60%, impaired relaxation, trivial MR, mild to moderate pulmonic valve regurgitation, moderate TR, PASP mildly elevated at 43 mmHg 06/03/2021 Lexiscan no diagnostic changes 06/14/2020 coronary CTA calcium score of 0.4, 43rd percentile, nonobstructive CAD  Most recently evaluated by Dr. Dulce Sellar on 08/12/2022, was advised she could follow-up as needed.  Evaluated 02/08/2023 for follow-up after recently undergoing an upper endoscopy and noted that she was having some arrhythmias.  She had no formal complaints. A monitor was arranged which revealed episodes of atrial fibrillation, atrial flutter, SVE's frequent at 9.4%.  She was started her on Eliquis 5 mg twice daily.  She then called in complaining of chest discomfort when she lay down at night, we changed her Imdur to at night and advised her to follow-up in 2 weeks.    She presents today for follow-up of her atrial fibrillation.  She was evaluated in the emergency department on 03/15/2023 for an episode of chest pain and weakness, troponins were negative, EKG was normal, felt to be most consistent with a viral illness that she was treated for flu although her influenza swab was negative.  She has no medical  complaints from a cardiac perspective, but is bothered by weakness and fever. She denies chest pain, palpitations, dyspnea, pnd, orthopnea, n, v, dizziness, syncope, edema, weight gain, or early satiety.   ROS: Review of Systems  Constitutional:  Positive for fever and malaise/fatigue.  All other systems reviewed and are negative.    Studies Reviewed: Marland Kitchen   EKG Interpretation Date/Time:  Tuesday March 16 2023 09:55:56 EST Ventricular Rate:  57 PR Interval:  188 QRS Duration:  78 QT Interval:  452 QTC Calculation: 439 R Axis:   12  Text Interpretation: Sinus bradycardia Nonspecific T wave abnormality When compared with ECG of 08-Feb-2023 10:21, No significant change was found Confirmed by Wallis Bamberg [phone]) on 03/16/2023 9:59:28 AM    Cardiac Studies & Procedures   ______________________________________________________________________________________________     ECHOCARDIOGRAM  ECHOCARDIOGRAM COMPLETE 05/18/2018  Narrative ECHOCARDIOGRAM REPORT    Patient Name:   Sharon Wise Date of Exam: 05/18/2018 Medical Rec #:  [id number]         Height:       66.0 in Accession #:    [id number]        Weight:       150.0 lb Date of Birth:  25-May-1950         BSA:          1.77 m Patient Age:    67 years          BP:           154/94 mmHg Patient Gender: F  HR:           59 bpm. Exam Location:     Procedure: 2D Echo  Indications:    Chest pain syndrome [R07.9 (ICD-10-CM)]  History:        Patient has no prior history of Echocardiogram examinations. Signs/Symptoms: Shortness of Breath Risk Factors: Hypertension and Dyslipidemia.  Sonographer:    Louie Boston Referring Phys: Norman Herrlich, J  IMPRESSIONS   1. The left ventricle has normal systolic function with an ejection fraction of 60-65%. The cavity size was normal. Left ventricular diastolic Doppler parameters are consistent with impaired relaxation. 2. The right ventricle has normal systolic  function. The cavity was normal. There is no increase in right ventricular wall thickness. 3. Tricuspid valve regurgitation is mild-moderate.  FINDINGS Left Ventricle: The left ventricle has normal systolic function, with an ejection fraction of 60-65%. The cavity size was normal. There is no increase in left ventricular wall thickness. Left ventricular diastolic Doppler parameters are consistent with impaired relaxation.  Right Ventricle: The right ventricle has normal systolic function. The cavity was normal. There is no increase in right ventricular wall thickness.  Left Atrium: Left atrial size was normal in size.  Right Atrium: Right atrial size was normal in size. Right atrial pressure is estimated at 10 mmHg.  Interatrial Septum: No atrial level shunt detected by color flow Doppler.  Pericardium: There is no evidence of pericardial effusion.  Mitral Valve: The mitral valve is normal in structure. Mitral valve regurgitation is trivial by color flow Doppler.  Tricuspid Valve: The tricuspid valve is normal in structure. Tricuspid valve regurgitation is mild-moderate by color flow Doppler.  Aortic Valve: The aortic valve is normal in structure. Aortic valve regurgitation was not assessed by color flow Doppler.  Pulmonic Valve: The pulmonic valve was grossly normal. Pulmonic valve regurgitation was not assessed by color flow Doppler.  Venous: The inferior vena cava measures 1.10 cm, is normal in size with greater than 50% respiratory variability.   +--------------+--------++ LEFT VENTRICLE         +----------------+---------++ +--------------+--------++ Diastology                PLAX 2D                +----------------+---------++ +--------------+--------++ LV e' lateral:  6.96 cm/s LVIDd:        4.70 cm  +----------------+---------++ +--------------+--------++ LV E/e' lateral:7.2       LVIDs:        3.10 cm   +----------------+---------++ +--------------+--------++ LV e' medial:   6.42 cm/s LV PW:        1.30 cm  +----------------+---------++ +--------------+--------++ LV E/e' medial: 7.8       LV IVS:       1.10 cm  +----------------+---------++ +--------------+--------++ LVOT diam:    2.00 cm  +--------------+--------++ LV SV:        64 ml    +--------------+--------++ LV SV Index:  36.04    +--------------+--------++ LVOT Area:    3.14 cm +--------------+--------++                        +--------------+--------++  +---------------+----------++ RIGHT VENTRICLE           +---------------+----------++ RV S prime:    14.70 cm/s +---------------+----------++ TAPSE (M-mode):2.1 cm     +---------------+----------++ RVSP:          31.3 mmHg  +---------------+----------++  +---------------+-------++-----------++ LEFT ATRIUM  Index       +---------------+-------++-----------++ LA diam:       3.60 cm2.03 cm/m  +---------------+-------++-----------++ LA Vol (A2C):  41.8 ml23.62 ml/m +---------------+-------++-----------++ LA Vol (A4C):  29.5 ml16.67 ml/m +---------------+-------++-----------++ LA Biplane Vol:36.2 ml20.45 ml/m +---------------+-------++-----------++ +------------+---------++-----------++ RIGHT ATRIUM         Index       +------------+---------++-----------++ RA Pressure:3.00 mmHg            +------------+---------++-----------++ RA Area:    14.30 cm            +------------+---------++-----------++ RA Volume:  31.80 ml 17.97 ml/m +------------+---------++-----------++ +------------+-----------++ AORTIC VALVE            +------------+-----------++ LVOT Vmax:  68.70 cm/s  +------------+-----------++ LVOT Vmean: 45.900 cm/s +------------+-----------++ LVOT VTI:   0.172 m      +------------+-----------++  +-------------+-------++ AORTA                +-------------+-------++ Ao Root diam:3.50 cm +-------------+-------++ Ao Asc diam: 2.90 cm +-------------+-------++  +--------------+----------++ +---------------+-----------++ MITRAL VALVE             TRICUSPID VALVE            +--------------+----------++ +---------------+-----------++ MV Area (PHT):2.80 cm   TR Peak grad:  28.3 mmHg   +--------------+----------++ +---------------+-----------++ MV PHT:       78.59 msec TR Vmax:       293.00 cm/s +--------------+----------++ +---------------+-----------++ MV Decel Time:271 msec   Estimated RAP: 3.00 mmHg   +--------------+----------++ +---------------+-----------++ +--------------+----------++ RVSP:          31.3 mmHg   MV E velocity:50.00 cm/s +---------------+-----------++ +--------------+----------++ MV A velocity:73.20 cm/s +--------------+-------+ +--------------+----------++ SHUNTS                MV E/A ratio: 0.68       +--------------+-------+ +--------------+----------++ Systemic VTI: 0.17 m  +--------------+-------+ Systemic Diam:2.00 cm +--------------+-------+  +---------+-------+ IVC              +---------+-------+ IVC diam:1.10 cm +---------+-------+   Gypsy Balsam MD Electronically signed by Gypsy Balsam MD Signature Date/Time: 05/18/2018/12:50:06 PM    Final    MONITORS  LONG TERM MONITOR (3-14 DAYS) 02/25/2023  Narrative Patch Wear Time:  8 days and 11 hours (2025-01-20T10:47:56-0500 to 2025-01-28T22:29:37-0500)  Patient had a min HR of 40 bpm, max HR of 171 bpm, and avg HR of 72 bpm. Predominant underlying rhythm was Sinus Rhythm.  There are 5 triggered and 3 diary events all sinus rhythm generally associated APCs frequent APCs and brief runs of APCs.  There were no sinus pauses of 3 seconds or greater no episodes of second or  third-degree AV nodal block.  Atrial fibrillation was seen less than 1% burden 1 episode lasting 1 minute 41 seconds with a controlled ventricular rate.  There were no pauses at termination.  2547 Supraventricular Tachycardia runs occurred, the run with the fastest interval lasting 5 beats with a max rate of 171 bpm, the longest lasting 23.2 secs with an avg rate of 96 bpm.  Atrial Flutter occurred (<1% burden), ranging from 55-78 bpm (avg of 63 bpm), the longest lasting 1 min 41 secs with an avg rate of 63 bpm.  Isolated SVEs were frequent (9.4%, 72406), SVE Couplets were frequent (7.4%, 16109), and SVE Triplets were occasional (2.8%, 7111).  Isolated VEs were rare (<1.0%), VE Couplets were rare (<1.0%), and no VE Triplets were present. Ventricular Bigeminy was present.   CT SCANS  CT CORONARY MORPH W/CTA COR W/SCORE 07/25/2018  Addendum 07/25/2018  1:25 PM ADDENDUM REPORT: 07/25/2018 13:23  CLINICAL DATA:  Chest pain  EXAM: Cardiac CTA  MEDICATIONS: Sub lingual nitro. 4mg  x 2  TECHNIQUE: The patient was scanned on a Siemens 192 slice scanner. Gantry rotation speed was 250 msecs. Collimation was 0.6 mm. A 100 kV prospective scan was triggered in the ascending thoracic aorta at 35-75% of the R-R interval. Average HR during the scan was 60 bpm. The 3D data set was interpreted on a dedicated work station using MPR, MIP and VRT modes. A total of 80cc of contrast was used.  FINDINGS: Non-cardiac: See separate report from [facility].  Pulmonary veins drain normally to the left atrium.  Calcium Score: 0.4 Agatston units.  Coronary Arteries: Right dominant with no anomalies  LM: No plaque or stenosis.  LAD system:  No plaque or stenosis.  Circumflex system: No plaque or stenosis.  RCA system: There is misregistration artifact but it does not affect the ability to read the study. Mixed plaque proximal RCA with minimal stenosis  IMPRESSION: 1. Coronary artery  calcium score 0.4 Agatston units places the patient in the 43rd percentile for age and gender, suggesting intermediate risk for future cardiac events.  2.  No obstructive CAD.  Dalton Mclean   Electronically Signed By: Marca Ancona M.D. On: 07/25/2018 13:23  Narrative EXAM: OVER-READ INTERPRETATION  CT CHEST  The following report is an over-read performed by radiologist Dr. Jeronimo Greaves of [facility] on 07/25/2018. This over-read does not include interpretation of cardiac or coronary anatomy or pathology. The coronary CTA interpretation by the cardiologist is attached.  COMPARISON:  06/16/2018 CTA of the chest.  FINDINGS: Vascular: Normal aortic caliber, without dissection. No central pulmonary embolism, on this non-dedicated study.  Mediastinum/Nodes: No imaged thoracic adenopathy.  Lungs/Pleura: No imaged pleural fluid. A 2 mm right lower lobe pulmonary nodule was present on 07/29/2014 and can be presumed benign.  Left lung base scarring or atelectasis.  Upper Abdomen: Normal imaged portions of the liver, spleen, stomach.  Musculoskeletal: Midthoracic spondylosis.  IMPRESSION: No acute extracardiac findings in the imaged chest.  Electronically Signed: By: Jeronimo Greaves M.D. On: 07/25/2018 08:46     ______________________________________________________________________________________________      Risk Assessment/Calculations:         CHA2DS2-VASc Score = 4   This indicates a 4.8% annual risk of stroke. The patient's score is based upon: CHF History: 0 HTN History: 1 Diabetes History: 0 Stroke History: 0 Vascular Disease History: 1 Age Score: 1 Gender Score: 1         Physical Exam:   VS:  BP 100/80 (BP Location: Left Arm, Patient Position: Sitting, Cuff Size: Normal)   Pulse (!) 57   Ht 5\' 2"  (1.575 m)   Wt 131 lb (59.4 kg)   LMP  (LMP Unknown)   SpO2 98%   BMI 23.96 kg/m    Wt Readings from Last 3 Encounters:  03/16/23 131  lb (59.4 kg)  02/08/23 133 lb (60.3 kg)  08/12/22 151 lb 3.2 oz (68.6 kg)    GEN: Well nourished, well developed in no acute distress NECK: No JVD; No carotid bruits CARDIAC: RRR, no murmurs, rubs, gallops RESPIRATORY:  Clear to auscultation without rales, wheezing or rhonchi  ABDOMEN: Soft, non-tender, non-distended EXTREMITIES:  No edema; No deformity   ASSESSMENT AND PLAN: .   Paroxysmal atrial fibrillation/hypercoagulable-CHA2DS2-VASc score of 4, she is maintaining sinus rhythm, recent monitor revealed atrial fibrillation and atrial flutter occurring approximately 1%  of the time, she is currently on Eliquis 5 mg twice daily-no indication for dose reduction.  Labs on 03/15/2023 revealed creatinine 1.40, hemoglobin 10.2, hematocrit 31.2.  Bradycardia-this a purpose of her OV today, she was previously advised she could follow-up on a as needed basis however it was noticed that her heart rate was low during her recent procedure.  She is asymptomatic, denies dizziness, syncope.  Will arrange for a monitor to assess for any pauses.  Her beta-blocker has been held.  CAD -mild nonobstructive CAD per coronary CTA.Stable with no anginal symptoms. No indication for ischemic evaluation.   Continue isosorbide 30 mg daily, continue pravastatin 20 mg daily.  Hypertension-she is not entirely sure of her medications however on her med list is Apresoline and midodrine, likely she is not taking the Apresoline anymore we will discontinue this from her medication list.  CKD-follows with Dr. Gaynell Face.   Anemia - noted at recent hospital discharge, likely related to CKD.         Dispo: 6 weeks with Dr. Dulce Sellar.   Signed, Flossie Dibble, NP

## 2023-03-16 ENCOUNTER — Encounter: Payer: Self-pay | Admitting: Cardiology

## 2023-03-16 ENCOUNTER — Ambulatory Visit: Payer: 59 | Attending: Cardiology | Admitting: Cardiology

## 2023-03-16 VITALS — BP 100/80 | HR 57 | Ht 62.0 in | Wt 131.0 lb

## 2023-03-16 DIAGNOSIS — I10 Essential (primary) hypertension: Secondary | ICD-10-CM

## 2023-03-16 DIAGNOSIS — D6859 Other primary thrombophilia: Secondary | ICD-10-CM

## 2023-03-16 DIAGNOSIS — I2510 Atherosclerotic heart disease of native coronary artery without angina pectoris: Secondary | ICD-10-CM | POA: Diagnosis not present

## 2023-03-16 DIAGNOSIS — E782 Mixed hyperlipidemia: Secondary | ICD-10-CM | POA: Diagnosis not present

## 2023-03-16 DIAGNOSIS — I480 Paroxysmal atrial fibrillation: Secondary | ICD-10-CM | POA: Diagnosis not present

## 2023-03-16 DIAGNOSIS — D631 Anemia in chronic kidney disease: Secondary | ICD-10-CM | POA: Diagnosis not present

## 2023-03-16 DIAGNOSIS — N182 Chronic kidney disease, stage 2 (mild): Secondary | ICD-10-CM

## 2023-03-16 NOTE — Patient Instructions (Signed)
 Medication Instructions:  Your physician recommends that you continue on your current medications as directed. Please refer to the Current Medication list given to you today.  *If you need a refill on your cardiac medications before your next appointment, please call your pharmacy*   Lab Work: None Ordered If you have labs (blood work) drawn today and your tests are completely normal, you will receive your results only by: MyChart Message (if you have MyChart) OR A paper copy in the mail If you have any lab test that is abnormal or we need to change your treatment, we will call you to review the results.   Testing/Procedures: None Ordered   Follow-Up: At [facility], you and your health needs are our priority.  As part of our continuing mission to provide you with exceptional heart care, we have created designated Provider Care Teams.  These Care Teams include your primary Cardiologist (physician) and Advanced Practice Providers (APPs -  Physician Assistants and Nurse Practitioners) who all work together to provide you with the care you need, when you need it.  We recommend signing up for the patient portal called "MyChart".  Sign up information is provided on this After Visit Summary.  MyChart is used to connect with patients for Virtual Visits (Telemedicine).  Patients are able to view lab/test results, encounter notes, upcoming appointments, etc.  Non-urgent messages can be sent to your provider as well.   To learn more about what you can do with MyChart, go to [email or web address].    Your next appointment:   6 week(s)  The format for your next appointment:   In Person  Provider:   Wallis Bamberg, NP    Other Instructions NA

## 2023-03-29 DIAGNOSIS — E119 Type 2 diabetes mellitus without complications: Secondary | ICD-10-CM | POA: Diagnosis not present

## 2023-03-29 DIAGNOSIS — Z961 Presence of intraocular lens: Secondary | ICD-10-CM | POA: Diagnosis not present

## 2023-03-30 ENCOUNTER — Other Ambulatory Visit: Payer: Self-pay | Admitting: Cardiology

## 2023-03-30 NOTE — Telephone Encounter (Signed)
 [facility] requesting refill on Eliquis 5mg  1 Bid

## 2023-03-30 NOTE — Telephone Encounter (Signed)
 Prescription refill request for Eliquis received. Indication:afib Last office visit:2/25 Scr:1.32  1/25 Age: 73 Weight:59.4  kg  Prescription refilled

## 2023-03-31 DIAGNOSIS — R634 Abnormal weight loss: Secondary | ICD-10-CM | POA: Diagnosis not present

## 2023-03-31 DIAGNOSIS — Z800 Family history of malignant neoplasm of digestive organs: Secondary | ICD-10-CM | POA: Diagnosis not present

## 2023-03-31 DIAGNOSIS — R109 Unspecified abdominal pain: Secondary | ICD-10-CM | POA: Diagnosis not present

## 2023-03-31 DIAGNOSIS — N183 Chronic kidney disease, stage 3 unspecified: Secondary | ICD-10-CM | POA: Diagnosis not present

## 2023-03-31 DIAGNOSIS — R1310 Dysphagia, unspecified: Secondary | ICD-10-CM | POA: Diagnosis not present

## 2023-03-31 DIAGNOSIS — K219 Gastro-esophageal reflux disease without esophagitis: Secondary | ICD-10-CM | POA: Diagnosis not present

## 2023-04-09 DIAGNOSIS — E876 Hypokalemia: Secondary | ICD-10-CM | POA: Diagnosis not present

## 2023-04-09 DIAGNOSIS — M62838 Other muscle spasm: Secondary | ICD-10-CM | POA: Diagnosis not present

## 2023-04-09 DIAGNOSIS — G2581 Restless legs syndrome: Secondary | ICD-10-CM | POA: Diagnosis not present

## 2023-04-15 DIAGNOSIS — Z96659 Presence of unspecified artificial knee joint: Secondary | ICD-10-CM | POA: Diagnosis not present

## 2023-04-15 DIAGNOSIS — T8484XA Pain due to internal orthopedic prosthetic devices, implants and grafts, initial encounter: Secondary | ICD-10-CM | POA: Diagnosis not present

## 2023-04-18 DIAGNOSIS — M5432 Sciatica, left side: Secondary | ICD-10-CM | POA: Diagnosis not present

## 2023-04-24 DIAGNOSIS — G309 Alzheimer's disease, unspecified: Secondary | ICD-10-CM | POA: Diagnosis not present

## 2023-04-24 DIAGNOSIS — Z809 Family history of malignant neoplasm, unspecified: Secondary | ICD-10-CM | POA: Diagnosis not present

## 2023-04-24 DIAGNOSIS — Z79899 Other long term (current) drug therapy: Secondary | ICD-10-CM | POA: Diagnosis not present

## 2023-04-24 DIAGNOSIS — F325 Major depressive disorder, single episode, in full remission: Secondary | ICD-10-CM | POA: Diagnosis not present

## 2023-04-24 DIAGNOSIS — Z833 Family history of diabetes mellitus: Secondary | ICD-10-CM | POA: Diagnosis not present

## 2023-04-24 DIAGNOSIS — Z880 Allergy status to penicillin: Secondary | ICD-10-CM | POA: Diagnosis not present

## 2023-04-24 DIAGNOSIS — Z8249 Family history of ischemic heart disease and other diseases of the circulatory system: Secondary | ICD-10-CM | POA: Diagnosis not present

## 2023-04-24 DIAGNOSIS — Z9181 History of falling: Secondary | ICD-10-CM | POA: Diagnosis not present

## 2023-04-24 DIAGNOSIS — E119 Type 2 diabetes mellitus without complications: Secondary | ICD-10-CM | POA: Diagnosis not present

## 2023-04-24 DIAGNOSIS — I2510 Atherosclerotic heart disease of native coronary artery without angina pectoris: Secondary | ICD-10-CM | POA: Diagnosis not present

## 2023-04-24 DIAGNOSIS — Z008 Encounter for other general examination: Secondary | ICD-10-CM | POA: Diagnosis not present

## 2023-04-24 DIAGNOSIS — K219 Gastro-esophageal reflux disease without esophagitis: Secondary | ICD-10-CM | POA: Diagnosis not present

## 2023-04-24 DIAGNOSIS — I509 Heart failure, unspecified: Secondary | ICD-10-CM | POA: Diagnosis not present

## 2023-04-26 DIAGNOSIS — Z0189 Encounter for other specified special examinations: Secondary | ICD-10-CM | POA: Diagnosis not present

## 2023-04-26 DIAGNOSIS — R1310 Dysphagia, unspecified: Secondary | ICD-10-CM | POA: Diagnosis not present

## 2023-04-27 ENCOUNTER — Ambulatory Visit: Payer: 59 | Admitting: Cardiology

## 2023-04-27 ENCOUNTER — Ambulatory Visit

## 2023-04-27 VITALS — BP 118/64 | HR 74 | Ht 62.0 in | Wt 124.0 lb

## 2023-04-27 DIAGNOSIS — I5032 Chronic diastolic (congestive) heart failure: Secondary | ICD-10-CM | POA: Diagnosis not present

## 2023-04-27 DIAGNOSIS — I480 Paroxysmal atrial fibrillation: Secondary | ICD-10-CM | POA: Diagnosis not present

## 2023-04-27 DIAGNOSIS — E782 Mixed hyperlipidemia: Secondary | ICD-10-CM | POA: Diagnosis not present

## 2023-04-27 DIAGNOSIS — I10 Essential (primary) hypertension: Secondary | ICD-10-CM | POA: Diagnosis not present

## 2023-04-27 HISTORY — DX: Paroxysmal atrial fibrillation: I48.0

## 2023-04-27 HISTORY — DX: Chronic diastolic (congestive) heart failure: I50.32

## 2023-04-27 MED ORDER — TORSEMIDE 20 MG PO TABS: 20.0000 mg | ORAL_TABLET | ORAL | 2 refills | Status: AC

## 2023-04-27 NOTE — Assessment & Plan Note (Signed)
 Remains on pravastatin 20 mg daily. Last lipid panel 2024 total cholesterol 181, HDL 89, LDL 81, triglycerides 56. Continue with current dose.

## 2023-04-27 NOTE — Patient Instructions (Addendum)
 Medication Instructions:   Take Torsemide 20 mg every other day.  *If you need a refill on your cardiac medications before your next appointment, please call your pharmacy*   Lab Work: None Ordered If you have labs (blood work) drawn today and your tests are completely normal, you will receive your results only by: MyChart Message (if you have MyChart) OR A paper copy in the mail If you have any lab test that is abnormal or we need to change your treatment, we will call you to review the results.   Testing/Procedures: None Ordered   Follow-Up: At [facility], you and your health needs are our priority.  As part of our continuing mission to provide you with exceptional heart care, we have created designated Provider Care Teams.  These Care Teams include your primary Cardiologist (physician) and Advanced Practice Providers (APPs -  Physician Assistants and Nurse Practitioners) who all work together to provide you with the care you need, when you need it.  We recommend signing up for the patient portal called "MyChart".  Sign up information is provided on this After Visit Summary.  MyChart is used to connect with patients for Virtual Visits (Telemedicine).  Patients are able to view lab/test results, encounter notes, upcoming appointments, etc.  Non-urgent messages can be sent to your provider as well.   To learn more about what you can do with MyChart, go to [email or web address].    Your next appointment:   6 month follow up

## 2023-04-27 NOTE — Assessment & Plan Note (Signed)
 Well-controlled.  She is on a small dose of Imdur 30 mg once daily for angina.  Otherwise no other significant blood pressure lowering medications. Continue target below 140/90 mmHg.

## 2023-04-27 NOTE — Assessment & Plan Note (Addendum)
 Normal biventricular function on prior echocardiogram March 2024 with LVEF 55 to 60% and RVSP 43 mmHg with moderate TR. Appears compensated euvolemic.  In fact may be slightly volume depleted. Has lost 6- 7 pounds in the past 1 month. She is good about salt restriction. Lowered the dose of torsemide to 20 mg every other day rather than daily.  With relatively soft blood pressure, baseline and then 1.38, CKD stage III not a candidate to further escalate her medications with ACE inhibitor/ARB's. Risk for UTI limits use of SGLT2 inhibitors.

## 2023-04-27 NOTE — Progress Notes (Signed)
 Cardiology Consultation:    Date:  04/27/2023   ID:  Sharon Wise, DOB [birth year], MRN [id number]  PCP:  Sharon Fusi, MD  Cardiologist:  Sharon Corporal Megean Fabio, MD   Referring MD: Sharon Fusi, MD   No chief complaint on file.    ASSESSMENT AND PLAN:   Ms. Sharon Wise 73 year old history of paroxysmal atrial fibrillation with 1% burden on heart monitor January 2025 remains on anticoagulation with Eliquis, mild nonobstructive CAD per cardiac CT imaging May 2022 and no ischemia on Lexiscan stress test with nuclear imaging May 2023, hypertension, Alzheimer's disease, CKD stage III, frequent supraventricular ectopic beats 9.4% burden on heart monitor January 2025.  With normal biventricular function on echocardiogram March 2024 with LVEF 55 to 60% and mildly elevated RVSP 43 mmHg and moderate TR. here for routine follow-up visit overall doing well.  Problem List Items Addressed This Visit     Hyperlipidemia   Remains on pravastatin 20 mg daily. Last lipid panel 2024 total cholesterol 181, HDL 89, LDL 81, triglycerides 56. Continue with current dose.       Relevant Medications   torsemide (DEMADEX) 20 MG tablet   Other Relevant Orders   EKG 12-Lead (Completed)   Hypertension   Well-controlled.  She is on a small dose of Imdur 30 mg once daily for angina.  Otherwise no other significant blood pressure lowering medications. Continue target below 140/90 mmHg.       Relevant Medications   torsemide (DEMADEX) 20 MG tablet   Paroxysmal atrial fibrillation (HCC)   Diagnosed in January 2025 on heart monitor, 1% A-fib burden. CHA2DS2-VASc score of 4. On anticoagulation with Eliquis 5 mg twice daily. Tolerating well. No bleeding concerns.  Notes she mention little blood in stools after significant straining secondary to constipation.  This was brief. Mentions had colonoscopy and EGD testing in the past year without any significant abnormalities.  Remains in sinus rhythm  today by twelve-lead EKG. Remains asymptomatic.       Relevant Medications   torsemide (DEMADEX) 20 MG tablet   Chronic diastolic CHF (congestive heart failure) (HCC) - Primary   Normal biventricular function on prior echocardiogram March 2024 with LVEF 55 to 60% and RVSP 43 mmHg with moderate TR. Appears compensated euvolemic.  In fact may be slightly volume depleted. Has lost 6- 7 pounds in the past 1 month. She is good about salt restriction. Lowered the dose of torsemide to 20 mg every other day rather than daily.  With relatively soft blood pressure, baseline and then 1.38, CKD stage III not a candidate to further escalate her medications with ACE inhibitor/ARB's. Risk for UTI limits use of SGLT2 inhibitors.      Relevant Medications   torsemide (DEMADEX) 20 MG tablet   Return to clinic tentatively in 6 months.   History of Present Illness:    Sharon Wise is a 73 y.o. female who is being seen today for follow-up visit. PCP is Sharon Fusi, MD. Last visit with cardiology was 03-16-2023 with Sharon Bamberg, NP-C.  History of paroxysmal atrial fibrillation with 1% burden on heart monitor January 2025 remains on anticoagulation with Eliquis, mild nonobstructive CAD per cardiac CT imaging May 2022 and no ischemia on Lexiscan stress test with nuclear imaging May 2023, hypertension, Alzheimer's disease, CKD stage III, frequent supraventricular ectopic beats 9.4% burden on heart monitor January 2025.  With normal biventricular function on echocardiogram March 2024 with LVEF 55 to 60% and mildly elevated RVSP 43 mmHg  and moderate TR.  At last office visit concern was with regards to slow heart rates and heart monitor was arranged.  However this has not been completed.  She denies any dizziness, lightheadedness, syncopal episodes or falls. Denies any chest pain, shortness of breath.  Her major concern is lack of appetite and she has lost few pounds, about 6-7 since her last  visit in February.  Mentions he is following up with gastroenterologist.  Taking her medications consistently. Uses midodrine as needed for any significant drop in blood pressures where she feels lightheaded.  Mentions has not been using it regularly. Continues to take torsemide 20 mg once a day. No significant pedal edema.  EKG in the clinic today shows sinus rhythm heart rate 74/min, PR interval 180 ms, frequent atrial ectopic beats noted.  Nonspecific ST-T changes.  In comparison to February 25 EKG no significant change.  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   Hyperlipidemia 05/13/2018   Hypertension    Hypertensive chronic kidney disease 05/12/2016   Hypokalemia 12/23/2020   Impingement syndrome of right shoulder 03/31/2016   Late onset Alzheimer's disease without behavioral disturbance (HCC) 04/03/2015   Leg swelling 04/28/2022   Lumbar spondylosis 11/09/2018   Metabolic bone disease 04/23/2015   Pain due to internal orthopedic prosthetic devices, implants and grafts, subsequent encounter 07/09/2017   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) 12/04/2015   Premature contractions, atrial 06/03/2020   Presence of left artificial knee joint 07/09/2017   Primary osteoarthritis of both knees 12/03/2015   Primary osteoarthritis of  right knee 12/04/2015   Renal insufficiency 06/03/2020   Restless leg syndrome 08/11/2021   Tremor    Vascular dementia (HCC) 11/06/2019   Vitamin D deficiency 03/28/2015    Past Surgical History:  Procedure Laterality Date   CARPAL TUNNEL RELEASE     FOOT SURGERY     JOINT REPLACEMENT     REPLACEMENT UNICONDYLAR JOINT KNEE     VAGINAL HYSTERECTOMY      Current Medications: Current Meds  Medication Sig   allopurinol (ZYLOPRIM) 100 MG tablet Take 100 mg by mouth daily.   apixaban (ELIQUIS) 5 MG TABS tablet TAKE ONE (1) TABLET BY MOUTH TWICE DAILY *NEW PRESCRIPTION REQUEST*   cetirizine (ZYRTEC) 10 MG tablet Take 10 mg by mouth daily.   Cholecalciferol 50 MCG (2000 UT) TABS Take 1 tablet by mouth daily.   citalopram (CELEXA) 20 MG tablet Take 20 mg by mouth daily.   clonazePAM (KLONOPIN) 0.5 MG tablet Take 0.5 mg by mouth 2 (two) times daily.    donepezil (ARICEPT ODT) 10 MG disintegrating tablet Take 10 mg by mouth daily.   FEROSUL 325 (65 Fe) MG tablet Take 325 mg by mouth daily.   fluticasone (FLONASE) 50 MCG/ACT nasal spray Place 1 spray into  both nostrils 2 (two) times daily.   isosorbide mononitrate (IMDUR) 30 MG 24 hr tablet Take 1 tablet (30 mg total) by mouth daily.   ketoconazole (NIZORAL) 2 % cream Apply 1 fingertip amount to each foot daily.   ketorolac (TORADOL) 10 MG tablet Take 10 mg by mouth every 6 (six) hours as needed for moderate pain (pain score 4-6) or severe pain (pain score 7-10).   memantine (NAMENDA) 10 MG tablet Take 10 mg by mouth daily.   midodrine (PROAMATINE) 5 MG tablet Take 5 mg by mouth 3 (three) times daily.   nitroGLYCERIN (NITROSTAT) 0.4 MG SL tablet Place 0.4 mg under the tongue every 5 (five) minutes as needed for chest pain.   oxybutynin (DITROPAN-XL) 5 MG 24 hr tablet Take 5 mg by mouth daily.   pantoprazole (PROTONIX) 40 MG tablet Take 40 mg by mouth daily.   potassium chloride SA (K-DUR) 20 MEQ tablet Take 20 mEq by mouth every evening.    pravastatin (PRAVACHOL) 40 MG tablet Take 20 mg by mouth daily.    rOPINIRole (REQUIP) 2 MG tablet Take 2 mg by mouth 2 (two) times daily.   sucralfate (CARAFATE) 1 g tablet Take 1 g by mouth 4 (four) times daily.   traZODone (DESYREL) 50 MG tablet Take 50 mg by mouth at bedtime.   TRELEGY ELLIPTA 100-62.5-25 MCG/ACT AEPB Inhale 1 puff into the lungs daily.   triamcinolone ointment (KENALOG) 0.1 % Apply 1 application topically as needed (rash).   Wheat Dextrin (BENEFIBER) POWD Take 1 (one) tablespoon(s) daily   [DISCONTINUED] torsemide (DEMADEX) 20 MG tablet Take 20 mg by mouth daily.     Allergies:   Meloxicam   Social History   Socioeconomic History   Marital status: Divorced    Spouse name: Not on file   Number of children: Not on file   Years of education: Not on file   Highest education level: Not on file  Occupational History   Not on file  Tobacco Use   Smoking status: Never   Smokeless tobacco: Never  Vaping Use   Vaping status: Never Used  Substance and Sexual Activity   Alcohol use: Never   Drug use: Never   Sexual activity: Not Currently  Other Topics Concern   Not on file  Social History Narrative   Not on file   Social Drivers of Health   Financial Resource Strain: Not on file  Food Insecurity: Not on file  Transportation Needs: Not on file  Physical Activity: Not on file  Stress: Not on file  Social Connections: Not on file     Family History: The patient's family history includes Cancer in her sister and sister; Heart disease in her father and mother; Hypertension in her mother. ROS:   Please see the history of present illness.    All 14 point review of systems negative except as described per history of present illness.  EKGs/Labs/Other Studies Reviewed:    The following studies were reviewed today:   EKG:  EKG Interpretation Date/Time:  Tuesday April 27 2023 14:22:12 EDT Ventricular Rate:  74 PR Interval:  180 QRS Duration:  80 QT  Interval:  392 QTC Calculation: 435 R Axis:   30  Text Interpretation: Sinus rhythm with Premature atrial complexes Cannot rule out Anterior infarct , age undetermined Abnormal ECG When compared with ECG of 16-Mar-2023 09:55, Premature atrial complexes are now Present Confirmed by Huntley Dec reddy [phone]) on 04/27/2023 2:34:14 PM    Recent Labs:  02/08/2023: ALT 11; BUN 13; Creatinine, Ser 1.32; Hemoglobin 11.4; Platelets 210; Potassium 3.9; Sodium 147  Recent Lipid Panel    Component Value Date/Time   CHOL  05/01/2008 0810    155        ATP III CLASSIFICATION:  <200     mg/dL   Desirable  865-784  mg/dL   Borderline High  >=696    mg/dL   High          TRIG 295 05/01/2008 1826   HDL 55 05/01/2008 0810   CHOLHDL 2.8 05/01/2008 0810   VLDL 22 05/01/2008 0810   LDLCALC  05/01/2008 0810    78        Total Cholesterol/HDL:CHD Risk Coronary Heart Disease Risk Table                     Men   Women  1/2 Average Risk   3.4   3.3  Average Risk       5.0   4.4  2 X Average Risk   9.6   7.1  3 X Average Risk  23.4   11.0        Use the calculated Patient Ratio above and the CHD Risk Table to determine the patient's CHD Risk.        ATP III CLASSIFICATION (LDL):  <100     mg/dL   Optimal  284-132  mg/dL   Near or Above                    Optimal  130-159  mg/dL   Borderline  440-102  mg/dL   High  >725     mg/dL   Very High    Physical Exam:    VS:  BP 118/64   Pulse 74   Ht 5\' 2"  (1.575 m)   Wt 124 lb (56.2 kg)   LMP  (LMP Unknown)   SpO2 97%   BMI 22.68 kg/m     Wt Readings from Last 3 Encounters:  04/27/23 124 lb (56.2 kg)  03/16/23 131 lb (59.4 kg)  02/08/23 133 lb (60.3 kg)     GENERAL:  Well nourished, well developed in no acute distress NECK: No JVD; No carotid bruits CARDIAC: RRR, S1 and S2 present, no murmurs, no rubs, no gallops CHEST:  Clear to auscultation without rales, wheezing or rhonchi  Extremities: No pitting pedal edema. Pulses bilaterally  symmetric with radial 2+ and dorsalis pedis 2+ NEUROLOGIC:  Alert and oriented x 3  Medication Adjustments/Labs and Tests Ordered: Current medicines are reviewed at length with the patient today.  Concerns regarding medicines are outlined above.  Orders Placed This Encounter  Procedures   EKG 12-Lead   Meds ordered this encounter  Medications   torsemide (DEMADEX) 20 MG tablet    Sig: Take 1 tablet (20 mg total) by mouth every other day.    Dispense:  45 tablet    Refill:  2    Signed, Rachard Isidro reddy Lucina Betty, MD, MPH, [facility]. 04/27/2023 2:58 PM    [facility]

## 2023-04-27 NOTE — Assessment & Plan Note (Signed)
 Diagnosed in January 2025 on heart monitor, 1% A-fib burden. CHA2DS2-VASc score of 4. On anticoagulation with Eliquis 5 mg twice daily. Tolerating well. No bleeding concerns.  Notes she mention little blood in stools after significant straining secondary to constipation.  This was brief. Mentions had colonoscopy and EGD testing in the past year without any significant abnormalities.  Remains in sinus rhythm today by twelve-lead EKG. Remains asymptomatic.

## 2023-05-24 DIAGNOSIS — S199XXA Unspecified injury of neck, initial encounter: Secondary | ICD-10-CM | POA: Diagnosis not present

## 2023-05-24 DIAGNOSIS — M542 Cervicalgia: Secondary | ICD-10-CM | POA: Diagnosis not present

## 2023-05-24 DIAGNOSIS — Z743 Need for continuous supervision: Secondary | ICD-10-CM | POA: Diagnosis not present

## 2023-05-24 DIAGNOSIS — I499 Cardiac arrhythmia, unspecified: Secondary | ICD-10-CM | POA: Diagnosis not present

## 2023-05-24 DIAGNOSIS — R519 Headache, unspecified: Secondary | ICD-10-CM | POA: Diagnosis not present

## 2023-05-24 DIAGNOSIS — S1091XA Abrasion of unspecified part of neck, initial encounter: Secondary | ICD-10-CM | POA: Diagnosis not present

## 2023-05-24 DIAGNOSIS — S3993XA Unspecified injury of pelvis, initial encounter: Secondary | ICD-10-CM | POA: Diagnosis not present

## 2023-05-24 DIAGNOSIS — S299XXA Unspecified injury of thorax, initial encounter: Secondary | ICD-10-CM | POA: Diagnosis not present

## 2023-05-24 DIAGNOSIS — Z940 Kidney transplant status: Secondary | ICD-10-CM | POA: Diagnosis not present

## 2023-05-24 DIAGNOSIS — S0990XA Unspecified injury of head, initial encounter: Secondary | ICD-10-CM | POA: Diagnosis not present

## 2023-05-24 DIAGNOSIS — M549 Dorsalgia, unspecified: Secondary | ICD-10-CM | POA: Diagnosis not present

## 2023-05-24 DIAGNOSIS — R001 Bradycardia, unspecified: Secondary | ICD-10-CM | POA: Diagnosis not present

## 2023-05-24 DIAGNOSIS — R079 Chest pain, unspecified: Secondary | ICD-10-CM | POA: Diagnosis not present

## 2023-05-24 DIAGNOSIS — R0789 Other chest pain: Secondary | ICD-10-CM | POA: Diagnosis not present

## 2023-05-24 DIAGNOSIS — S3991XA Unspecified injury of abdomen, initial encounter: Secondary | ICD-10-CM | POA: Diagnosis not present

## 2023-05-24 DIAGNOSIS — R9431 Abnormal electrocardiogram [ECG] [EKG]: Secondary | ICD-10-CM | POA: Diagnosis not present

## 2023-05-24 DIAGNOSIS — R6889 Other general symptoms and signs: Secondary | ICD-10-CM | POA: Diagnosis not present

## 2023-05-28 DIAGNOSIS — S20312A Abrasion of left front wall of thorax, initial encounter: Secondary | ICD-10-CM | POA: Diagnosis not present

## 2023-05-28 DIAGNOSIS — R0789 Other chest pain: Secondary | ICD-10-CM | POA: Diagnosis not present

## 2023-05-31 DIAGNOSIS — M47816 Spondylosis without myelopathy or radiculopathy, lumbar region: Secondary | ICD-10-CM | POA: Diagnosis not present

## 2023-05-31 DIAGNOSIS — G2581 Restless legs syndrome: Secondary | ICD-10-CM | POA: Diagnosis not present

## 2023-05-31 DIAGNOSIS — F01A0 Vascular dementia, mild, without behavioral disturbance, psychotic disturbance, mood disturbance, and anxiety: Secondary | ICD-10-CM | POA: Diagnosis not present

## 2023-05-31 DIAGNOSIS — N1832 Chronic kidney disease, stage 3b: Secondary | ICD-10-CM | POA: Diagnosis not present

## 2023-05-31 DIAGNOSIS — G252 Other specified forms of tremor: Secondary | ICD-10-CM | POA: Diagnosis not present

## 2023-05-31 DIAGNOSIS — D631 Anemia in chronic kidney disease: Secondary | ICD-10-CM | POA: Diagnosis not present

## 2023-06-29 DIAGNOSIS — M5442 Lumbago with sciatica, left side: Secondary | ICD-10-CM | POA: Diagnosis not present

## 2023-06-29 DIAGNOSIS — I10 Essential (primary) hypertension: Secondary | ICD-10-CM | POA: Diagnosis not present

## 2023-06-29 DIAGNOSIS — G8929 Other chronic pain: Secondary | ICD-10-CM | POA: Diagnosis not present

## 2023-06-29 DIAGNOSIS — E559 Vitamin D deficiency, unspecified: Secondary | ICD-10-CM | POA: Diagnosis not present

## 2023-06-29 DIAGNOSIS — D638 Anemia in other chronic diseases classified elsewhere: Secondary | ICD-10-CM | POA: Diagnosis not present

## 2023-06-29 DIAGNOSIS — M1712 Unilateral primary osteoarthritis, left knee: Secondary | ICD-10-CM | POA: Diagnosis not present

## 2023-06-29 DIAGNOSIS — R7303 Prediabetes: Secondary | ICD-10-CM | POA: Diagnosis not present

## 2023-06-29 DIAGNOSIS — Z79891 Long term (current) use of opiate analgesic: Secondary | ICD-10-CM | POA: Diagnosis not present

## 2023-06-29 DIAGNOSIS — N183 Chronic kidney disease, stage 3 unspecified: Secondary | ICD-10-CM | POA: Diagnosis not present

## 2023-06-29 DIAGNOSIS — M5441 Lumbago with sciatica, right side: Secondary | ICD-10-CM | POA: Diagnosis not present

## 2023-06-29 DIAGNOSIS — G894 Chronic pain syndrome: Secondary | ICD-10-CM | POA: Diagnosis not present

## 2023-06-29 DIAGNOSIS — E7800 Pure hypercholesterolemia, unspecified: Secondary | ICD-10-CM | POA: Diagnosis not present

## 2023-07-01 DIAGNOSIS — H524 Presbyopia: Secondary | ICD-10-CM | POA: Diagnosis not present

## 2023-07-08 ENCOUNTER — Ambulatory Visit (INDEPENDENT_AMBULATORY_CARE_PROVIDER_SITE_OTHER): Admitting: Podiatry

## 2023-07-08 DIAGNOSIS — M79674 Pain in right toe(s): Secondary | ICD-10-CM | POA: Diagnosis not present

## 2023-07-08 DIAGNOSIS — M79675 Pain in left toe(s): Secondary | ICD-10-CM

## 2023-07-08 DIAGNOSIS — B351 Tinea unguium: Secondary | ICD-10-CM | POA: Diagnosis not present

## 2023-07-08 NOTE — Progress Notes (Unsigned)
 Nails x 10.  She does have hallux valgus with bunion.  She did have a secondary foot issue but was asked to reschedule so we can x-ray and evaluate.

## 2023-07-09 DIAGNOSIS — M47816 Spondylosis without myelopathy or radiculopathy, lumbar region: Secondary | ICD-10-CM | POA: Diagnosis not present

## 2023-07-09 DIAGNOSIS — K5900 Constipation, unspecified: Secondary | ICD-10-CM | POA: Diagnosis not present

## 2023-07-16 ENCOUNTER — Ambulatory Visit: Admitting: Podiatry

## 2023-07-16 ENCOUNTER — Ambulatory Visit (INDEPENDENT_AMBULATORY_CARE_PROVIDER_SITE_OTHER)

## 2023-07-16 DIAGNOSIS — M19072 Primary osteoarthritis, left ankle and foot: Secondary | ICD-10-CM | POA: Diagnosis not present

## 2023-07-16 DIAGNOSIS — M7752 Other enthesopathy of left foot: Secondary | ICD-10-CM | POA: Diagnosis not present

## 2023-07-16 DIAGNOSIS — M722 Plantar fascial fibromatosis: Secondary | ICD-10-CM | POA: Diagnosis not present

## 2023-07-16 DIAGNOSIS — M76822 Posterior tibial tendinitis, left leg: Secondary | ICD-10-CM

## 2023-07-16 MED ORDER — METHYLPREDNISOLONE 4 MG PO TBPK
ORAL_TABLET | ORAL | 0 refills | Status: DC
Start: 2023-07-16 — End: 2023-10-25

## 2023-07-16 NOTE — Progress Notes (Unsigned)
 Chief Complaint  Patient presents with   Foot Problem    History of bunions on left foot as well as gout. Had surgery previously. Causing her pain now. Unable to walk without shoes on, has to wear special shoes or it is worse.    HPI: 73 y.o. female presents today with concern of pain in the left plantar medial heel and along the medial rear foot area.  Denies injury.  States this has been going on for a long time.  States that it occasionally will feel swollen.  She does acknowledge that she does have flatter foot type.  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   Hyperlipidemia 05/13/2018   Hypertension    Hypertensive chronic kidney disease 05/12/2016   Hypokalemia 12/23/2020   Impingement syndrome of right shoulder 03/31/2016   Late onset Alzheimer's disease without behavioral disturbance (HCC) 04/03/2015   Leg swelling 04/28/2022   Lumbar spondylosis 11/09/2018   Metabolic bone disease 04/23/2015   Pain due to internal orthopedic prosthetic devices, implants and grafts, subsequent encounter 07/09/2017   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) 12/04/2015   Premature contractions, atrial 06/03/2020   Presence of left artificial knee joint 07/09/2017   Primary osteoarthritis of both knees 12/03/2015   Primary osteoarthritis  of right knee 12/04/2015   Renal insufficiency 06/03/2020   Restless leg syndrome 08/11/2021   Tremor    Vascular dementia (HCC) 11/06/2019   Vitamin D deficiency 03/28/2015   Past Surgical History:  Procedure Laterality Date   CARPAL TUNNEL RELEASE     FOOT SURGERY     JOINT REPLACEMENT     REPLACEMENT UNICONDYLAR JOINT KNEE     VAGINAL HYSTERECTOMY     Allergies  Allergen Reactions   Meloxicam     Pt can not take due to kidney concerns   Methotrexate Other (See Comments)   Nsaids Other (See Comments)    Physical Exam: Palpable pedal pulses.  There is pain on palpation to the plantar medial aspect of the medial plantar fascial band near the origin of the calcaneus as well as the distal portion of the posterior tibial tendon from the inferior malleolus on the medial aspect to the insertion into the navicular.  No gaps or nodules are noted within the tendon.  Resisted supination reproduces pain.  Minimal localized edema in the area.  No erythema or ecchymosis.  There is slight medial collapse of the rear foot in resting calcaneal stance position.  Manual muscle testing 5/5.  Epicritic sensation intact  Radiographic Exam (left foot, 3 weightbearing views, 07/16/2023):  Mild decreased osseous mineralization. No fractures noted.  Decreased calcaneal inclination angle and increased talar declination angle on lateral view.  There is a tissue anchor in the plantar aspect of the navicular.  There is  evidence of previous bunionectomy with possible tibial sesamoid resection as this is not present on x-ray.  First intermetatarsal angle is 15.  There is an increased hallux abductus angle.  There is significant joint space narrowing at the first MPJ.  There are degenerative joint changes across the dorsal aspect of the tarsometatarsal and midtarsal joints.  Assessment/Plan of Care: 1. Posterior tibial tendinitis, left   2. Capsulitis of metatarsophalangeal (MTP) joint of left foot   3. Plantar  fasciitis of left foot   4. Arthritis of left foot      Meds ordered this encounter  Medications   methylPREDNISolone  (MEDROL  DOSEPAK) 4 MG TBPK tablet    Sig: Take as directed    Dispense:  21 tablet    Refill:  0   Recommended either power step inserts or a speed brace which would provide more support and structure for the posterior tibial tendinitis.  She did not like the feeling from the speed brace but really liked the power step inserts.  So she will wear the power steps at all times weightbearing.  Will send in a Medrol  Dosepak to calm down any pain and inflammation that she is having in these areas.  Recheck in 3 to 4 weeks   Jarid Sasso D. Dhruva Orndoff, DPM, FACFAS [facility]     [address] [place], [place] [address]                Office [phone]  Fax [phone]

## 2023-07-22 ENCOUNTER — Encounter: Payer: Self-pay | Admitting: *Deleted

## 2023-07-22 DIAGNOSIS — R413 Other amnesia: Secondary | ICD-10-CM

## 2023-07-22 DIAGNOSIS — K219 Gastro-esophageal reflux disease without esophagitis: Secondary | ICD-10-CM

## 2023-07-22 DIAGNOSIS — Z79891 Long term (current) use of opiate analgesic: Secondary | ICD-10-CM

## 2023-07-22 DIAGNOSIS — E669 Obesity, unspecified: Secondary | ICD-10-CM

## 2023-07-22 DIAGNOSIS — R7303 Prediabetes: Secondary | ICD-10-CM

## 2023-07-22 DIAGNOSIS — R202 Paresthesia of skin: Secondary | ICD-10-CM

## 2023-07-22 DIAGNOSIS — J343 Hypertrophy of nasal turbinates: Secondary | ICD-10-CM | POA: Diagnosis not present

## 2023-07-22 DIAGNOSIS — H9201 Otalgia, right ear: Secondary | ICD-10-CM | POA: Diagnosis not present

## 2023-07-22 DIAGNOSIS — J45909 Unspecified asthma, uncomplicated: Secondary | ICD-10-CM

## 2023-07-22 DIAGNOSIS — K5730 Diverticulosis of large intestine without perforation or abscess without bleeding: Secondary | ICD-10-CM

## 2023-07-22 DIAGNOSIS — F0280 Dementia in other diseases classified elsewhere without behavioral disturbance: Secondary | ICD-10-CM

## 2023-07-22 DIAGNOSIS — G250 Essential tremor: Secondary | ICD-10-CM

## 2023-07-22 DIAGNOSIS — M109 Gout, unspecified: Secondary | ICD-10-CM

## 2023-07-22 DIAGNOSIS — R531 Weakness: Secondary | ICD-10-CM

## 2023-07-22 DIAGNOSIS — H9191 Unspecified hearing loss, right ear: Secondary | ICD-10-CM | POA: Diagnosis not present

## 2023-07-22 DIAGNOSIS — G479 Sleep disorder, unspecified: Secondary | ICD-10-CM

## 2023-07-22 HISTORY — DX: Long term (current) use of opiate analgesic: Z79.891

## 2023-07-22 HISTORY — DX: Gout, unspecified: M10.9

## 2023-07-22 HISTORY — DX: Weakness: R53.1

## 2023-07-22 HISTORY — DX: Paresthesia of skin: R20.2

## 2023-07-22 HISTORY — DX: Other amnesia: R41.3

## 2023-07-22 HISTORY — DX: Diverticulosis of large intestine without perforation or abscess without bleeding: K57.30

## 2023-07-22 HISTORY — DX: Essential tremor: G25.0

## 2023-07-22 HISTORY — DX: Unspecified asthma, uncomplicated: J45.909

## 2023-07-22 HISTORY — DX: Gastro-esophageal reflux disease without esophagitis: K21.9

## 2023-07-22 HISTORY — DX: Dementia in other diseases classified elsewhere, unspecified severity, without behavioral disturbance, psychotic disturbance, mood disturbance, and anxiety: F02.80

## 2023-07-22 HISTORY — DX: Sleep disorder, unspecified: G47.9

## 2023-07-22 HISTORY — DX: Prediabetes: R73.03

## 2023-07-22 HISTORY — DX: Obesity, unspecified: E66.9

## 2023-07-26 ENCOUNTER — Other Ambulatory Visit: Payer: Self-pay | Admitting: *Deleted

## 2023-07-26 DIAGNOSIS — M542 Cervicalgia: Secondary | ICD-10-CM | POA: Diagnosis not present

## 2023-07-26 DIAGNOSIS — I480 Paroxysmal atrial fibrillation: Secondary | ICD-10-CM

## 2023-07-26 MED ORDER — APIXABAN 5 MG PO TABS: 5.0000 mg | ORAL_TABLET | Freq: Two times a day (BID) | ORAL | 1 refills | Status: DC

## 2023-07-26 NOTE — Telephone Encounter (Signed)
 Eliquis  5mg  refill request received. Patient is 73 years old, weight-56.2kg, Crea-1.32 on 02/08/23, Diagnosis-Afib, and last seen by Dr. Liborio on 04/27/23. Dose is appropriate based on dosing criteria. Will send in refill to requested pharmacy.

## 2023-07-30 DIAGNOSIS — F331 Major depressive disorder, recurrent, moderate: Secondary | ICD-10-CM | POA: Diagnosis not present

## 2023-07-30 DIAGNOSIS — Z1231 Encounter for screening mammogram for malignant neoplasm of breast: Secondary | ICD-10-CM | POA: Diagnosis not present

## 2023-08-02 DIAGNOSIS — M898X9 Other specified disorders of bone, unspecified site: Secondary | ICD-10-CM | POA: Diagnosis not present

## 2023-08-02 DIAGNOSIS — N183 Chronic kidney disease, stage 3 unspecified: Secondary | ICD-10-CM | POA: Diagnosis not present

## 2023-08-02 DIAGNOSIS — E559 Vitamin D deficiency, unspecified: Secondary | ICD-10-CM | POA: Diagnosis not present

## 2023-08-02 DIAGNOSIS — N1832 Chronic kidney disease, stage 3b: Secondary | ICD-10-CM | POA: Diagnosis not present

## 2023-08-02 DIAGNOSIS — N3941 Urge incontinence: Secondary | ICD-10-CM | POA: Diagnosis not present

## 2023-08-02 DIAGNOSIS — D631 Anemia in chronic kidney disease: Secondary | ICD-10-CM | POA: Diagnosis not present

## 2023-08-03 DIAGNOSIS — Z79891 Long term (current) use of opiate analgesic: Secondary | ICD-10-CM | POA: Diagnosis not present

## 2023-08-03 DIAGNOSIS — G4733 Obstructive sleep apnea (adult) (pediatric): Secondary | ICD-10-CM | POA: Diagnosis not present

## 2023-08-03 DIAGNOSIS — M1712 Unilateral primary osteoarthritis, left knee: Secondary | ICD-10-CM | POA: Diagnosis not present

## 2023-08-03 DIAGNOSIS — M5441 Lumbago with sciatica, right side: Secondary | ICD-10-CM | POA: Diagnosis not present

## 2023-08-03 DIAGNOSIS — M5442 Lumbago with sciatica, left side: Secondary | ICD-10-CM | POA: Diagnosis not present

## 2023-08-03 DIAGNOSIS — G894 Chronic pain syndrome: Secondary | ICD-10-CM | POA: Diagnosis not present

## 2023-08-03 DIAGNOSIS — I10 Essential (primary) hypertension: Secondary | ICD-10-CM | POA: Diagnosis not present

## 2023-08-03 DIAGNOSIS — J4540 Moderate persistent asthma, uncomplicated: Secondary | ICD-10-CM | POA: Diagnosis not present

## 2023-08-03 DIAGNOSIS — G309 Alzheimer's disease, unspecified: Secondary | ICD-10-CM | POA: Diagnosis not present

## 2023-08-03 DIAGNOSIS — G2581 Restless legs syndrome: Secondary | ICD-10-CM | POA: Diagnosis not present

## 2023-08-03 DIAGNOSIS — E7800 Pure hypercholesterolemia, unspecified: Secondary | ICD-10-CM | POA: Diagnosis not present

## 2023-08-03 DIAGNOSIS — J301 Allergic rhinitis due to pollen: Secondary | ICD-10-CM | POA: Diagnosis not present

## 2023-08-03 DIAGNOSIS — K219 Gastro-esophageal reflux disease without esophagitis: Secondary | ICD-10-CM | POA: Diagnosis not present

## 2023-08-03 DIAGNOSIS — E876 Hypokalemia: Secondary | ICD-10-CM | POA: Diagnosis not present

## 2023-08-03 DIAGNOSIS — N183 Chronic kidney disease, stage 3 unspecified: Secondary | ICD-10-CM | POA: Diagnosis not present

## 2023-08-18 DIAGNOSIS — J4540 Moderate persistent asthma, uncomplicated: Secondary | ICD-10-CM | POA: Diagnosis not present

## 2023-08-18 DIAGNOSIS — R059 Cough, unspecified: Secondary | ICD-10-CM | POA: Diagnosis not present

## 2023-08-18 DIAGNOSIS — J301 Allergic rhinitis due to pollen: Secondary | ICD-10-CM | POA: Diagnosis not present

## 2023-08-18 DIAGNOSIS — G4733 Obstructive sleep apnea (adult) (pediatric): Secondary | ICD-10-CM | POA: Diagnosis not present

## 2023-08-25 DIAGNOSIS — M79641 Pain in right hand: Secondary | ICD-10-CM | POA: Diagnosis not present

## 2023-08-25 DIAGNOSIS — G4486 Cervicogenic headache: Secondary | ICD-10-CM | POA: Diagnosis not present

## 2023-08-26 DIAGNOSIS — F331 Major depressive disorder, recurrent, moderate: Secondary | ICD-10-CM | POA: Diagnosis not present

## 2023-09-03 DIAGNOSIS — M5442 Lumbago with sciatica, left side: Secondary | ICD-10-CM | POA: Diagnosis not present

## 2023-09-03 DIAGNOSIS — G2581 Restless legs syndrome: Secondary | ICD-10-CM | POA: Diagnosis not present

## 2023-09-03 DIAGNOSIS — M5441 Lumbago with sciatica, right side: Secondary | ICD-10-CM | POA: Diagnosis not present

## 2023-09-03 DIAGNOSIS — K219 Gastro-esophageal reflux disease without esophagitis: Secondary | ICD-10-CM | POA: Diagnosis not present

## 2023-09-03 DIAGNOSIS — Z79891 Long term (current) use of opiate analgesic: Secondary | ICD-10-CM | POA: Diagnosis not present

## 2023-09-03 DIAGNOSIS — M109 Gout, unspecified: Secondary | ICD-10-CM | POA: Diagnosis not present

## 2023-09-03 DIAGNOSIS — R531 Weakness: Secondary | ICD-10-CM | POA: Diagnosis not present

## 2023-09-03 DIAGNOSIS — M1712 Unilateral primary osteoarthritis, left knee: Secondary | ICD-10-CM | POA: Diagnosis not present

## 2023-09-03 DIAGNOSIS — F419 Anxiety disorder, unspecified: Secondary | ICD-10-CM | POA: Diagnosis not present

## 2023-09-07 DIAGNOSIS — J4540 Moderate persistent asthma, uncomplicated: Secondary | ICD-10-CM | POA: Diagnosis not present

## 2023-09-07 DIAGNOSIS — G2581 Restless legs syndrome: Secondary | ICD-10-CM | POA: Diagnosis not present

## 2023-09-07 DIAGNOSIS — G4733 Obstructive sleep apnea (adult) (pediatric): Secondary | ICD-10-CM | POA: Diagnosis not present

## 2023-09-07 DIAGNOSIS — J301 Allergic rhinitis due to pollen: Secondary | ICD-10-CM | POA: Diagnosis not present

## 2023-09-22 DIAGNOSIS — R262 Difficulty in walking, not elsewhere classified: Secondary | ICD-10-CM | POA: Diagnosis not present

## 2023-09-22 DIAGNOSIS — M26609 Unspecified temporomandibular joint disorder, unspecified side: Secondary | ICD-10-CM | POA: Diagnosis not present

## 2023-09-22 DIAGNOSIS — Z79899 Other long term (current) drug therapy: Secondary | ICD-10-CM | POA: Diagnosis not present

## 2023-09-22 DIAGNOSIS — E1169 Type 2 diabetes mellitus with other specified complication: Secondary | ICD-10-CM | POA: Diagnosis not present

## 2023-09-22 DIAGNOSIS — R350 Frequency of micturition: Secondary | ICD-10-CM | POA: Diagnosis not present

## 2023-09-22 DIAGNOSIS — E785 Hyperlipidemia, unspecified: Secondary | ICD-10-CM | POA: Diagnosis not present

## 2023-09-22 DIAGNOSIS — R600 Localized edema: Secondary | ICD-10-CM | POA: Diagnosis not present

## 2023-09-22 DIAGNOSIS — M6281 Muscle weakness (generalized): Secondary | ICD-10-CM | POA: Diagnosis not present

## 2023-09-30 ENCOUNTER — Ambulatory Visit: Admitting: Podiatry

## 2023-09-30 DIAGNOSIS — M79675 Pain in left toe(s): Secondary | ICD-10-CM

## 2023-09-30 DIAGNOSIS — M79674 Pain in right toe(s): Secondary | ICD-10-CM

## 2023-09-30 DIAGNOSIS — B353 Tinea pedis: Secondary | ICD-10-CM

## 2023-09-30 DIAGNOSIS — M21611 Bunion of right foot: Secondary | ICD-10-CM

## 2023-09-30 DIAGNOSIS — M21612 Bunion of left foot: Secondary | ICD-10-CM | POA: Diagnosis not present

## 2023-09-30 DIAGNOSIS — B351 Tinea unguium: Secondary | ICD-10-CM | POA: Diagnosis not present

## 2023-09-30 MED ORDER — KETOCONAZOLE 2 % EX CREA: TOPICAL_CREAM | CUTANEOUS | 3 refills | Status: AC

## 2023-09-30 NOTE — Progress Notes (Unsigned)
 Subjective:  Patient ID: Sharon Wise, female    DOB: 11-Nov-1950,  MRN: [id number]  Sharon Wise presents to clinic today for:  Chief Complaint  Patient presents with   [facility]    RFC with out callous but has dry skin, she did say the medial side of her feet on the hallux  area are really tender.  Not diabetic , eliquis     Patient notes nails are thick, discolored, elongated and painful in shoegear when trying to ambulate.  She has peeling to the plantar aspect of both feet with occasional itching.  She also notes her bunions give her pain.   PCP is Keren Vicenta BRAVO, MD.  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   Hyperlipidemia 05/13/2018   Hypertension    Hypertensive chronic kidney disease 05/12/2016   Hypokalemia 12/23/2020   Impingement syndrome of right shoulder 03/31/2016   Late onset Alzheimer's disease without behavioral disturbance (HCC) 04/03/2015   Leg swelling 04/28/2022   Lumbar spondylosis 11/09/2018   Metabolic bone disease 04/23/2015   Pain due to internal orthopedic prosthetic devices, implants and grafts, subsequent encounter 07/09/2017   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) 12/04/2015   Premature contractions, atrial 06/03/2020   Presence of left artificial knee joint 07/09/2017   Primary  osteoarthritis of both knees 12/03/2015   Primary osteoarthritis of right knee 12/04/2015   Renal insufficiency 06/03/2020   Restless leg syndrome 08/11/2021   Tremor    Vascular dementia (HCC) 11/06/2019   Vitamin D deficiency 03/28/2015   Past Surgical History:  Procedure Laterality Date   CARPAL TUNNEL RELEASE     FOOT SURGERY     JOINT REPLACEMENT     REPLACEMENT UNICONDYLAR JOINT KNEE     VAGINAL HYSTERECTOMY     Allergies  Allergen Reactions   Meloxicam     Pt can not take due to kidney concerns   Methotrexate Other (See Comments)   Nsaids Other (See Comments)    Review of Systems: Negative except as noted in the HPI.  Objective:  Sharon Wise is a pleasant 73 y.o. female in NAD. AAO x 3.  Vascular Examination: Capillary refill time is 3-5 seconds to toes bilateral. Palpable pedal pulses b/l LE. Digital hair present b/l.  Skin temperature gradient WNL b/l. No varicosities b/l. No cyanosis noted b/l.   Dermatological Examination: Pedal skin with normal turgor, texture and tone b/l. No open wounds. No interdigital macerations b/l. Toenails x10 are 3mm thick, discolored, dystrophic with subungual debris. There is pain with compression of the nail plates.  They are elongated x10.  Diffuse peeling and dry skin to the plantar aspect of both feet.  Interspaces are clear.  Orthopedic examination: There is a bony palpable prominence on the medial aspect the first MPJ  with lateral angulation of the hallux bilateral.  This is consistent with bunion and hallux valgus deformity bilateral.  Pain on palpation of the bunion bump  Assessment/Plan: 1. Pain due to onychomycosis of toenails of both feet   2. Tinea pedis of both feet   3. Bunion, left foot   4. Bunion, right foot     Meds ordered this encounter  Medications   ketoconazole  (NIZORAL ) 2 % cream    Sig: Apply 1 fingertip amount to each foot daily.    Dispense:  30 g    Refill:  3   The mycotic toenails were  sharply debrided x10 with sterile nail nippers and a power debriding burr to decrease bulk/thickness and length.    Will send in ketoconazole  cream for the patient to apply once daily to the plantar aspect of both feet for at least 30 days.  Call if she needs any refills or if there is no improvement.  Discussed cortisone injection to the bunion area if she continues to have pain.  She does not want to proceed with any surgical intervention at this time.  Discussed proper fitting shoe gear with a wider forefoot.  Return in about 3 months (around 12/30/2023) for RFC.   Awanda CHARM Imperial, DPM, FACFAS [facility]     [address] [place], [place] [address]                Office [phone]  Fax [phone]

## 2023-10-14 ENCOUNTER — Ambulatory Visit (INDEPENDENT_AMBULATORY_CARE_PROVIDER_SITE_OTHER): Admitting: Podiatry

## 2023-10-14 DIAGNOSIS — M21612 Bunion of left foot: Secondary | ICD-10-CM | POA: Diagnosis not present

## 2023-10-14 DIAGNOSIS — M76822 Posterior tibial tendinitis, left leg: Secondary | ICD-10-CM

## 2023-10-14 DIAGNOSIS — M21072 Valgus deformity, not elsewhere classified, left ankle: Secondary | ICD-10-CM

## 2023-10-14 DIAGNOSIS — R262 Difficulty in walking, not elsewhere classified: Secondary | ICD-10-CM | POA: Diagnosis not present

## 2023-10-14 DIAGNOSIS — M25572 Pain in left ankle and joints of left foot: Secondary | ICD-10-CM | POA: Diagnosis not present

## 2023-10-14 NOTE — Progress Notes (Addendum)
 Chief Complaint  Patient presents with   Injections    Left foot injection, bunion. Pain is going from bunion down medial side of foot to ankle. Pain with walking.  Not diabetic Eliquis    HPI: 73 y.o. female presents today with concern of left bunion pain.  She is requesting a cortisone injection today.  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   Hyperlipidemia 05/13/2018   Hypertension    Hypertensive chronic kidney disease 05/12/2016   Hypokalemia 12/23/2020   Impingement syndrome of right shoulder 03/31/2016   Late onset Alzheimer's disease without behavioral disturbance (HCC) 04/03/2015   Leg swelling 04/28/2022   Lumbar spondylosis 11/09/2018   Metabolic bone disease 04/23/2015   Pain due to internal orthopedic prosthetic devices, implants and grafts, subsequent encounter 07/09/2017   Pain due to total left knee replacement 12/04/2015   Premature contractions, atrial 06/03/2020   Presence of left artificial knee joint 07/09/2017   Primary osteoarthritis of both knees 12/03/2015   Primary osteoarthritis of right knee 12/04/2015   Renal insufficiency 06/03/2020   Restless leg syndrome 08/11/2021   Tremor    Vascular dementia (HCC) 11/06/2019   Vitamin D deficiency 03/28/2015   Past Surgical History:  Procedure Laterality Date    CARPAL TUNNEL RELEASE     FOOT SURGERY     JOINT REPLACEMENT     REPLACEMENT UNICONDYLAR JOINT KNEE     VAGINAL HYSTERECTOMY     Allergies  Allergen Reactions   Meloxicam     Pt can not take due to kidney concerns   Methotrexate Other (See Comments)   Nsaids Other (See Comments)   Review of Systems  Musculoskeletal:  Positive for joint pain.      Physical Exam: Palpable pedal pulses.  There is pain on palpation and with decreased range of motion of the left first MPJ.  There is medial collapse of the midfoot/rear foot on the left.  Pain on palpation of the posterior tibial tendon but there is no gap or nodules present.  Manual muscle testing 5/5.  Heel is in a valgus position and resting calcaneal stance position.  Epicritic sensation intact  Assessment/Plan of Care: 1. Pain in joint of left foot   2. Bunion, left foot   3. Posterior tibial tendinitis, left   4. Valgus foot deformity, acquired, left   5. Difficulty walking     Reviewed clinical findings with the patient today.  With the patient's verbal consent, and after a sterile skin prep, a corticosteroid injection was adminstered to the left first MPJ.  This consisted of a mixture of 1% lidocaine  plain, 0.5% sensorcaine  plain, and Kenalog -10 for a total of 1.25cc's administered.  Bandaid applied. Patient tolerated this well.    Discussed the valgus position of the rear foot with medial collapse of the midfoot area on  the left.  Since she gets pain along the posterior tibial tendon and there is a lack of support to the area, I feel she would benefit from an [place]  brace or some type of AFO device customized for her foot and ankle.  Will need to get her referred to [facility] for the brace.  At the end of her appointment, she requested a cortisone injection to the right bunion area.  She was informed that we do not have any x-rays of the right foot on file and we need to set her up for x-rays and then evaluate to see if this would be  the best treatment option for her.  She was asked to reschedule for this separate issue.  Follow-up as needed  Awanda CHARM Imperial, DPM, FACFAS [facility]     [address] [place], [place] [address]                Office [phone]  Fax [phone]

## 2023-10-17 NOTE — Addendum Note (Signed)
 Addended byBETHA LOEL LAIS D on: 10/17/2023 04:16 PM   Modules accepted: Orders

## 2023-10-25 ENCOUNTER — Ambulatory Visit

## 2023-10-25 VITALS — BP 140/84 | HR 84 | Ht 62.0 in | Wt 143.2 lb

## 2023-10-25 DIAGNOSIS — R55 Syncope and collapse: Secondary | ICD-10-CM | POA: Diagnosis not present

## 2023-10-25 DIAGNOSIS — I480 Paroxysmal atrial fibrillation: Secondary | ICD-10-CM | POA: Diagnosis not present

## 2023-10-25 DIAGNOSIS — R079 Chest pain, unspecified: Secondary | ICD-10-CM | POA: Diagnosis not present

## 2023-10-25 DIAGNOSIS — I2510 Atherosclerotic heart disease of native coronary artery without angina pectoris: Secondary | ICD-10-CM | POA: Insufficient documentation

## 2023-10-25 DIAGNOSIS — I10 Essential (primary) hypertension: Secondary | ICD-10-CM | POA: Diagnosis not present

## 2023-10-25 DIAGNOSIS — I5032 Chronic diastolic (congestive) heart failure: Secondary | ICD-10-CM

## 2023-10-25 DIAGNOSIS — E782 Mixed hyperlipidemia: Secondary | ICD-10-CM

## 2023-10-25 HISTORY — DX: Atherosclerotic heart disease of native coronary artery without angina pectoris: I25.10

## 2023-10-25 HISTORY — DX: Syncope and collapse: R55

## 2023-10-25 NOTE — Patient Instructions (Addendum)
 Medication Instructions:    STOP: Potassium  BRING ALL PILL BOTTLES TO NEXT APPOINTMENT   Lab Work: None Ordered If you have labs (blood work) drawn today and your tests are completely normal, you will receive your results only by: MyChart Message (if you have MyChart) OR A paper copy in the mail If you have any lab test that is abnormal or we need to change your treatment, we will call you to review the results.   Testing/Procedures:  WHY IS MY DOCTOR PRESCRIBING ZIO? The Zio system is proven and trusted by physicians to detect and diagnose irregular heart rhythms -- and has been prescribed to hundreds of thousands of patients.  The FDA has cleared the Zio system to monitor for many different kinds of irregular heart rhythms. In a study, physicians were able to reach a diagnosis 90% of the time with the Zio system1.  You can wear the Zio monitor -- a small, discreet, comfortable patch -- during your normal day-to-day activity, including while you sleep, shower, and exercise, while it records every single heartbeat for analysis.  1Barrett, P., et al. Comparison of 24 Hour Holter Monitoring Versus 14 Day Novel Adhesive Patch Electrocardiographic Monitoring. American Journal of Medicine, 2014.  ZIO VS. HOLTER MONITORING The Zio monitor can be comfortably worn for up to 14 days. Holter monitors can be worn for 24 to 48 hours, limiting the time to record any irregular heart rhythms you may have. Zio is able to capture data for the 51% of patients who have their first symptom-triggered arrhythmia after 48 hours.1  LIVE WITHOUT RESTRICTIONS The Zio ambulatory cardiac monitor is a small, unobtrusive, and water-resistant patch--you might even forget you're wearing it. The Zio monitor records and stores every beat of your heart, whether you're sleeping, working out, or showering.    Your physician has requested that you have a lexiscan  myoview . For further information please visit  [email or web address] Please follow instruction sheet, as given.  The test will take approximately 3 to 4 hours to complete; you may bring reading material.  If someone comes with you to your appointment, they will need to remain in the main lobby due to limited space in the testing area. **If you are pregnant or breastfeeding, please notify the nuclear lab prior to your appointment**  How to prepare for your Myocardial Perfusion Test: Do not eat or drink 3 hours prior to your test, except you may have water. Do not consume products containing caffeine (regular or decaffeinated) 12 hours prior to your test. (ex: coffee, chocolate, sodas, tea). Do bring a list of your current medications with you.  If not listed below, you may take your medications as normal. Do wear comfortable clothes (no dresses or overalls) and walking shoes, tennis shoes preferred (No heels or open toe shoes are allowed). Do NOT wear cologne, perfume, aftershave, or lotions (deodorant is allowed). If these instructions are not followed, your test will have to be rescheduled.     Follow-Up: At [facility], you and your health needs are our priority.  As part of our continuing mission to provide you with exceptional heart care, we have created designated Provider Care Teams.  These Care Teams include your primary Cardiologist (physician) and Advanced Practice Providers (APPs -  Physician Assistants and Nurse Practitioners) who all work together to provide you with the care you need, when you need it.  We recommend signing up for the patient portal called MyChart.  Sign up information is provided on this After  Visit Summary.  MyChart is used to connect with patients for Virtual Visits (Telemedicine).  Patients are able to view lab/test results, encounter notes, upcoming appointments, etc.  Non-urgent messages can be sent to your provider as well.   To learn more about what you can do with MyChart, go to [email or web address].     Your next appointment:   6 week(s)  The format for your next appointment:   In Person  Provider:   Alean Kobus, MD   Other Instructions NA

## 2023-10-25 NOTE — Assessment & Plan Note (Signed)
 With episode of syncope about 3 days ago as she was walking to a restaurant symptoms preceded by a flutter like sensation in the chest.  Brief loss of consciousness.  Will obtain Zio patch monitor for 14 days to assess if her symptoms are correlating with underlying arrhythmias. If significant 8 we will arrhythmias noted will consider rhythm control with amiodarone.

## 2023-10-25 NOTE — Assessment & Plan Note (Signed)
 A-fib burden about 1% on heart monitor January 2025. Remains on Eliquis  5 mg twice daily.  Will obtain Zio patch again for 14 days given her history of increased flutter sensation in the chest and an episode of syncope about 3 days ago.

## 2023-10-25 NOTE — Progress Notes (Signed)
 Cardiology Consultation:    Date:  10/25/2023   ID:  DAZIYAH COGAN, DOB 10-Oct-1950, MRN [id number]  PCP:  Keren Vicenta BRAVO, MD  Cardiologist:  Alean SAUNDERS Yue Glasheen, MD   Referring MD: Keren Vicenta BRAVO, MD   No chief complaint on file.    ASSESSMENT AND PLAN:   [name] 73 year old  history of paroxysmal atrial fibrillation with 1% burden on heart monitor January 2025 remains on anticoagulation with Eliquis , mild nonobstructive CAD per cardiac CT imaging May 2022 and no ischemia on Lexisc an stress test with nuclear imaging May 2023, hypertension, Alzheimer's disease, CKD stage III, frequent supraventricular ectopic beats 9.4% burden on heart monitor January 2025. With normal biventricular function on echocardiogram March 2024 with LVEF 55 to 60% and mildly elevated RVSP 43 mmHg and moderate TR.  Here for follow-up visit. Mentions an episode of syncope which appears more orthostatic however associated with increase in flutter like sensation in the chest. Also an episode of chest discomfort that was during increased activity and subsided after a sublingual nitroglycerin  pill.   Problem List Items Addressed This Visit     Chest pain of uncertain etiology   Symptoms of chest pain during moderate to heavy exertion. Improved with sublingual nitroglycerin .  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       Relevant Orders   MYOCARDIAL PERFUSION IMAGING   Hyperlipidemia   Can you pravastatin 20 mg once daily. Last lipid panel 11/06/2022 total cholesterol 181, HDL 89, LDL 81, triglycerides 56.      Hypertension   near optimal considering her elderly age and tendency for orthostatic blood pressure changes.        Paroxysmal atrial fibrillation (HCC)   A-fib burden  about 1% on heart monitor January 2025. Remains on Eliquis  5 mg twice daily.  Will obtain Zio patch again for 14 days given her history of increased flutter sensation in the chest and an episode of syncope about 3 days ago.      Chronic diastolic CHF (congestive heart failure) (HCC)   Appears well compensated. Weight has increased since her last visit with us , currently 143 pounds. At last office visit in April was 124 pounds.  No significant signs of fluid buildup, no pedal edema.  Unclear if she is currently taking a diuretic at home. Last nephrology notes from July mentions torsemide  10 mg once daily. She mentions torsemide  was discontinued about a month ago. Advised her to bring her medications to the next office visit tentatively in 6 weeks to review medications accurately.  I have not made any changes to her medications.  With her CKD not a candidate for ACE inhibitors or ARB's for heart failure. Risk of UTIs, not a candidate for SGLT2 inhibitors.        Coronary atherosclerosis   mild nonobstructive CAD per cardiac CT imaging May 2022 and no ischemia on Lexisc an stress test with nuclear imaging May 2023  With episode of chest pain with moderate exertion requiring sublingual nitroglycerin , will further evaluate for any significant ischemia burden with Lexiscan  by perfusion test. Remains on Eliquis  for A-fib stroke prophylaxis, hence not on aspirin. Continues on pravastatin milligrams once daily.      Syncope and collapse - Primary   With episode of syncope about 3 days  ago as she was walking to a restaurant symptoms preceded by a flutter like sensation in the chest.  Brief loss of consciousness.  Will obtain Zio patch monitor for 14 days to assess if her symptoms are correlating with underlying arrhythmias. If significant 8 we will arrhythmias noted will consider rhythm control with amiodarone.      Relevant Orders   LONG TERM MONITOR (3-14 DAYS)   Return to clinic  tentatively in 6 weeks.   History of Present Illness:    Sharon Wise is a 73 y.o. female who is being seen today for follow-up visit. PCP Keren Vicenta BRAVO, MD.  Last visit with me in the office was 04/27/2023. Follows up with nephrologist at [facility] Dr.Lufadeju.  Pleasant woman here for the visit by herself.  Lives in an apartment.  Her younger brother with autism lives with her.  She does not have any children of her home.  She has other siblings who live nearby.  Manages her own affairs and medications.  Unable to fully recollect all the medications that she takes and did not bring any list to the visit today.  Advised her to bring all her medication bottles that she is actively taking to her each office visit with us .  She has a history of paroxysmal atrial fibrillation with 1% burden on heart monitor January 2025 remains on anticoagulation with Eliquis , mild nonobstructive CAD per cardiac CT imaging May 2022 and no ischemia on Lexisc an stress test with nuclear imaging May 2023, hypertension, Alzheimer's disease, CKD stage III, frequent supraventricular ectopic beats 9.4% burden on heart monitor January 2025. With normal biventricular function on echocardiogram March 2024 with LVEF 55 to 60% and mildly elevated RVSP 43 mmHg and moderate TR.  Mentions he does notice fluttering in the chest that can last for minutes to hours occasionally. Recently about 2-3 nights ago as she was getting up to use the restroom she felt lightheaded and she continued to walk she felt weak and fell to the floor and believes she passed out for couple minutes.  Her brother was able to help her up. No further recurrence.  She also describes episode of chest discomfort that felt like pressure that lasted for few minutes and she took a sublingual nitroglycerin  with relief of symptoms.  Mentions that this occurred while she was rushing to do some activities in the house.  No significant symptoms with activities  at a slower pace.  No shortness of breath. No orthopnea. No pedal edema. No blood in urine or stools.  Mentions good compliance with her medications. However she was unable to confirm if she is currently taking midodrine. Similarly she was unable to confirm if she has currently on potassium chloride  and what dose. Mentions torsemide  was discontinued about a month ago.  Blood work from 08/02/2023 notes BUN 30, creatinine 1.41, eGFR 39 Sodium 142, potassium 3.8 CBC with hemoglobin 11.6, hematocrit 34.9, WBC 4, platelets 204.  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   Hyperlipidemia 05/13/2018   Hypertension    Hypertensive chronic kidney disease 05/12/2016   Hypokalemia 12/23/2020   Impingement syndrome of right shoulder 03/31/2016   Late onset Alzheimer's disease without behavioral disturbance (HCC) 04/03/2015   Leg swelling 04/28/2022   Long term (current) use of opiate analgesic 07/22/2023   Lumbar spondylosis 11/09/2018   Memory loss 07/22/2023   Metabolic bone disease  04/23/2015   Obesity 07/22/2023   Pain due to internal orthopedic prosthetic devices, implants and grafts, subsequent encounter 07/09/2017   Pain due to total left knee replacement 12/04/2015   Paresthesia 07/22/2023   Paroxysmal atrial fibrillation (HCC) 04/27/2023   Prediabetes 07/22/2023   Premature contractions, atrial 06/03/2020   Presence of left artificial knee joint 07/09/2017   Primary osteoarthritis of both knees 12/03/2015   Primary osteoarthritis of right knee 12/04/2015   Renal insufficiency 06/03/2020   Restless leg syndrome 08/11/2021   Sleep disorder 07/22/2023   Tremor    Vascular dementia (HCC) 11/06/2019   Vitamin D deficiency 03/28/2015   Weakness 07/22/2023    Past Surgical History:  Procedure Laterality Date   CARPAL TUNNEL RELEASE     FOOT SURGERY     JOINT REPLACEMENT     REPLACEMENT UNICONDYLAR JOINT KNEE     VAGINAL HYSTERECTOMY      Current Medications: Current Meds  Medication Sig   allopurinol (ZYLOPRIM) 100 MG tablet Take 100 mg by mouth daily.   apixaban  (ELIQUIS ) 5 MG TABS tablet Take 1 tablet (5 mg total) by mouth 2 (two) times daily.   cetirizine (ZYRTEC) 10 MG tablet Take 10 mg by mouth daily.   Cholecalciferol 50 MCG (2000 UT) TABS Take 1 tablet by mouth daily.   citalopram (CELEXA) 20 MG tablet Take 20 mg by mouth daily.   clonazePAM (KLONOPIN) 0.5 MG tablet Take 0.5 mg by mouth 2 (two) times daily.    donepezil (ARICEPT ODT) 10 MG disintegrating tablet Take 10 mg by mouth daily.   FEROSUL 325 (65 Fe) MG tablet Take 325 mg by mouth daily.   fluticasone (FLONASE) 50 MCG/ACT nasal spray Place 1 spray into both nostrils 2 (two) times daily.   isosorbide  mononitrate (IMDUR ) 30 MG 24 hr tablet Take 1 tablet (30 mg total) by mouth daily.   ketoconazole  (NIZORAL ) 2 % cream Apply 1 fingertip amount to each foot daily.   ketorolac  (TORADOL ) 10 MG tablet Take 10 mg by mouth every 6 (six) hours as needed for moderate pain (pain score 4-6) or severe  pain (pain score 7-10).   memantine (NAMENDA) 10 MG tablet Take 10 mg by mouth daily.   midodrine (PROAMATINE) 5 MG tablet Take 5 mg by mouth 3 (three) times daily.   nitroGLYCERIN  (NITROSTAT ) 0.4 MG SL tablet Place 0.4 mg under the tongue every 5 (five) minutes as needed for chest pain.   oxybutynin (DITROPAN-XL) 5 MG 24 hr tablet Take 5 mg by mouth daily.   pantoprazole (PROTONIX) 40 MG tablet Take 40 mg by mouth daily.   potassium chloride  SA (K-DUR) 20 MEQ tablet Take 20 mEq by mouth every  evening.   pravastatin (PRAVACHOL) 40 MG tablet Take 20 mg by mouth daily.    pregabalin (LYRICA) 75 MG capsule Take 75 mg by mouth every evening.   rOPINIRole (REQUIP) 2 MG tablet Take 2 mg by mouth 2 (two) times daily.   solifenacin (VESICARE) 5 MG tablet Take 5 mg by mouth daily.   sucralfate (CARAFATE) 1 g tablet Take 1 g by mouth 4 (four) times daily.   torsemide  (DEMADEX ) 20 MG tablet Take 1 tablet (20 mg total) by mouth every other day.   traZODone (DESYREL) 50 MG tablet Take 50 mg by mouth at bedtime.   TRELEGY ELLIPTA 100-62.5-25 MCG/ACT AEPB Inhale 1 puff into the lungs daily.   triamcinolone  ointment (KENALOG ) 0.1 % Apply 1 application topically as needed (rash).   [DISCONTINUED] Wheat Dextrin (BENEFIBER) POWD Take 1 (one) tablespoon(s) daily     Allergies:   Meloxicam, Methotrexate, and Nsaids   Social History   Socioeconomic History   Marital status: Divorced    Spouse name: Not on file   Number of children: Not on file   Years of education: Not on file   Highest education level: Not on file  Occupational History   Not on file  Tobacco Use   Smoking status: Never   Smokeless tobacco: Never  Vaping Use   Vaping status: Never Used  Substance and Sexual Activity   Alcohol use: Never   Drug use: Never   Sexual activity: Not Currently  Other Topics Concern   Not on file  Social History Narrative   Not on file   Social Drivers of Health   Financial Resource Strain: Not on  file  Food Insecurity: Not on file  Transportation Needs: Not on file  Physical Activity: Not on file  Stress: Not on file  Social Connections: Not on file     Family History: The patient's family history includes Cancer in her sister and sister; Heart disease in her father and mother; Hypertension in her mother. ROS:   Please see the history of present illness.    All 14 point review of systems negative except as described per history of present illness.  EKGs/Labs/Other Studies Reviewed:    The following studies were reviewed today:   EKG:       Recent Labs: 02/08/2023: ALT 11; BUN 13; Creatinine, Ser 1.32; Hemoglobin 11.4; Platelets 210; Potassium 3.9; Sodium 147  Recent Lipid Panel    Component Value Date/Time   CHOL  05/01/2008 0810    155        ATP III CLASSIFICATION:  <200     mg/dL   Desirable  799-760  mg/dL   Borderline High  >=759    mg/dL   High          TRIG 884 05/01/2008 1826   HDL 55 05/01/2008 0810   CHOLHDL 2.8 05/01/2008 0810   VLDL 22 05/01/2008 0810   LDLCALC  05/01/2008 0810    78        Total Cholesterol/HDL:CHD Risk Coronary Heart Disease Risk Table                     Men   Women  1/2 Average Risk   3.4   3.3  Average Risk       5.0   4.4  2 X Average Risk   9.6   7.1  3 X Average Risk  23.4   11.0        Use  the calculated Patient Ratio above and the CHD Risk Table to determine the patient's CHD Risk.        ATP III CLASSIFICATION (LDL):  <100     mg/dL   Optimal  899-870  mg/dL   Near or Above                    Optimal  130-159  mg/dL   Borderline  839-810  mg/dL   High  >809     mg/dL   Very High    Physical Exam:    VS:  BP (!) 140/84   Pulse 84   Ht 5' 2 (1.575 m)   Wt 143 lb 3.2 oz (65 kg)   LMP  (LMP Unknown)   SpO2 94%   BMI 26.19 kg/m     Wt Readings from Last 3 Encounters:  10/25/23 143 lb 3.2 oz (65 kg)  04/27/23 124 lb (56.2 kg)  03/16/23 131 lb (59.4 kg)     GENERAL:  Well nourished, well developed in  no acute distress NECK: No JVD; No carotid bruits CARDIAC: RRR, S1 and S2 present, no murmurs, no rubs, no gallops CHEST:  Clear to auscultation without rales, wheezing or rhonchi  Extremities: No pitting pedal edema. Pulses bilaterally symmetric with radial 2+ and dorsalis pedis 2+ NEUROLOGIC:  Alert and oriented x 3  Medication Adjustments/Labs and Tests Ordered: Current medicines are reviewed at length with the patient today.  Concerns regarding medicines are outlined above.  Orders Placed This Encounter  Procedures   LONG TERM MONITOR (3-14 DAYS)   MYOCARDIAL PERFUSION IMAGING   No orders of the defined types were placed in this encounter.   Signed, Jaida Basurto reddy Mikiah Durall, MD, MPH, [facility]. 10/25/2023 2:48 PM    [facility]

## 2023-10-25 NOTE — Assessment & Plan Note (Signed)
 mild nonobstructive CAD per cardiac CT imaging May 2022 and no ischemia on Lexisc an stress test with nuclear imaging May 2023  With episode of chest pain with moderate exertion requiring sublingual nitroglycerin , will further evaluate for any significant ischemia burden with Lexiscan  by perfusion test. Remains on Eliquis  for A-fib stroke prophylaxis, hence not on aspirin. Continues on pravastatin milligrams once daily.

## 2023-10-25 NOTE — Assessment & Plan Note (Signed)
 Symptoms of chest pain during moderate to heavy exertion. Improved with sublingual nitroglycerin .  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

## 2023-10-25 NOTE — Assessment & Plan Note (Signed)
 Can you pravastatin 20 mg once daily. Last lipid panel 11/06/2022 total cholesterol 181, HDL 89, LDL 81, triglycerides 56.

## 2023-10-25 NOTE — Assessment & Plan Note (Addendum)
 Appears well compensated. Weight has increased since her last visit with us , currently 143 pounds. At last office visit in April was 124 pounds.  No significant signs of fluid buildup, no pedal edema.  Unclear if she is currently taking a diuretic at home. Last nephrology notes from July mentions torsemide  10 mg once daily. She mentions torsemide  was discontinued about a month ago. Advised her to bring her medications to the next office visit tentatively in 6 weeks to review medications accurately.  I have not made any changes to her medications.  With her CKD not a candidate for ACE inhibitors or ARB's for heart failure. Risk of UTIs, not a candidate for SGLT2 inhibitors.

## 2023-10-25 NOTE — Assessment & Plan Note (Signed)
 near optimal considering her elderly age and tendency for orthostatic blood pressure changes.

## 2023-10-26 ENCOUNTER — Telehealth (HOSPITAL_COMMUNITY): Payer: Self-pay | Admitting: *Deleted

## 2023-10-26 NOTE — Telephone Encounter (Signed)
 Left message on voicemail per DPR in reference to upcoming appointment scheduled on 10/27/2023 at 8:00 with detailed instructions given per Myocardial Perfusion Study Information Sheet for the test. LM to arrive 15 minutes early, and that it is imperative to arrive on time for appointment to keep from having the test rescheduled. If you need to cancel or reschedule your appointment, please call the office within 24 hours of your appointment. Failure to do so may result in a cancellation of your appointment, and a $50 no show fee. Phone number given for call back for any questions.

## 2023-10-27 ENCOUNTER — Ambulatory Visit

## 2023-10-27 DIAGNOSIS — R079 Chest pain, unspecified: Secondary | ICD-10-CM

## 2023-10-27 MED ORDER — TECHNETIUM TC 99M TETROFOSMIN IV KIT
10.2000 | PACK | Freq: Once | INTRAVENOUS | Status: AC | PRN
  Administered 2023-10-27: 10.2 via INTRAVENOUS

## 2023-10-27 MED ORDER — TECHNETIUM TC 99M TETROFOSMIN IV KIT
28.9000 | PACK | Freq: Once | INTRAVENOUS | Status: AC | PRN
  Administered 2023-10-27: 28.9 via INTRAVENOUS

## 2023-10-27 MED ORDER — REGADENOSON 0.4 MG/5ML IV SOLN
0.4000 mg | Freq: Once | INTRAVENOUS | Status: AC
  Administered 2023-10-27: 0.4 mg via INTRAVENOUS

## 2023-10-28 DIAGNOSIS — M542 Cervicalgia: Secondary | ICD-10-CM | POA: Diagnosis not present

## 2023-10-28 DIAGNOSIS — G5601 Carpal tunnel syndrome, right upper limb: Secondary | ICD-10-CM | POA: Diagnosis not present

## 2023-10-28 LAB — MYOCARDIAL PERFUSION IMAGING
LV dias vol: 65 mL (ref 46–106)
LV sys vol: 18 mL (ref 3.8–5.2)
Nuc Stress EF: 72 %
Peak HR: 86 {beats}/min
Rest HR: 49 {beats}/min
Rest Nuclear Isotope Dose: 10.2 mCi
SDS: 0
SRS: 1
SSS: 1
Stress Nuclear Isotope Dose: 28.9 mCi
TID: 0.9

## 2023-10-29 DIAGNOSIS — Z79899 Other long term (current) drug therapy: Secondary | ICD-10-CM | POA: Diagnosis not present

## 2023-10-29 DIAGNOSIS — N183 Chronic kidney disease, stage 3 unspecified: Secondary | ICD-10-CM | POA: Diagnosis not present

## 2023-10-29 DIAGNOSIS — E119 Type 2 diabetes mellitus without complications: Secondary | ICD-10-CM | POA: Diagnosis not present

## 2023-10-29 DIAGNOSIS — I10 Essential (primary) hypertension: Secondary | ICD-10-CM | POA: Diagnosis not present

## 2023-10-29 DIAGNOSIS — Z7951 Long term (current) use of inhaled steroids: Secondary | ICD-10-CM | POA: Diagnosis not present

## 2023-10-29 DIAGNOSIS — M545 Low back pain, unspecified: Secondary | ICD-10-CM | POA: Diagnosis not present

## 2023-10-29 DIAGNOSIS — M47816 Spondylosis without myelopathy or radiculopathy, lumbar region: Secondary | ICD-10-CM | POA: Diagnosis not present

## 2023-11-03 DIAGNOSIS — M5416 Radiculopathy, lumbar region: Secondary | ICD-10-CM | POA: Diagnosis not present

## 2023-11-03 DIAGNOSIS — M4306 Spondylolysis, lumbar region: Secondary | ICD-10-CM | POA: Diagnosis not present

## 2023-11-03 DIAGNOSIS — G8929 Other chronic pain: Secondary | ICD-10-CM | POA: Diagnosis not present

## 2023-11-03 DIAGNOSIS — M545 Low back pain, unspecified: Secondary | ICD-10-CM | POA: Diagnosis not present

## 2023-11-04 DIAGNOSIS — Z96652 Presence of left artificial knee joint: Secondary | ICD-10-CM | POA: Diagnosis not present

## 2023-11-04 DIAGNOSIS — Z981 Arthrodesis status: Secondary | ICD-10-CM | POA: Diagnosis not present

## 2023-11-04 DIAGNOSIS — M545 Low back pain, unspecified: Secondary | ICD-10-CM | POA: Diagnosis not present

## 2023-11-04 DIAGNOSIS — M47812 Spondylosis without myelopathy or radiculopathy, cervical region: Secondary | ICD-10-CM | POA: Diagnosis not present

## 2023-11-08 DIAGNOSIS — M47812 Spondylosis without myelopathy or radiculopathy, cervical region: Secondary | ICD-10-CM | POA: Diagnosis not present

## 2023-11-08 DIAGNOSIS — M47816 Spondylosis without myelopathy or radiculopathy, lumbar region: Secondary | ICD-10-CM | POA: Diagnosis not present

## 2023-11-08 DIAGNOSIS — M4316 Spondylolisthesis, lumbar region: Secondary | ICD-10-CM | POA: Diagnosis not present

## 2023-11-09 DIAGNOSIS — M47812 Spondylosis without myelopathy or radiculopathy, cervical region: Secondary | ICD-10-CM | POA: Diagnosis not present

## 2023-11-09 DIAGNOSIS — Z981 Arthrodesis status: Secondary | ICD-10-CM | POA: Diagnosis not present

## 2023-11-09 DIAGNOSIS — M47816 Spondylosis without myelopathy or radiculopathy, lumbar region: Secondary | ICD-10-CM | POA: Diagnosis not present

## 2023-11-11 ENCOUNTER — Ambulatory Visit: Admitting: Podiatry

## 2023-11-12 DIAGNOSIS — M25562 Pain in left knee: Secondary | ICD-10-CM | POA: Diagnosis not present

## 2023-11-12 DIAGNOSIS — M47812 Spondylosis without myelopathy or radiculopathy, cervical region: Secondary | ICD-10-CM | POA: Diagnosis not present

## 2023-11-12 DIAGNOSIS — M25462 Effusion, left knee: Secondary | ICD-10-CM | POA: Diagnosis not present

## 2023-11-12 DIAGNOSIS — M545 Low back pain, unspecified: Secondary | ICD-10-CM | POA: Diagnosis not present

## 2023-11-12 DIAGNOSIS — M79662 Pain in left lower leg: Secondary | ICD-10-CM | POA: Diagnosis not present

## 2023-11-12 DIAGNOSIS — Z79899 Other long term (current) drug therapy: Secondary | ICD-10-CM | POA: Diagnosis not present

## 2023-11-15 DIAGNOSIS — R55 Syncope and collapse: Secondary | ICD-10-CM | POA: Diagnosis not present

## 2023-11-16 DIAGNOSIS — R350 Frequency of micturition: Secondary | ICD-10-CM | POA: Diagnosis not present

## 2023-11-17 DIAGNOSIS — D638 Anemia in other chronic diseases classified elsewhere: Secondary | ICD-10-CM | POA: Diagnosis not present

## 2023-11-17 DIAGNOSIS — I10 Essential (primary) hypertension: Secondary | ICD-10-CM | POA: Diagnosis not present

## 2023-11-17 DIAGNOSIS — M5441 Lumbago with sciatica, right side: Secondary | ICD-10-CM | POA: Diagnosis not present

## 2023-11-17 DIAGNOSIS — M5442 Lumbago with sciatica, left side: Secondary | ICD-10-CM | POA: Diagnosis not present

## 2023-11-17 DIAGNOSIS — G2581 Restless legs syndrome: Secondary | ICD-10-CM | POA: Diagnosis not present

## 2023-11-17 DIAGNOSIS — M1712 Unilateral primary osteoarthritis, left knee: Secondary | ICD-10-CM | POA: Diagnosis not present

## 2023-11-17 DIAGNOSIS — E559 Vitamin D deficiency, unspecified: Secondary | ICD-10-CM | POA: Diagnosis not present

## 2023-11-17 DIAGNOSIS — R202 Paresthesia of skin: Secondary | ICD-10-CM | POA: Diagnosis not present

## 2023-11-17 DIAGNOSIS — G8929 Other chronic pain: Secondary | ICD-10-CM | POA: Diagnosis not present

## 2023-11-17 DIAGNOSIS — E876 Hypokalemia: Secondary | ICD-10-CM | POA: Diagnosis not present

## 2023-11-17 DIAGNOSIS — M109 Gout, unspecified: Secondary | ICD-10-CM | POA: Diagnosis not present

## 2023-11-17 DIAGNOSIS — R7303 Prediabetes: Secondary | ICD-10-CM | POA: Diagnosis not present

## 2023-11-19 DIAGNOSIS — R079 Chest pain, unspecified: Secondary | ICD-10-CM | POA: Diagnosis not present

## 2023-11-20 DIAGNOSIS — I440 Atrioventricular block, first degree: Secondary | ICD-10-CM | POA: Diagnosis not present

## 2023-12-06 ENCOUNTER — Ambulatory Visit

## 2023-12-14 ENCOUNTER — Ambulatory Visit: Payer: Self-pay

## 2023-12-14 DIAGNOSIS — R55 Syncope and collapse: Secondary | ICD-10-CM

## 2023-12-30 ENCOUNTER — Encounter: Admitting: Podiatry

## 2023-12-30 NOTE — Progress Notes (Signed)
Patient did not show for scheduled appointment today.

## 2024-01-14 ENCOUNTER — Other Ambulatory Visit: Payer: Self-pay

## 2024-01-14 DIAGNOSIS — I480 Paroxysmal atrial fibrillation: Secondary | ICD-10-CM

## 2024-01-14 MED ORDER — APIXABAN 5 MG PO TABS: 5.0000 mg | ORAL_TABLET | Freq: Two times a day (BID) | ORAL | 1 refills | Status: AC

## 2024-01-14 NOTE — Telephone Encounter (Signed)
 Prescription refill request for Eliquis  received. Indication:afib Last office visit:10/25 Scr: 1.41  7/25 Age:73 Weight:65  kg  Prescription refilled
